# Patient Record
Sex: Female | Born: 1935 | Race: Black or African American | Hispanic: No | Smoking: Never smoker
Health system: Southern US, Community
[De-identification: ages and names within clinical notes are randomized; demographics above are authoritative.]

## PROBLEM LIST (undated history)

## (undated) DIAGNOSIS — D693 Immune thrombocytopenic purpura: Secondary | ICD-10-CM

## (undated) DIAGNOSIS — Z87440 Personal history of urinary (tract) infections: Secondary | ICD-10-CM

## (undated) DIAGNOSIS — I472 Ventricular tachycardia, unspecified: Secondary | ICD-10-CM

## (undated) DIAGNOSIS — I4892 Unspecified atrial flutter: Secondary | ICD-10-CM

## (undated) DIAGNOSIS — D696 Thrombocytopenia, unspecified: Secondary | ICD-10-CM

## (undated) DIAGNOSIS — N189 Chronic kidney disease, unspecified: Secondary | ICD-10-CM

## (undated) DIAGNOSIS — F0390 Unspecified dementia without behavioral disturbance: Secondary | ICD-10-CM

## (undated) DIAGNOSIS — R1310 Dysphagia, unspecified: Secondary | ICD-10-CM

## (undated) DIAGNOSIS — J069 Acute upper respiratory infection, unspecified: Secondary | ICD-10-CM

## (undated) DIAGNOSIS — K219 Gastro-esophageal reflux disease without esophagitis: Secondary | ICD-10-CM

## (undated) DIAGNOSIS — I2510 Atherosclerotic heart disease of native coronary artery without angina pectoris: Secondary | ICD-10-CM

## (undated) DIAGNOSIS — I079 Rheumatic tricuspid valve disease, unspecified: Secondary | ICD-10-CM

## (undated) DIAGNOSIS — I4510 Unspecified right bundle-branch block: Secondary | ICD-10-CM

## (undated) DIAGNOSIS — G459 Transient cerebral ischemic attack, unspecified: Secondary | ICD-10-CM

## (undated) DIAGNOSIS — I219 Acute myocardial infarction, unspecified: Secondary | ICD-10-CM

## (undated) DIAGNOSIS — M1990 Unspecified osteoarthritis, unspecified site: Secondary | ICD-10-CM

## (undated) DIAGNOSIS — J189 Pneumonia, unspecified organism: Secondary | ICD-10-CM

## (undated) DIAGNOSIS — E785 Hyperlipidemia, unspecified: Secondary | ICD-10-CM

## (undated) DIAGNOSIS — I5032 Chronic diastolic (congestive) heart failure: Secondary | ICD-10-CM

## (undated) DIAGNOSIS — R0902 Hypoxemia: Secondary | ICD-10-CM

## (undated) DIAGNOSIS — I495 Sick sinus syndrome: Secondary | ICD-10-CM

## (undated) DIAGNOSIS — I509 Heart failure, unspecified: Secondary | ICD-10-CM

## (undated) HISTORY — PX: BIOPSY THYROID: PRO38

## (undated) HISTORY — DX: Acute upper respiratory infection, unspecified: J06.9

## (undated) HISTORY — DX: Unspecified osteoarthritis, unspecified site: M19.90

## (undated) HISTORY — PX: CORONARY ANGIOPLASTY WITH STENT PLACEMENT: SHX49

## (undated) HISTORY — DX: Immune thrombocytopenic purpura: D69.3

## (undated) HISTORY — DX: Personal history of urinary (tract) infections: Z87.440

---

## 2001-06-13 ENCOUNTER — Emergency Department (HOSPITAL_COMMUNITY): Admission: EM | Admit: 2001-06-13 | Discharge: 2001-06-14 | Payer: Self-pay | Admitting: Emergency Medicine

## 2001-06-14 ENCOUNTER — Encounter: Payer: Self-pay | Admitting: Emergency Medicine

## 2002-04-21 ENCOUNTER — Encounter: Payer: Self-pay | Admitting: Family Medicine

## 2002-04-21 ENCOUNTER — Ambulatory Visit (HOSPITAL_COMMUNITY): Admission: RE | Admit: 2002-04-21 | Discharge: 2002-04-21 | Payer: Self-pay | Admitting: Family Medicine

## 2002-09-16 ENCOUNTER — Emergency Department (HOSPITAL_COMMUNITY): Admission: EM | Admit: 2002-09-16 | Discharge: 2002-09-16 | Payer: Self-pay | Admitting: Emergency Medicine

## 2002-09-16 ENCOUNTER — Encounter: Payer: Self-pay | Admitting: Emergency Medicine

## 2002-12-19 ENCOUNTER — Encounter: Payer: Self-pay | Admitting: Family Medicine

## 2002-12-19 ENCOUNTER — Ambulatory Visit (HOSPITAL_COMMUNITY): Admission: RE | Admit: 2002-12-19 | Discharge: 2002-12-19 | Payer: Self-pay | Admitting: Family Medicine

## 2003-09-24 ENCOUNTER — Ambulatory Visit (HOSPITAL_COMMUNITY): Admission: RE | Admit: 2003-09-24 | Discharge: 2003-09-24 | Payer: Self-pay | Admitting: Cardiology

## 2003-10-25 ENCOUNTER — Ambulatory Visit (HOSPITAL_COMMUNITY): Admission: RE | Admit: 2003-10-25 | Discharge: 2003-10-25 | Payer: Self-pay | Admitting: Cardiology

## 2003-11-12 ENCOUNTER — Inpatient Hospital Stay (HOSPITAL_COMMUNITY): Admission: EM | Admit: 2003-11-12 | Discharge: 2003-11-22 | Payer: Self-pay | Admitting: Emergency Medicine

## 2003-11-13 ENCOUNTER — Encounter: Payer: Self-pay | Admitting: Cardiology

## 2004-03-24 ENCOUNTER — Ambulatory Visit: Payer: Self-pay | Admitting: Cardiology

## 2004-10-28 ENCOUNTER — Ambulatory Visit: Payer: Self-pay | Admitting: Cardiology

## 2004-10-31 ENCOUNTER — Ambulatory Visit (HOSPITAL_COMMUNITY): Admission: RE | Admit: 2004-10-31 | Discharge: 2004-10-31 | Payer: Self-pay | Admitting: Cardiology

## 2004-10-31 ENCOUNTER — Ambulatory Visit: Payer: Self-pay | Admitting: Cardiology

## 2004-11-18 ENCOUNTER — Ambulatory Visit (HOSPITAL_COMMUNITY): Admission: RE | Admit: 2004-11-18 | Discharge: 2004-11-18 | Payer: Self-pay | Admitting: Cardiology

## 2004-11-18 ENCOUNTER — Ambulatory Visit: Payer: Self-pay | Admitting: *Deleted

## 2004-11-20 ENCOUNTER — Ambulatory Visit: Payer: Self-pay | Admitting: Cardiology

## 2004-11-28 ENCOUNTER — Ambulatory Visit (HOSPITAL_COMMUNITY): Admission: RE | Admit: 2004-11-28 | Discharge: 2004-11-28 | Payer: Self-pay | Admitting: Cardiology

## 2004-12-09 ENCOUNTER — Ambulatory Visit: Payer: Self-pay | Admitting: Cardiology

## 2005-01-08 ENCOUNTER — Emergency Department (HOSPITAL_COMMUNITY): Admission: EM | Admit: 2005-01-08 | Discharge: 2005-01-08 | Payer: Self-pay | Admitting: Emergency Medicine

## 2005-01-19 ENCOUNTER — Ambulatory Visit: Payer: Self-pay | Admitting: Cardiology

## 2005-01-27 ENCOUNTER — Encounter (HOSPITAL_COMMUNITY): Admission: RE | Admit: 2005-01-27 | Discharge: 2005-02-26 | Payer: Self-pay | Admitting: Oncology

## 2005-01-27 ENCOUNTER — Ambulatory Visit (HOSPITAL_COMMUNITY): Payer: Self-pay | Admitting: Oncology

## 2005-01-27 ENCOUNTER — Encounter: Admission: RE | Admit: 2005-01-27 | Discharge: 2005-01-27 | Payer: Self-pay | Admitting: Oncology

## 2005-02-12 ENCOUNTER — Ambulatory Visit: Payer: Self-pay | Admitting: Cardiology

## 2005-02-17 ENCOUNTER — Encounter (HOSPITAL_COMMUNITY): Admission: RE | Admit: 2005-02-17 | Discharge: 2005-03-19 | Payer: Self-pay

## 2005-02-17 ENCOUNTER — Ambulatory Visit: Payer: Self-pay | Admitting: Cardiology

## 2005-02-23 ENCOUNTER — Ambulatory Visit: Payer: Self-pay | Admitting: Cardiology

## 2005-02-27 ENCOUNTER — Encounter: Admission: RE | Admit: 2005-02-27 | Discharge: 2005-02-27 | Payer: Self-pay | Admitting: Oncology

## 2005-02-27 ENCOUNTER — Encounter (HOSPITAL_COMMUNITY): Admission: RE | Admit: 2005-02-27 | Discharge: 2005-03-29 | Payer: Self-pay | Admitting: Oncology

## 2005-03-18 ENCOUNTER — Ambulatory Visit (HOSPITAL_COMMUNITY): Payer: Self-pay | Admitting: Oncology

## 2005-04-29 ENCOUNTER — Encounter (HOSPITAL_COMMUNITY): Admission: RE | Admit: 2005-04-29 | Discharge: 2005-05-29 | Payer: Self-pay | Admitting: Oncology

## 2005-04-29 ENCOUNTER — Encounter: Admission: RE | Admit: 2005-04-29 | Discharge: 2005-04-29 | Payer: Self-pay | Admitting: Oncology

## 2005-05-08 ENCOUNTER — Ambulatory Visit (HOSPITAL_COMMUNITY): Payer: Self-pay | Admitting: Oncology

## 2005-06-02 ENCOUNTER — Ambulatory Visit: Payer: Self-pay | Admitting: Cardiology

## 2005-06-02 ENCOUNTER — Inpatient Hospital Stay (HOSPITAL_COMMUNITY): Admission: EM | Admit: 2005-06-02 | Discharge: 2005-06-04 | Payer: Self-pay | Admitting: Emergency Medicine

## 2005-06-02 ENCOUNTER — Encounter: Payer: Self-pay | Admitting: Emergency Medicine

## 2005-06-18 ENCOUNTER — Ambulatory Visit: Payer: Self-pay | Admitting: Cardiology

## 2005-07-08 ENCOUNTER — Encounter (HOSPITAL_COMMUNITY): Admission: RE | Admit: 2005-07-08 | Discharge: 2005-08-07 | Payer: Self-pay | Admitting: Oncology

## 2005-07-08 ENCOUNTER — Encounter: Admission: RE | Admit: 2005-07-08 | Discharge: 2005-07-08 | Payer: Self-pay | Admitting: Oncology

## 2005-10-06 ENCOUNTER — Ambulatory Visit (HOSPITAL_COMMUNITY): Payer: Self-pay | Admitting: Oncology

## 2005-10-06 ENCOUNTER — Encounter (HOSPITAL_COMMUNITY): Admission: RE | Admit: 2005-10-06 | Discharge: 2005-11-05 | Payer: Self-pay | Admitting: Oncology

## 2005-10-06 ENCOUNTER — Encounter: Admission: RE | Admit: 2005-10-06 | Discharge: 2005-10-06 | Payer: Self-pay | Admitting: Oncology

## 2005-12-03 ENCOUNTER — Inpatient Hospital Stay (HOSPITAL_COMMUNITY): Admission: EM | Admit: 2005-12-03 | Discharge: 2005-12-11 | Payer: Self-pay | Admitting: Emergency Medicine

## 2005-12-04 ENCOUNTER — Ambulatory Visit: Payer: Self-pay | Admitting: *Deleted

## 2005-12-05 ENCOUNTER — Ambulatory Visit: Payer: Self-pay | Admitting: *Deleted

## 2005-12-05 ENCOUNTER — Encounter: Payer: Self-pay | Admitting: Cardiology

## 2005-12-06 ENCOUNTER — Encounter: Payer: Self-pay | Admitting: Cardiology

## 2005-12-16 ENCOUNTER — Ambulatory Visit: Payer: Self-pay | Admitting: Cardiology

## 2006-01-06 ENCOUNTER — Ambulatory Visit: Payer: Self-pay | Admitting: Cardiology

## 2006-02-02 ENCOUNTER — Encounter (HOSPITAL_COMMUNITY): Admission: RE | Admit: 2006-02-02 | Discharge: 2006-03-04 | Payer: Self-pay | Admitting: Oncology

## 2006-02-02 ENCOUNTER — Ambulatory Visit (HOSPITAL_COMMUNITY): Payer: Self-pay | Admitting: Oncology

## 2006-02-02 ENCOUNTER — Encounter: Admission: RE | Admit: 2006-02-02 | Discharge: 2006-02-02 | Payer: Self-pay | Admitting: Oncology

## 2006-03-05 ENCOUNTER — Ambulatory Visit: Payer: Self-pay | Admitting: Cardiology

## 2006-03-15 ENCOUNTER — Emergency Department (HOSPITAL_COMMUNITY): Admission: EM | Admit: 2006-03-15 | Discharge: 2006-03-15 | Payer: Self-pay | Admitting: Emergency Medicine

## 2006-03-24 ENCOUNTER — Ambulatory Visit (HOSPITAL_COMMUNITY): Admission: RE | Admit: 2006-03-24 | Discharge: 2006-03-24 | Payer: Self-pay | Admitting: Family Medicine

## 2006-05-14 ENCOUNTER — Ambulatory Visit: Payer: Self-pay | Admitting: Cardiology

## 2006-05-17 ENCOUNTER — Ambulatory Visit: Payer: Self-pay | Admitting: Orthopedic Surgery

## 2006-08-11 ENCOUNTER — Ambulatory Visit (HOSPITAL_COMMUNITY): Payer: Self-pay | Admitting: Oncology

## 2006-08-11 ENCOUNTER — Encounter (HOSPITAL_COMMUNITY): Admission: RE | Admit: 2006-08-11 | Discharge: 2006-09-10 | Payer: Self-pay | Admitting: Oncology

## 2006-12-31 ENCOUNTER — Encounter: Payer: Self-pay | Admitting: Emergency Medicine

## 2007-01-01 ENCOUNTER — Inpatient Hospital Stay (HOSPITAL_COMMUNITY): Admission: EM | Admit: 2007-01-01 | Discharge: 2007-01-08 | Payer: Self-pay | Admitting: Internal Medicine

## 2007-01-01 ENCOUNTER — Ambulatory Visit: Payer: Self-pay | Admitting: Internal Medicine

## 2007-01-03 ENCOUNTER — Encounter: Payer: Self-pay | Admitting: Internal Medicine

## 2007-01-12 ENCOUNTER — Ambulatory Visit: Payer: Self-pay | Admitting: Cardiology

## 2007-02-11 ENCOUNTER — Encounter (HOSPITAL_COMMUNITY): Admission: RE | Admit: 2007-02-11 | Discharge: 2007-03-13 | Payer: Self-pay | Admitting: Oncology

## 2007-02-11 ENCOUNTER — Ambulatory Visit (HOSPITAL_COMMUNITY): Payer: Self-pay | Admitting: Oncology

## 2007-02-12 IMAGING — CR DG CHEST 2V
2 series · 2 of 2 positions shown · non-contrast
Comparison: 12/03/05.
 CHEST - 2 VIEW:

CLINICAL DATA: Cough and congestion.  Hypertension.

[view not recorded (1 of 2)]
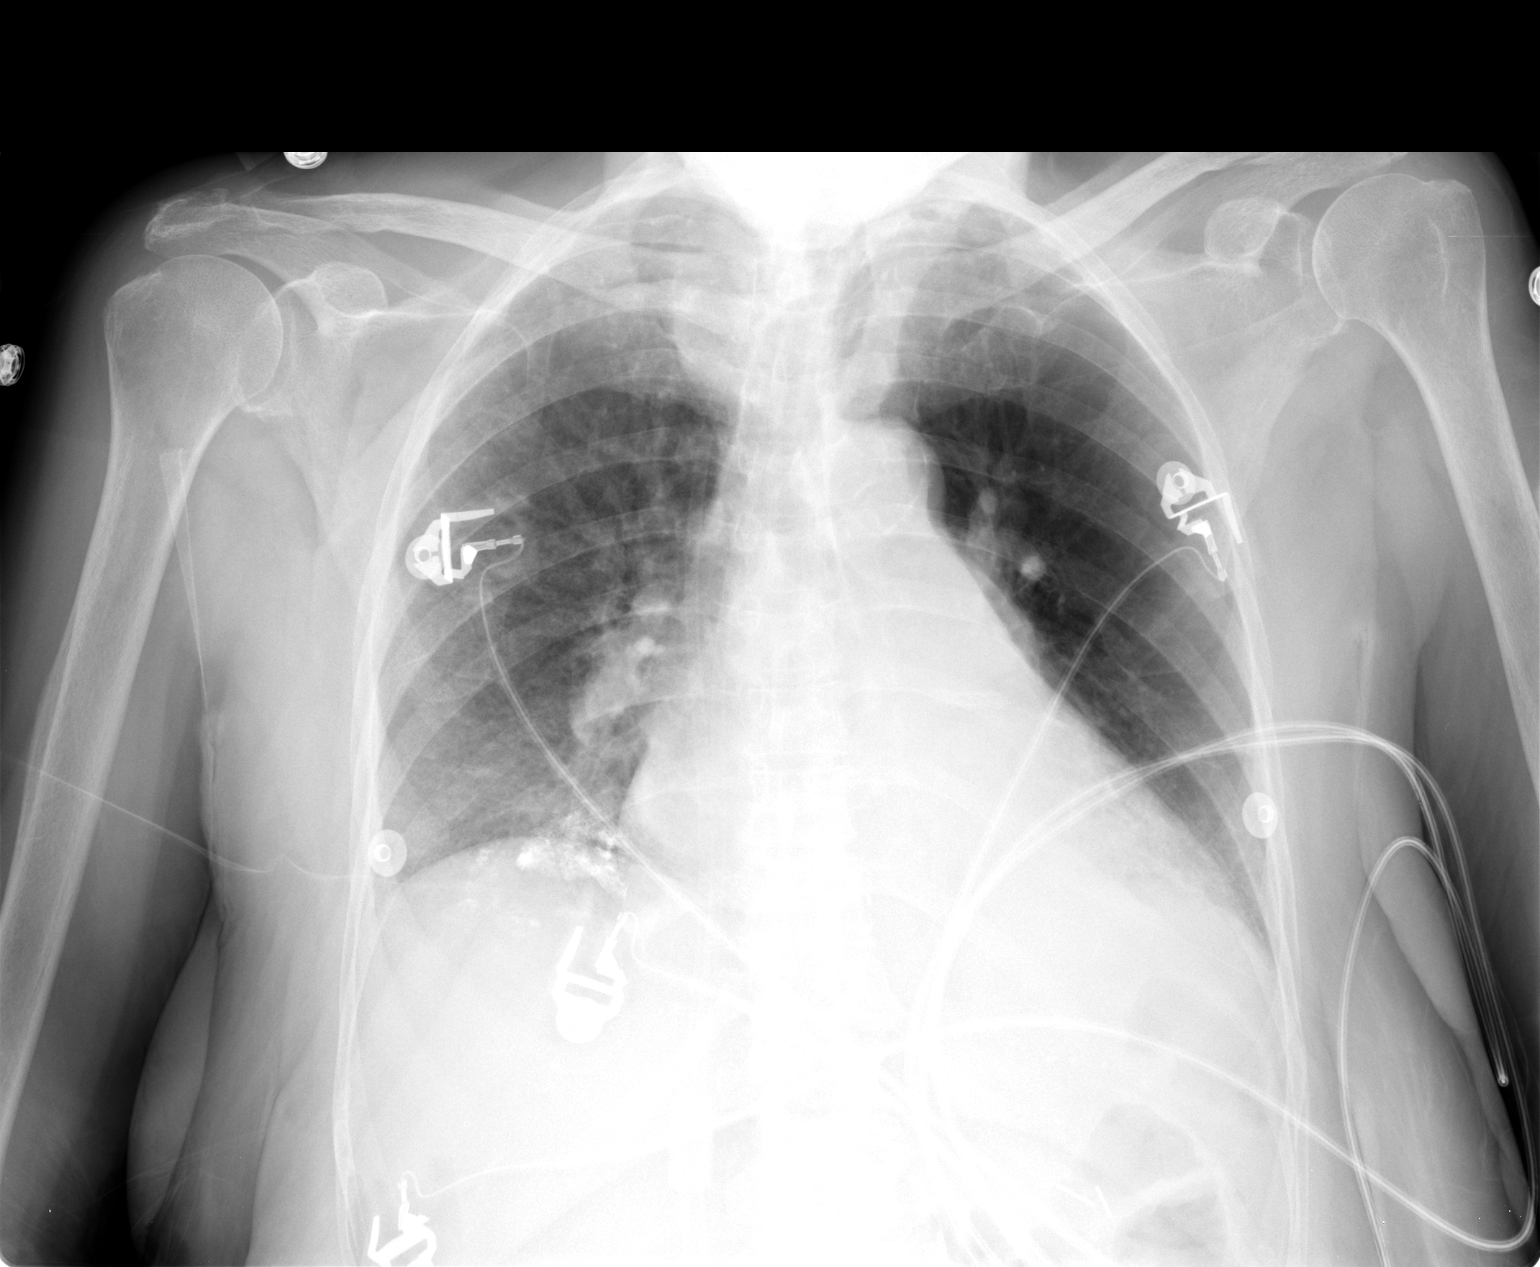

[view not recorded (2 of 2)]
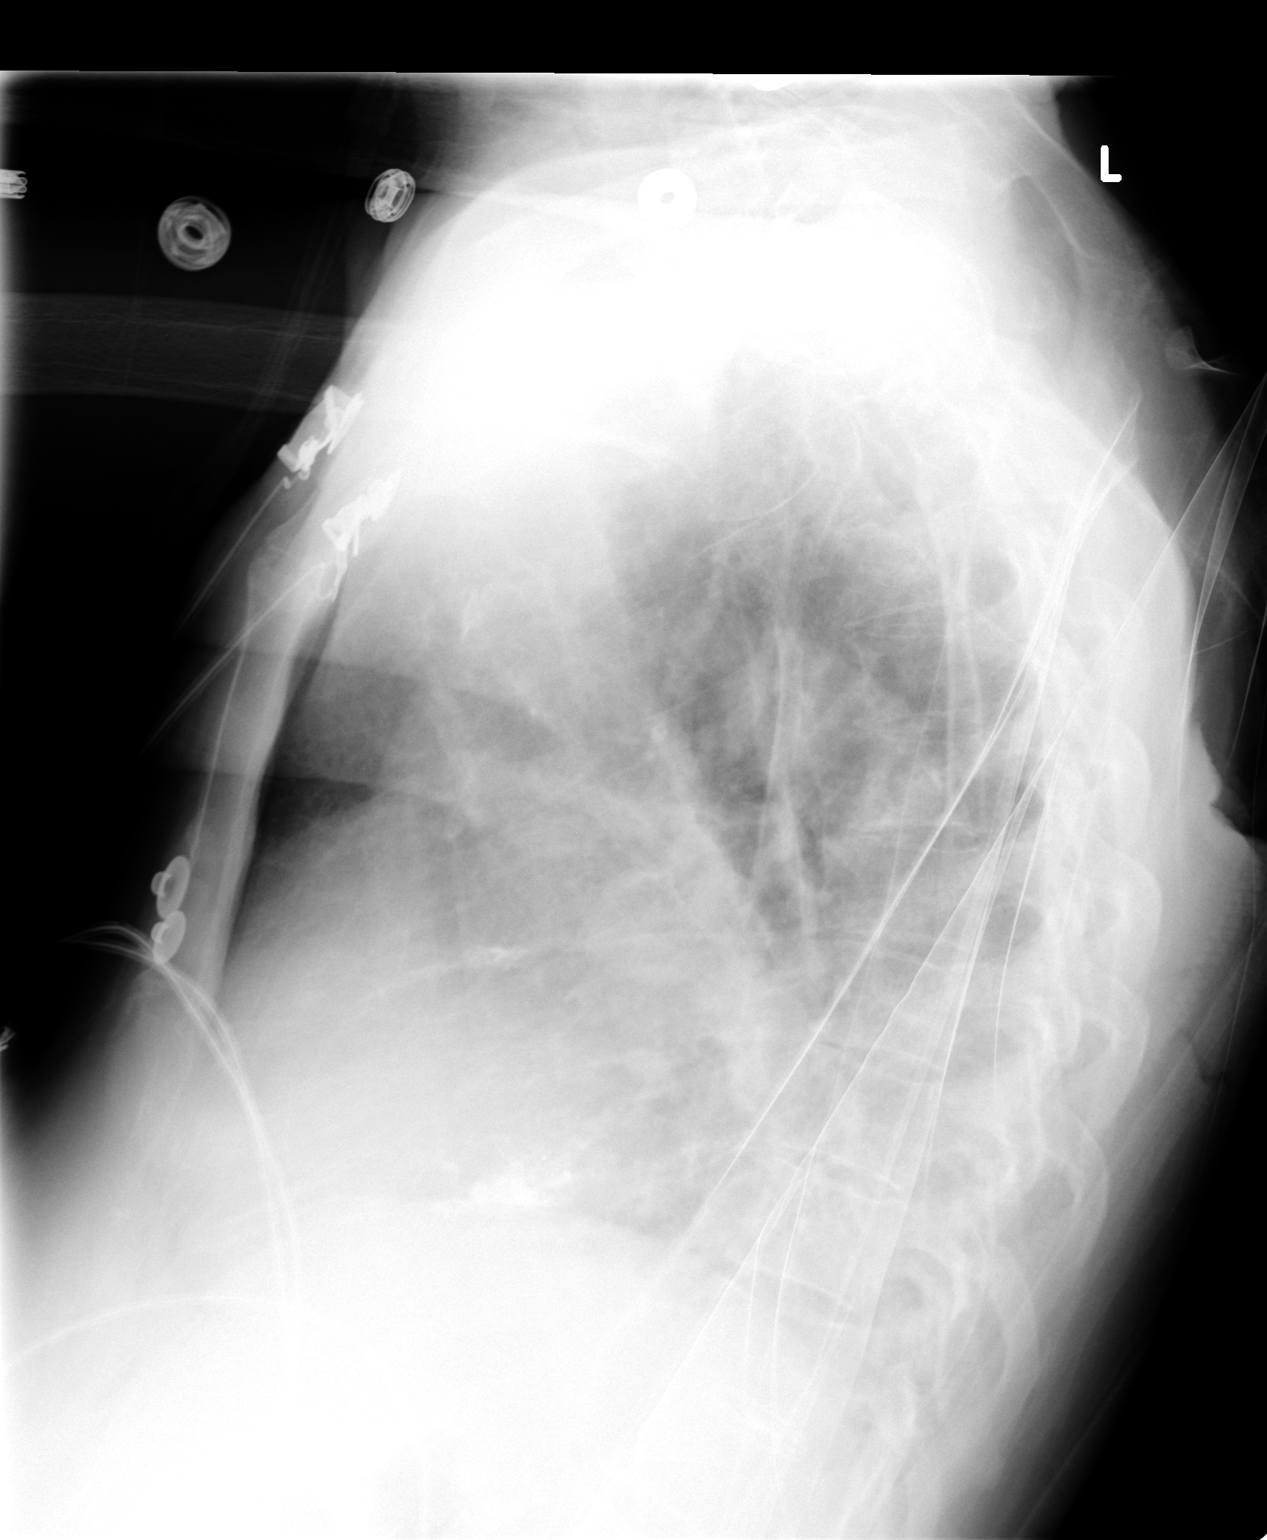

[2 of 2 positions shown; findings below may reference images not displayed]

FINDINGS: Somewhat limited views due to patient?s clinical status, especially on the lateral view.  Patient rotated leftward.  Moderate cardiomegaly.  Mild pulmonary venous congestion.  No overt congestive failure.  There is developing opacity at the left lung base, obscuring a portion of the left heart border.   There is also increased density at the right lung base superimposed upon the aspirated barium described previously.
IMPRESSION: Decreased aeration.  Development of mild pulmonary venous congestion and bibasilar air space disease.  Latter findings could relate to atelectasis or infection.

## 2007-02-15 IMAGING — CR DG CHEST 1V PORT
1 series · 1 of 1 positions shown · non-contrast
Comparison: 12/06/05.

CLINICAL DATA: Chest pain.  
 PORTABLE CHEST ? 1 VIEW ? 12/09/05 ? 8505 HOURS:

[view not recorded]
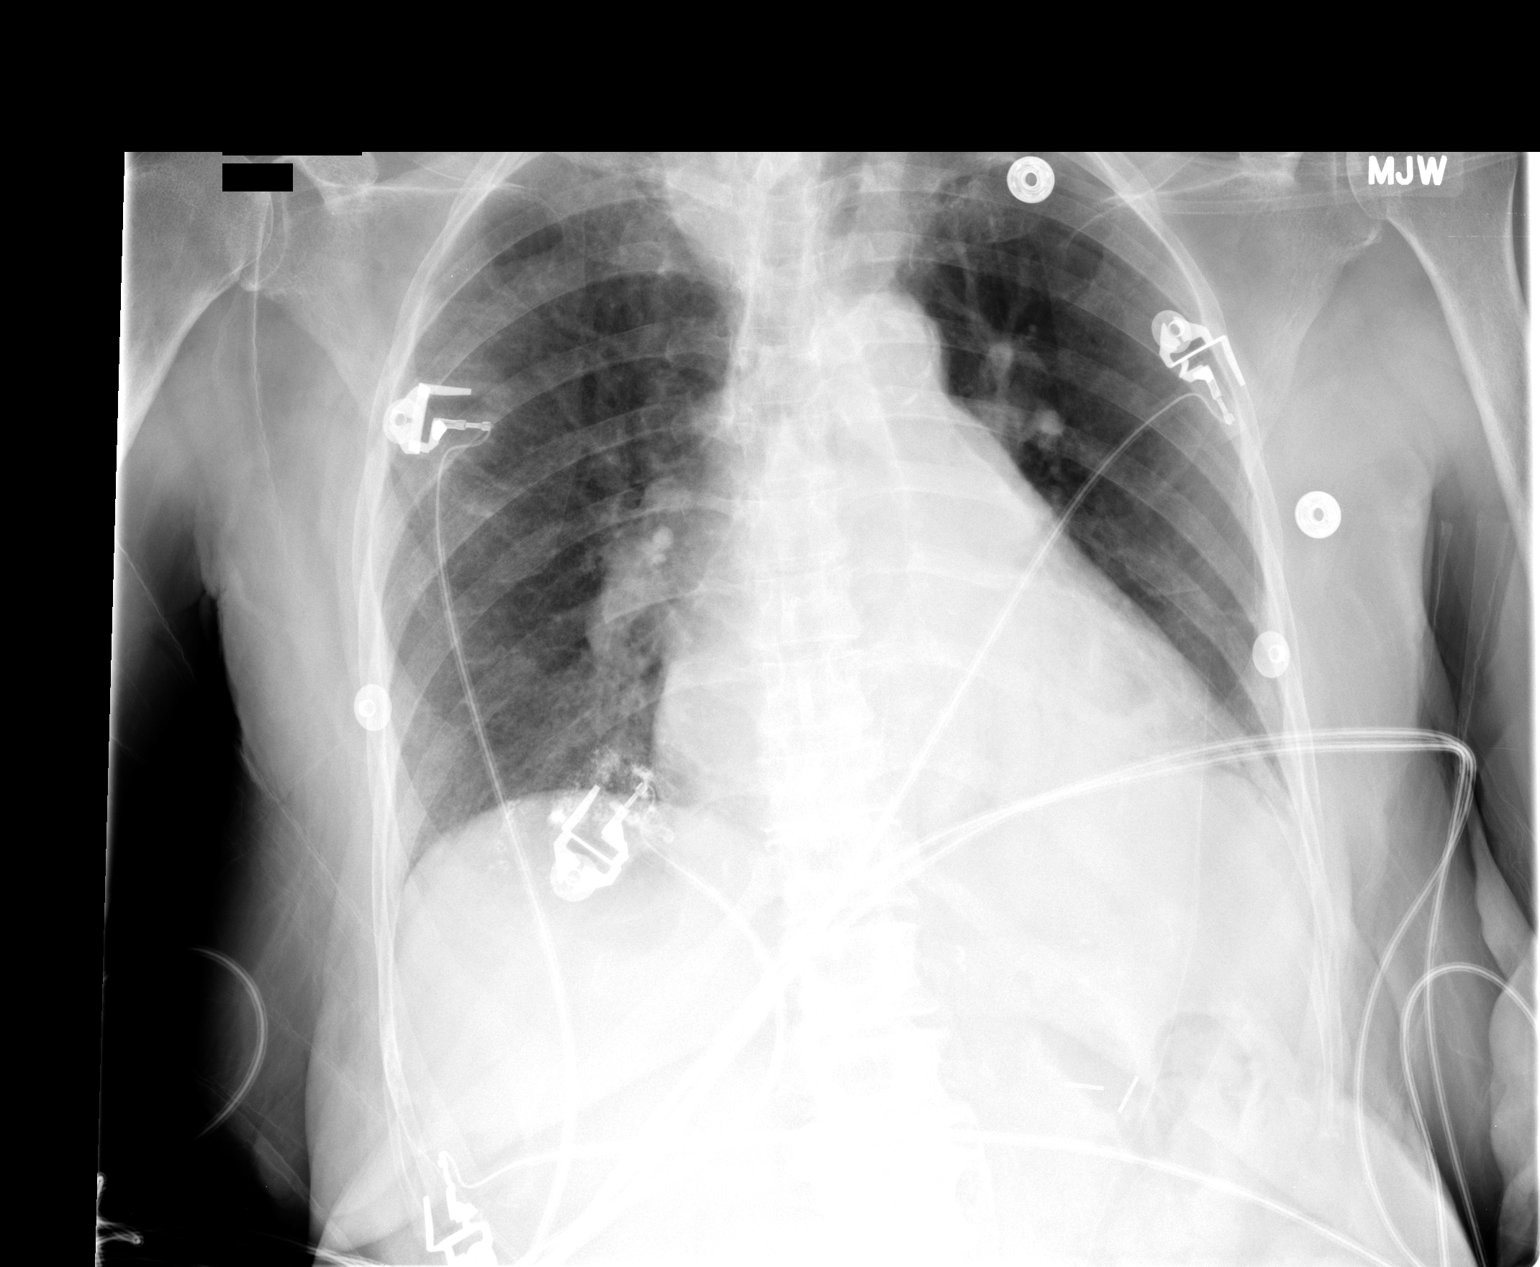

[1 of 1 positions shown; findings below may reference images not displayed]

FINDINGS: Aeration has improved.  Patchy opacity remains at the left base, which may represent atelectasis or pneumonia.  Cardiomegaly is stable.  Prominence of the superior mediastinal soft tissues again is noted most consistent with substernal thyroid.
IMPRESSION: 1.  Improved aeration.  
 2.  Opacity remains at the left base.  Consider atelectasis or pneumonia.  
 3.  Suspect thyroid goiter.

## 2007-02-23 ENCOUNTER — Ambulatory Visit: Payer: Self-pay | Admitting: Cardiology

## 2007-07-15 ENCOUNTER — Ambulatory Visit: Payer: Self-pay | Admitting: Cardiology

## 2007-07-15 ENCOUNTER — Ambulatory Visit (HOSPITAL_COMMUNITY): Admission: RE | Admit: 2007-07-15 | Discharge: 2007-07-15 | Payer: Self-pay | Admitting: Cardiology

## 2007-08-29 ENCOUNTER — Ambulatory Visit (HOSPITAL_COMMUNITY): Payer: Self-pay | Admitting: Oncology

## 2007-08-29 ENCOUNTER — Encounter (HOSPITAL_COMMUNITY): Admission: RE | Admit: 2007-08-29 | Discharge: 2007-09-28 | Payer: Self-pay | Admitting: Oncology

## 2007-09-15 ENCOUNTER — Ambulatory Visit (HOSPITAL_COMMUNITY): Admission: RE | Admit: 2007-09-15 | Discharge: 2007-09-15 | Payer: Self-pay | Admitting: Family Medicine

## 2008-02-15 ENCOUNTER — Ambulatory Visit (HOSPITAL_COMMUNITY): Admission: RE | Admit: 2008-02-15 | Discharge: 2008-02-15 | Payer: Self-pay | Admitting: Internal Medicine

## 2008-02-22 ENCOUNTER — Ambulatory Visit (HOSPITAL_COMMUNITY): Admission: RE | Admit: 2008-02-22 | Discharge: 2008-02-22 | Payer: Self-pay | Admitting: Internal Medicine

## 2008-07-16 ENCOUNTER — Encounter (HOSPITAL_COMMUNITY): Admission: RE | Admit: 2008-07-16 | Discharge: 2008-08-16 | Payer: Self-pay | Admitting: Oncology

## 2008-07-16 ENCOUNTER — Ambulatory Visit (HOSPITAL_COMMUNITY): Payer: Self-pay | Admitting: Oncology

## 2008-09-20 IMAGING — US US EXTREM LOW VENOUS*R*
1 series · 14 of 24 positions shown · non-contrast
Comparison: None

CLINICAL DATA: Right lower extremity pain and edema



[Series 1: rle venous · portal-venous · 14 of 25 slices shown]
[im 1/25]
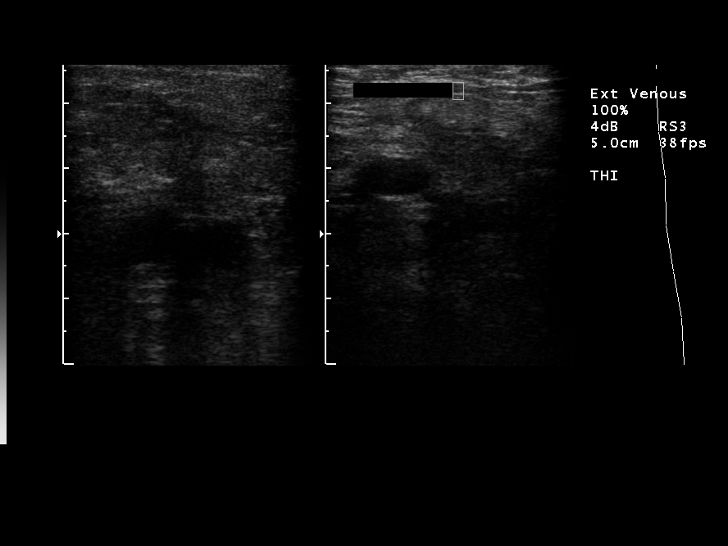
[im 3/25]
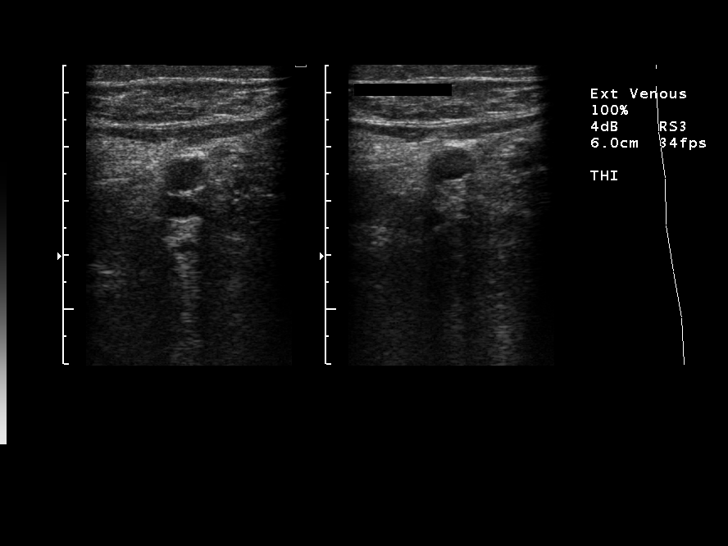
[im 5/25]
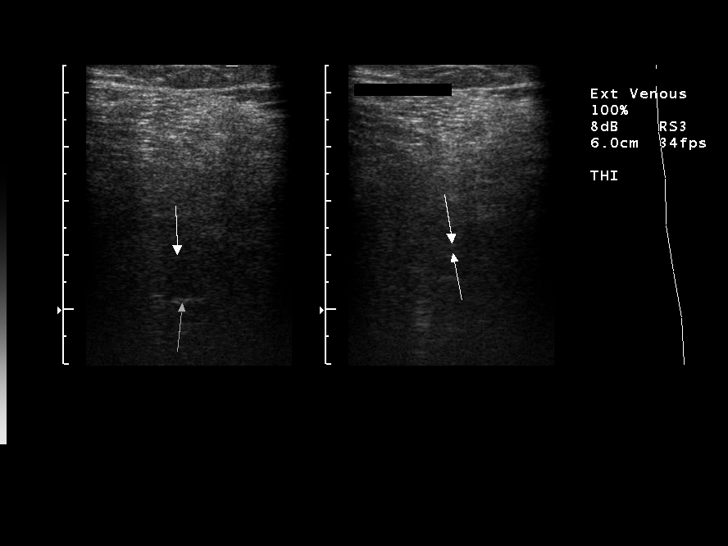
[im 7/25]
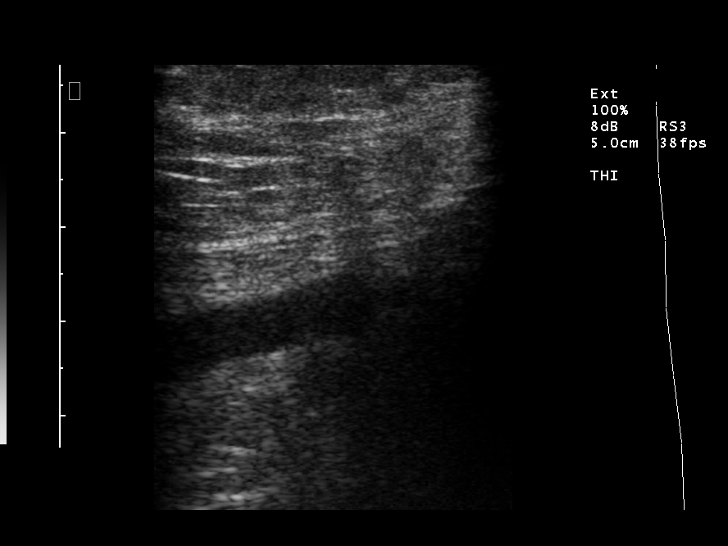
[im 8/25]
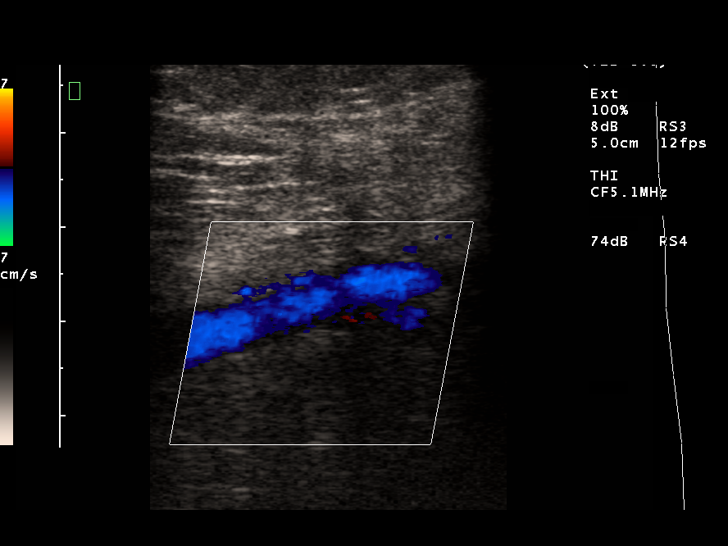
[im 10/25]
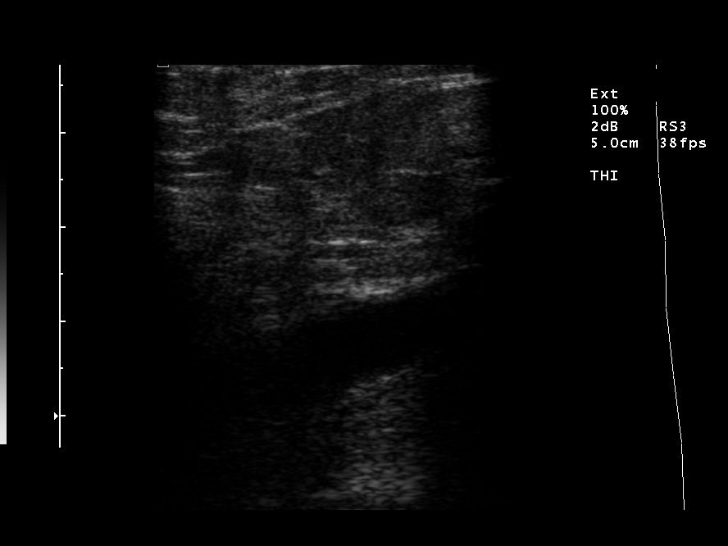
[im 12/25]
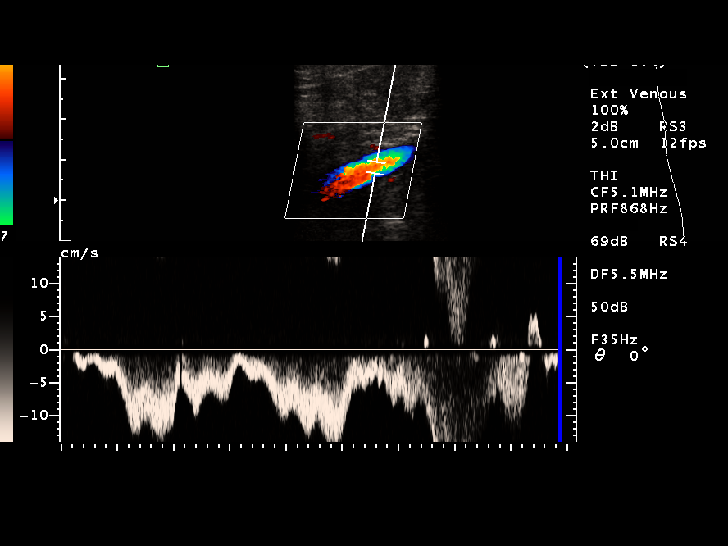
[im 13/25]
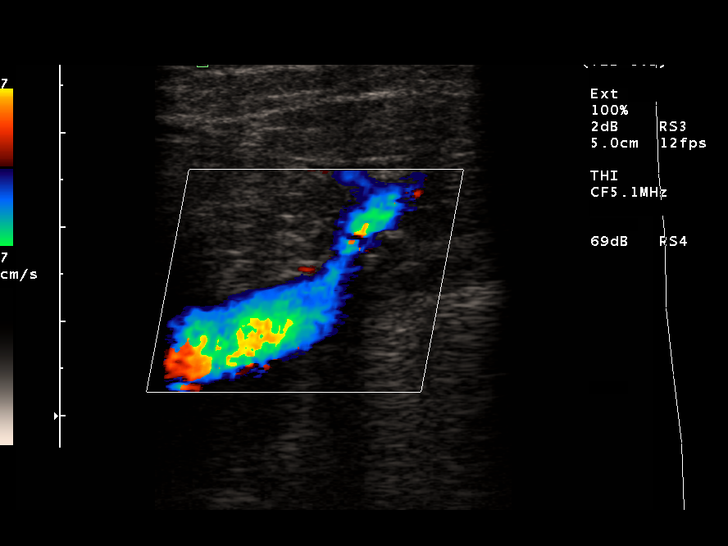
[im 15/25]
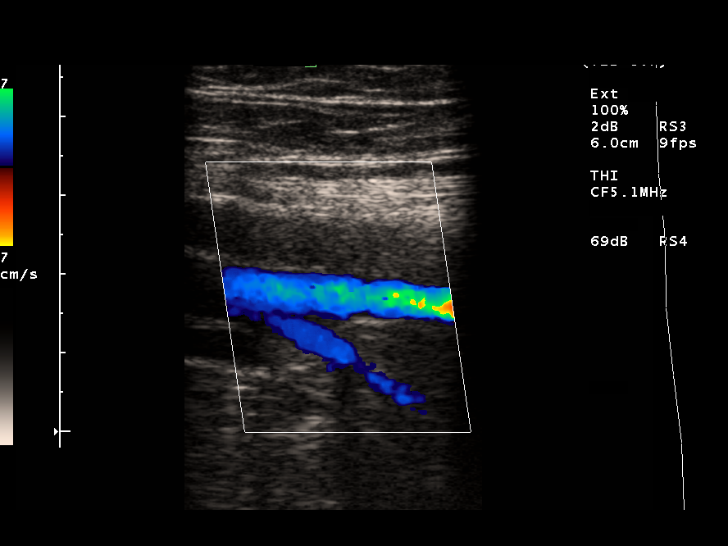
[im 17/25]
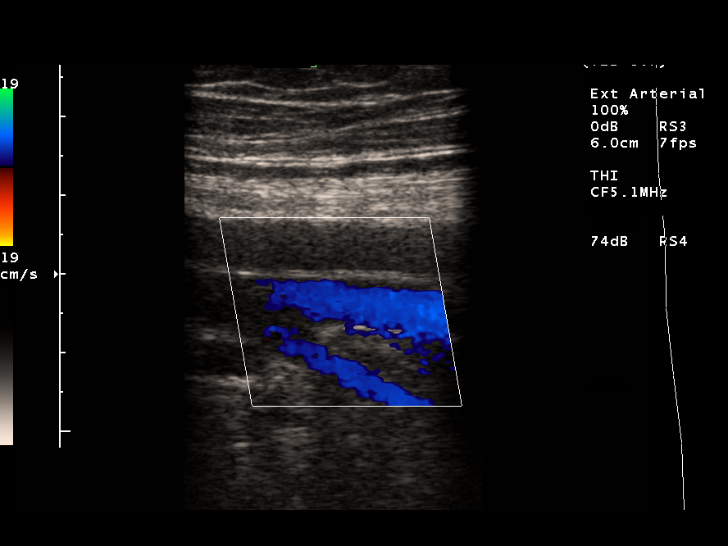
[im 19/25]
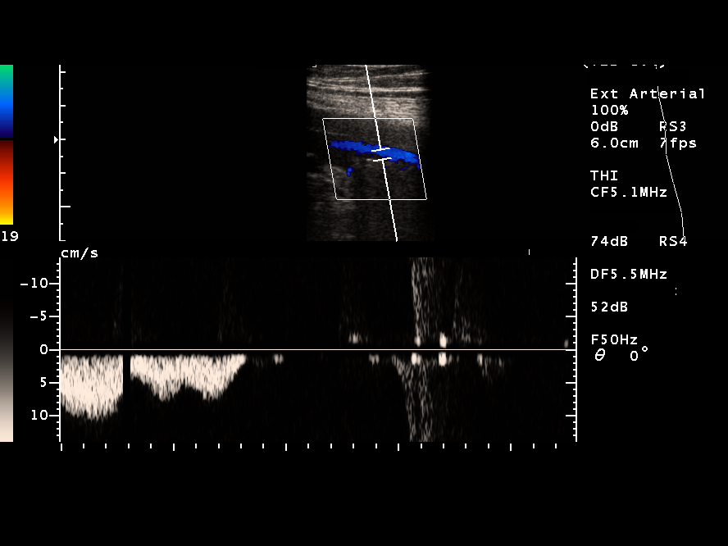
[im 20/25]
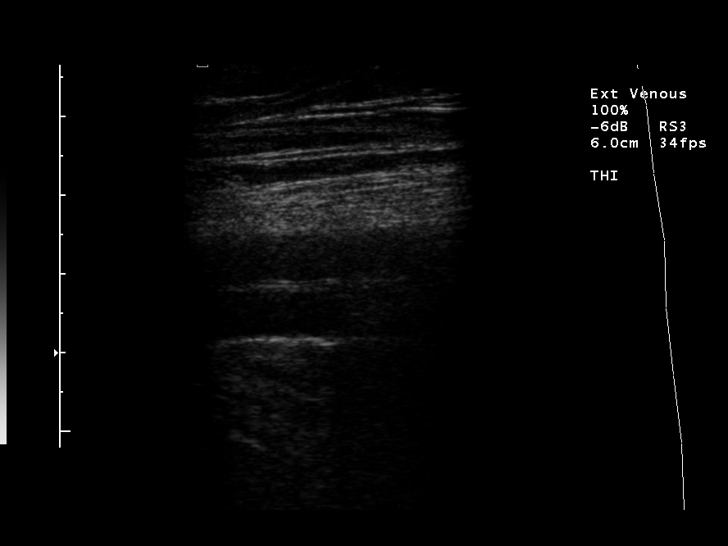
[im 22/25]
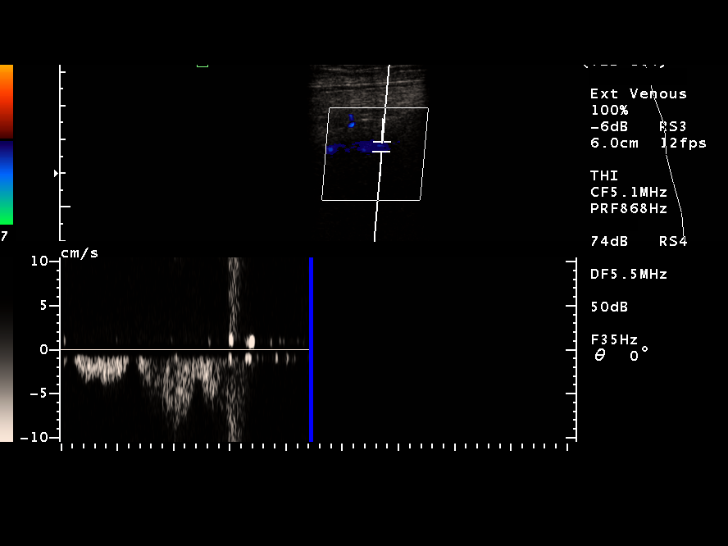
[im 25/25]
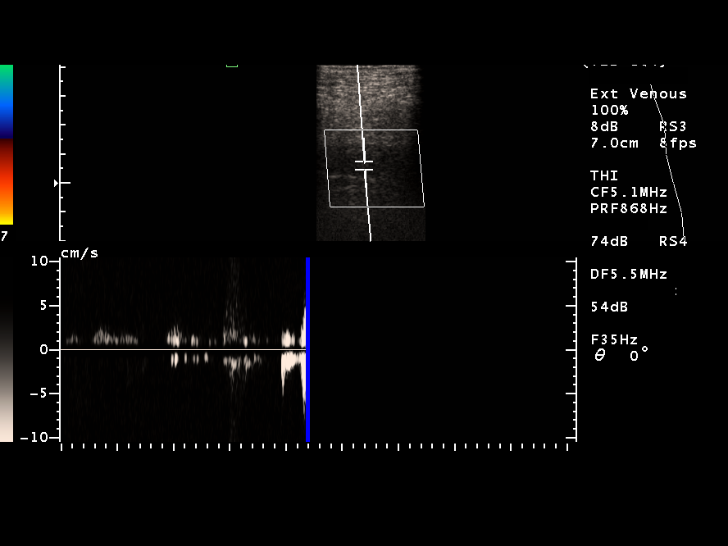

[14 of 24 positions shown; findings below may reference images not displayed]

FINDINGS: Deep veins of the right lower extremity demonstrate no
evidence of thrombus or abnormal flow.  Compressed segments show
normal compressibility.  Normal phasicity and augmentation present.
IMPRESSION: No evidence of right lower extremity deep venous thrombosis.

## 2008-11-01 ENCOUNTER — Encounter (INDEPENDENT_AMBULATORY_CARE_PROVIDER_SITE_OTHER): Payer: Self-pay | Admitting: *Deleted

## 2009-07-15 ENCOUNTER — Ambulatory Visit (HOSPITAL_COMMUNITY): Payer: Self-pay | Admitting: Oncology

## 2009-07-15 ENCOUNTER — Encounter (HOSPITAL_COMMUNITY): Admission: RE | Admit: 2009-07-15 | Discharge: 2009-08-14 | Payer: Self-pay | Admitting: Oncology

## 2009-07-26 ENCOUNTER — Encounter: Payer: Self-pay | Admitting: Cardiology

## 2009-09-29 ENCOUNTER — Inpatient Hospital Stay (HOSPITAL_COMMUNITY): Admission: EM | Admit: 2009-09-29 | Discharge: 2009-10-04 | Payer: Self-pay | Admitting: Emergency Medicine

## 2009-11-01 ENCOUNTER — Ambulatory Visit (HOSPITAL_COMMUNITY): Admission: RE | Admit: 2009-11-01 | Discharge: 2009-11-01 | Payer: Self-pay | Admitting: Internal Medicine

## 2009-11-01 ENCOUNTER — Other Ambulatory Visit: Admission: RE | Admit: 2009-11-01 | Discharge: 2009-11-01 | Payer: Self-pay | Admitting: Diagnostic Radiology

## 2009-12-25 ENCOUNTER — Ambulatory Visit: Payer: Self-pay | Admitting: Oncology

## 2009-12-25 ENCOUNTER — Encounter: Payer: Self-pay | Admitting: Cardiology

## 2009-12-26 ENCOUNTER — Inpatient Hospital Stay (HOSPITAL_COMMUNITY): Admission: EM | Admit: 2009-12-26 | Discharge: 2009-12-26 | Payer: Self-pay | Admitting: Emergency Medicine

## 2010-03-05 ENCOUNTER — Telehealth (INDEPENDENT_AMBULATORY_CARE_PROVIDER_SITE_OTHER): Payer: Self-pay

## 2010-03-27 ENCOUNTER — Ambulatory Visit: Payer: Self-pay | Admitting: Otolaryngology

## 2010-04-01 ENCOUNTER — Inpatient Hospital Stay (HOSPITAL_COMMUNITY): Admission: EM | Admit: 2010-04-01 | Discharge: 2010-04-04 | Payer: Self-pay | Source: Home / Self Care

## 2010-04-24 ENCOUNTER — Ambulatory Visit
Admission: RE | Admit: 2010-04-24 | Discharge: 2010-04-24 | Payer: Self-pay | Source: Home / Self Care | Attending: Otolaryngology | Admitting: Otolaryngology

## 2010-05-05 ENCOUNTER — Ambulatory Visit (HOSPITAL_COMMUNITY)
Admission: RE | Admit: 2010-05-05 | Discharge: 2010-05-05 | Payer: Self-pay | Source: Home / Self Care | Attending: Family Medicine | Admitting: Family Medicine

## 2010-05-06 ENCOUNTER — Emergency Department (HOSPITAL_COMMUNITY)
Admission: EM | Admit: 2010-05-06 | Discharge: 2010-05-07 | Disposition: A | Discharge status: Other Acute Inpatient Hospital | Payer: Self-pay | Source: Home / Self Care | Admitting: Emergency Medicine

## 2010-05-07 ENCOUNTER — Inpatient Hospital Stay (HOSPITAL_COMMUNITY)
Admission: EM | Admit: 2010-05-07 | Discharge: 2010-05-16 | Payer: Self-pay | Attending: Cardiovascular Disease | Admitting: Cardiovascular Disease

## 2010-05-07 LAB — CBC
HCT: 33.9 % — ABNORMAL LOW (ref 36.0–46.0)
Hemoglobin: 11.2 g/dL — ABNORMAL LOW (ref 12.0–15.0)
MCH: 27.7 pg (ref 26.0–34.0)
MCHC: 33 g/dL (ref 30.0–36.0)
MCV: 83.9 fL (ref 78.0–100.0)
Platelets: 90 10*3/uL — ABNORMAL LOW (ref 150–400)
RBC: 4.04 MIL/uL (ref 3.87–5.11)
RDW: 15.5 % (ref 11.5–15.5)
WBC: 6.6 10*3/uL (ref 4.0–10.5)

## 2010-05-10 ENCOUNTER — Encounter: Payer: Self-pay | Admitting: Cardiology

## 2010-05-11 ENCOUNTER — Encounter: Payer: Self-pay | Admitting: Internal Medicine

## 2010-05-11 ENCOUNTER — Encounter: Payer: Self-pay | Admitting: Orthopaedic Surgery

## 2010-05-12 LAB — BASIC METABOLIC PANEL
CO2: 20 mEq/L (ref 19–32)
CO2: 20 mEq/L (ref 19–32)
Calcium: 9.4 mg/dL (ref 8.4–10.5)
Chloride: 114 mEq/L — ABNORMAL HIGH (ref 96–112)
Chloride: 116 mEq/L — ABNORMAL HIGH (ref 96–112)
Creatinine, Ser: 2.07 mg/dL — ABNORMAL HIGH (ref 0.4–1.2)
Creatinine, Ser: 1.96 mg/dL — ABNORMAL HIGH (ref 0.4–1.2)
GFR calc Af Amer: 29 mL/min — ABNORMAL LOW (ref >60)
Sodium: 144 mEq/L (ref 135–145)

## 2010-05-12 LAB — CARDIAC PANEL(CRET KIN+CKTOT+MB+TROPI)
CK, MB: 6.7 ng/mL (ref 0.3–4.0)
CK, MB: 5.9 ng/mL — ABNORMAL HIGH (ref 0.3–4.0)
Troponin I: 0.6 ng/mL (ref 0.00–0.06)
Troponin I: 0.42 ng/mL — ABNORMAL HIGH (ref 0.00–0.06)

## 2010-05-12 LAB — HEMOGLOBIN A1C: Mean Plasma Glucose: 143 mg/dL — ABNORMAL HIGH (ref <117)

## 2010-05-12 LAB — POCT CARDIAC MARKERS
CKMB, poc: 2 ng/mL (ref 1.0–8.0)
CKMB, poc: 2.7 ng/mL (ref 1.0–8.0)
Myoglobin, poc: 106 ng/mL (ref 12–200)
Myoglobin, poc: 129 ng/mL (ref 12–200)
Troponin i, poc: <0.05 ng/mL (ref 0.00–0.09)
Troponin i, poc: 0.17 ng/mL — ABNORMAL HIGH (ref 0.00–0.09)

## 2010-05-12 LAB — LIPID PANEL
HDL: 40 mg/dL (ref >39)
Total CHOL/HDL Ratio: 2.3 RATIO
Triglycerides: 41 mg/dL (ref <150)
VLDL: 8 mg/dL (ref 0–40)

## 2010-05-12 LAB — DIFFERENTIAL
Basophils Absolute: 0 10*3/uL (ref 0.0–0.1)
Basophils Relative: 1 % (ref 0–1)
Eosinophils Absolute: 0.4 10*3/uL (ref 0.0–0.7)
Eosinophils Relative: 5 % (ref 0–5)
Lymphocytes Relative: 11 % — ABNORMAL LOW (ref 12–46)
Lymphs Abs: 0.7 10*3/uL (ref 0.7–4.0)
Monocytes Absolute: 0.7 10*3/uL (ref 0.1–1.0)
Monocytes Relative: 11 % (ref 3–12)
Neutro Abs: 4.8 10*3/uL (ref 1.7–7.7)
Neutrophils Relative %: 73 % (ref 43–77)

## 2010-05-12 LAB — PROTIME-INR: INR: 1.18 (ref 0.00–1.49)

## 2010-05-12 LAB — CBC
HCT: 34.2 % — ABNORMAL LOW (ref 36.0–46.0)
HCT: 33.7 % — ABNORMAL LOW (ref 36.0–46.0)
Hemoglobin: 11 g/dL — ABNORMAL LOW (ref 12.0–15.0)
Hemoglobin: 10.8 g/dL — ABNORMAL LOW (ref 12.0–15.0)
MCHC: 32.5 g/dL (ref 30.0–36.0)
MCV: 83.6 fL (ref 78.0–100.0)
MCV: 83.6 fL (ref 78.0–100.0)
Platelets: 79 10*3/uL — ABNORMAL LOW (ref 150–400)
RBC: 4.03 MIL/uL (ref 3.87–5.11)
RBC: 3.96 MIL/uL (ref 3.87–5.11)
RDW: 15.6 % — ABNORMAL HIGH (ref 11.5–15.5)
RDW: 15.5 % (ref 11.5–15.5)
WBC: 6.5 10*3/uL (ref 4.0–10.5)
WBC: 5.4 10*3/uL (ref 4.0–10.5)
WBC: 4.6 10*3/uL (ref 4.0–10.5)

## 2010-05-12 LAB — MRSA PCR SCREENING: MRSA by PCR: NEGATIVE

## 2010-05-12 LAB — TROPONIN I: Troponin I: 0.16 ng/mL — ABNORMAL HIGH (ref 0.00–0.06)

## 2010-05-12 LAB — MAGNESIUM: Magnesium: 2.1 mg/dL (ref 1.5–2.5)

## 2010-05-12 LAB — CK TOTAL AND CKMB (NOT AT ARMC): Total CK: 33 U/L (ref 7–177)

## 2010-05-12 LAB — HEPARIN LEVEL (UNFRACTIONATED)
Heparin Unfractionated: 0.32 IU/mL (ref 0.30–0.70)
Heparin Unfractionated: 0.27 IU/mL — ABNORMAL LOW (ref 0.30–0.70)
Heparin Unfractionated: 0.38 IU/mL (ref 0.30–0.70)

## 2010-05-13 LAB — COMPREHENSIVE METABOLIC PANEL
ALT: 17 U/L (ref 0–35)
AST: 22 U/L (ref 0–37)
Albumin: 3.1 g/dL — ABNORMAL LOW (ref 3.5–5.2)
Albumin: 2.9 g/dL — ABNORMAL LOW (ref 3.5–5.2)
Alkaline Phosphatase: 57 U/L (ref 39–117)
BUN: 17 mg/dL (ref 6–23)
Chloride: 114 mEq/L — ABNORMAL HIGH (ref 96–112)
Chloride: 111 mEq/L (ref 96–112)
Creatinine, Ser: 1.64 mg/dL — ABNORMAL HIGH (ref 0.4–1.2)
Creatinine, Ser: 1.66 mg/dL — ABNORMAL HIGH (ref 0.4–1.2)
GFR calc Af Amer: 36 mL/min — ABNORMAL LOW (ref >60)
GFR calc non Af Amer: 29 mL/min — ABNORMAL LOW (ref >60)
Potassium: 4.6 mEq/L (ref 3.5–5.1)
Sodium: 142 mEq/L (ref 135–145)
Total Bilirubin: 0.5 mg/dL (ref 0.3–1.2)
Total Bilirubin: 0.4 mg/dL (ref 0.3–1.2)

## 2010-05-13 LAB — CBC
MCH: 27.6 pg (ref 26.0–34.0)
MCHC: 32.9 g/dL (ref 30.0–36.0)
MCV: 84.7 fL (ref 78.0–100.0)
Platelets: 90 10*3/uL — ABNORMAL LOW (ref 150–400)
Platelets: 83 10*3/uL — ABNORMAL LOW (ref 150–400)
RBC: 4.84 MIL/uL (ref 3.87–5.11)
RBC: 4.38 MIL/uL (ref 3.87–5.11)
RDW: 15.7 % — ABNORMAL HIGH (ref 11.5–15.5)
WBC: 4.4 10*3/uL (ref 4.0–10.5)
WBC: 6.9 10*3/uL (ref 4.0–10.5)

## 2010-05-13 LAB — BASIC METABOLIC PANEL
BUN: 18 mg/dL (ref 6–23)
Creatinine, Ser: 1.67 mg/dL — ABNORMAL HIGH (ref 0.4–1.2)
GFR calc Af Amer: 35 mL/min — ABNORMAL LOW (ref >60)
GFR calc non Af Amer: 29 mL/min — ABNORMAL LOW (ref >60)
Potassium: 4.3 mEq/L (ref 3.5–5.1)

## 2010-05-13 LAB — BRAIN NATRIURETIC PEPTIDE: Pro B Natriuretic peptide (BNP): 2358 pg/mL — ABNORMAL HIGH (ref 0.0–100.0)

## 2010-05-14 LAB — BASIC METABOLIC PANEL
CO2: 23 mEq/L (ref 19–32)
Calcium: 9.1 mg/dL (ref 8.4–10.5)
GFR calc Af Amer: 40 mL/min — ABNORMAL LOW (ref >60)
GFR calc non Af Amer: 33 mL/min — ABNORMAL LOW (ref >60)
Sodium: 143 mEq/L (ref 135–145)

## 2010-05-14 LAB — EXPECTORATED SPUTUM ASSESSMENT W GRAM STAIN, RFLX TO RESP C

## 2010-05-15 LAB — URINALYSIS, ROUTINE W REFLEX MICROSCOPIC
Bilirubin Urine: NEGATIVE
Nitrite: POSITIVE — AB
Specific Gravity, Urine: 1.012 (ref 1.005–1.030)
Urobilinogen, UA: 0.2 mg/dL (ref 0.0–1.0)
pH: 5.5 (ref 5.0–8.0)

## 2010-05-15 LAB — BASIC METABOLIC PANEL
BUN: 18 mg/dL (ref 6–23)
CO2: 22 mEq/L (ref 19–32)
Chloride: 107 mEq/L (ref 96–112)
Creatinine, Ser: 1.31 mg/dL — ABNORMAL HIGH (ref 0.4–1.2)

## 2010-05-15 LAB — URINE MICROSCOPIC-ADD ON

## 2010-05-16 LAB — BASIC METABOLIC PANEL
BUN: 22 mg/dL (ref 6–23)
CO2: 25 mEq/L (ref 19–32)
Calcium: 9.3 mg/dL (ref 8.4–10.5)
Glucose, Bld: 115 mg/dL — ABNORMAL HIGH (ref 70–99)
Sodium: 142 mEq/L (ref 135–145)

## 2010-05-17 LAB — URINE CULTURE
Colony Count: >=100000
Culture  Setup Time: 201201270109

## 2010-05-21 NOTE — Discharge Summary (Signed)
[name], [name]               ACCOUNT NO.:  [id number]  MEDICAL RECORD NO.:  [id number]          PATIENT TYPE:  INP  LOCATION:  2501                         FACILITY:  [facility]  PHYSICIAN:  [name], M.D.     DATE OF BIRTH:  01/09/1922  DATE OF ADMISSION:  05/07/2010 DATE OF DISCHARGE:  05/15/2010                        DISCHARGE SUMMARY - REFERRING   DISCHARGE DIAGNOSES: 1. Non-ST elevation myocardial infarction and ongoing unstable angina,     acute coronary syndrome with mild elevation in troponin to 0.60. 2. Severe coronary artery disease with Taxus stent to LAD in 2005 and     known 95% stenosis to the RCA, at that time turned down for bypass     grafting to CCS secondary to age and frail status.  She underwent     last nuclear stress test in May 2011 with mild-to-moderate     ischemia, treated medically. 3. History of decreased EF with most recent echo on Sep 03, 2009; EF     greater than 55%. 4. Mild-to-moderate pulmonary hypertension and moderate tricuspid     regurgitation. 5. Atrial flutter, resolved with rapid ventricular response. 6. Sick sinus syndrome. 7. Bradycardia as well as junctional rhythm. 8. Nonsustained ventricular tachycardia. 9. Chronic kidney disease stage III. 10.Mild congestive heart failure, resolved. 11.Severe gastroesophageal reflux disease. 12.History of esophageal stricture versus web back in 2009 and in     2011, tiny Zenker diverticulum in the esophagus. 13.Chronic thrombocytopenia. 14.Borderline diabetic. 15.Mild dementia.  DISCHARGE CONDITION:  Improved.  PROCEDURES:  None.  DISCHARGE INSTRUCTIONS: 1. Increase activity slowly.  She uses wheelchair at home. 2. Low sodium, heart healthy, watch sweets, borderline diabetic. 3. Follow up with Dr. Lynnea Ferrier on May 29, 2010 at 11:15 a.m. 4. Please sit straight up in bed or chair after meals to prevent     reflux for at least one hour and please keep head of bed elevated. 5. Give  large pills in puree, swallow with liquids, also swallow x2,     which patient has been doing independently.  Also, swallow all     solids with liquids. 6. The patient will be discharged to skilled nursing facility.  DISCHARGE MEDICATIONS:  See medication reconciliation sheet.  During hospitalization, the patient's code status was limited.  No CPR. No intubation.  No defibrillation or cardioversion other than any planned cardioversions for AFib with flutter.  The patient's angina is upper back pain.  HOSPITAL COURSE:  The patient is an 77 year old, patient of Dr. Lynnea Ferrier went to [facility] for chest pain on May 06, 2010 by EMS. She was having shoulder pain bilaterally, which is her angina.  She was given 2 sublingual nitro without relief.  She was then brought to the emergency room and they called [facility] per [facility], and we had her transported to [facility].  She had associated symptoms of shortness of breath and nausea.  She also was somewhat lightheaded.  She has known coronary artery disease and actually had been turned down for bypass grafting in 2005, so a stent was placed to her LAD, but her 95% RCA stenosis could never had  intervention.  Previously, she has had EF as low as 40, but her most recent 2-D echo in May 2011 was 55%.  When she arrived to [facility], she was pain-free.  Her peak troponin went up to 0.60. Her cardiac CK-MB was negative.  She has been living with her family at home.  She was admitted.  Her dementia increased here in the hospital secondary to being away from home.  Dr. Gery Pray talked to the daughter and to the healthcare power of attorney, and they discussed palliative care. Palliative Care talked to the family.  Rest of the family seemed quite upset and felt their mother was doing quite well.  We explained the severity of the coronary disease.  The patient continued with arrhythmias.  She had some sustained V-tach, and then on the evening  of the May 08, 2010, she had what appeared to be 2:1 flutter versus SVT.  She was given IV Lopressor and she did have junctional rhythm after that.  She was placed on limited code status.  She also has failure to thrive.  She has continued with some arrhythmias.  Dr. Lynnea Ferrier, her primary cardiologist, saw the patient and they discussed issues and the family does not wish her to have a pacemaker.  We also had Speech Pathology see the patient.  She did not have aspiration, but she is continuing to regurgitate liquid.  We sent it for sputum culture.  It was not the sputum, we felt this is GI.  Her chest x-ray did not show any pneumonia. She has an old finding of possible barium and this has been present for some time.  She has not had fever.  She had elevated white count.  Please note the patient's diet should be regular and to continue with reflux precautions.  Prior to admission chest x-ray; enlargement of cardiac silhouette, old aspirated barium in right lung base.  No acute abnormalities.  Follow up on May 07, 2010; cardiomegaly, calcified, slightly tortuous aorta, remote aspiration changes in the right lung base with no changes. Follow up on May 11, 2010; cardiomegaly with vascular congestion, worsening basilar atelectasis, prior right lower lobe barium aspiration. Swallowing study on May 12, 2010 is as stated.  Hemoglobin at discharge 12.1, hematocrit 37.1, WBC 6.3, and platelets 83,000 and they have been as low as 79,000.  Chemistry, basic metabolic pending, but previously sodium 143, potassium 4.3, chloride 113, CO2 of 23, glucose 94, BUN 17, creatinine 1.48, calcium 9.1.  BNP was elevated at 2358 and prior to discharge was 629.  Sputum assessment was sent, but it was not sputum.  PHYSICAL EXAMINATION:  VITAL SIGNS:  On discharge, blood pressure was 108/40, pulse 58, temp was 98, respiratory rate is 20, oxygen saturation on room air was 99%.  She was seen by  Dr. Royann Shivers. LUNGS:  Clear. NECK:  No JVD. HEART:  Regular rate and rhythm without S2 and presternal impulse.  No murmur. ABDOMEN:  Soft.  Positive bowel sounds. EXTREMITIES:  She has had no edema.  She was stable.  Over the last 24 hours, she has had only one episode of angina requiring sublingual nitro.  We have therefore increased her Imdur to 90 mg and she is to wear a nitro patch.  She will have a few hours for the Imdur worn out before she gets the patch at night.  We will put her on Protonix to help with her reflux and I have restarted her antibiotic that she takes to prevent urinary tract  infection.  She has maintained on sinus rhythm to slight sinus brady with low-dose beta-blocker, but has not had any more tachyarrhythmias on the beta- blocker.     Darcella Gasman. Annie Paras, N.P.   ______________________________ [name], M.D.    LRI/MEDQ  D:  05/15/2010  T:  05/15/2010  Job:  [id number]  cc:   Ritta Slot, MD Madelin Rear. Sherwood Gambler, MD  Electronically Signed by Nada Boozer N.P. on 05/16/2010 07:48:30 PM Electronically Signed by [name] M.D. on 05/21/2010 02:39:48 PM

## 2010-05-22 NOTE — Letter (Signed)
Summary: [facility]  [facility]   Imported By: Faythe Ghee 07/26/2009 09:49:09  _____________________________________________________________________  External Attachment:    Type:   Image     Comment:   External Document

## 2010-05-22 NOTE — Consult Note (Signed)
Summary: H[facility]ology for [facility]   Imported By: Roderic Ovens 02/12/2010 15:58:51  _____________________________________________________________________  External Attachment:    Type:   Image     Comment:   External Document

## 2010-05-22 NOTE — Progress Notes (Signed)
Summary: PCP to handle refills   Phone Note Outgoing Call   Call placed by: Larita Fife Via LPN,  March 05, 2010 3:41 PM Action Taken: Phone Call Completed Details for Reason: NTG Summary of Call: Pt. states she will have her PCP refill her medications from this time forward as she does not plan to be seen in this clinicany longer.  Initial call taken by: Larita Fife Via LPN,  March 05, 2010 3:43 PM

## 2010-06-02 NOTE — Consult Note (Signed)
[name], [name]               ACCOUNT NO.:  [id number]  MEDICAL RECORD NO.:  [id number]          PATIENT TYPE:  INP  LOCATION:  2501                         FACILITY:  [facility]  PHYSICIAN:  Mauro Kaufmann, MD         DATE OF BIRTH:  11-08-1921  DATE OF CONSULTATION:  05/09/2010 DATE OF DISCHARGE:                                CONSULTATION   Consult is for review of medical treatment options and clarification of goals of care and end-of-life wishes.  Consult is requested by Nanetta Batty, MD  Collaborating physician to appear on consult is Mauro Kaufmann, MD  This nurse practitioner, Lorinda Creed reviewed medical records, received report from team, assessed the patient, and then met at the patient's bedside with her large family to include her daughters; Gae Bon, Haydee Salter and Onalee Hua and Mertha Baars,  all her children.  Nancy Nordmann is the verbally designated Management consultant and spokesperson for the family.  We met today to discuss diagnosis, prognosis, goals of care, end-of-life wishes, disposition, and options.  PROBLEM LIST: 1. Ischemic cardiomyopathy. 2. Coronary artery disease. 3. Sick sinus syndrome. 4. Non-ST-elevation myocardial infarction. 5. Hypertension. 6. Idiopathic thrombocytopenia. 7. Failure to thrive.     a.     Weight loss with poor p.o. intake, increased weakness and      fatigue, increased infections with increased rehospitalization. 8. Palliative performance scale 30% at best.  FAMILY/PATIENT GOALS/RECOMMENDATIONS: 1. Limited code. 2. Continue with available medical interventions to treat underlying     illness.  Family is hopeful for improvement.  A detailed discussion was had today regarding advance directives, concepts specific to code status, artificial feeding, and hydration, continued use of IV antibiotics, and rehospitalization was had.  The difference between aggressive medical intervention path and a  palliative comfort care path for this patient at this time in this situation was detailed.  The natural trajectory and expectations at end-of-life were discussed.  Questions and concerns were addressed.  A hard choices booklet was left for review and the family is encouraged to call with questions or concerns.  At the family's request, cardiologist was called and involved in the last segment of this goals of care meeting.  Dr. Herbie Baltimore sat down with the family and further discussed the patient's present medical situation and available options as they relate to goals of care and end-of-life wishes.  Today's discussion was emotionally charged.  The family on multiple occasions alluded to the fact that the hospital and physicians are "giving up" because of her age.  The conversation was had in multiple formats in an attempt to help the patient's family understand the patient's overall poor prognosis as it relates to her heart disease and an overall failure to thrive situation. The patient herself was unable to participate in the conversation in that she is lethargic and slept through most of the conversation.  Total time spent on the unit was 120 minutes.  Time in 10:30, time out 12:30.  Greater than 50% of the time was spent in counseling and coordination of care.  Diagnosis and prognosis was attempted to be  clarified.  The concept of hospice and palliative care was reviewed. The team approach to our service was offered.  Values and goals of care important to the patient and family were attempted to be elicited. Palliative care will continue to support holistically, and the family is encouraged to call with questions or concerns.  Any questions or concerns regarding this consult, please call Herbert Pun, NP at [phone].     Herbert Pun, NP   ______________________________ Mauro Kaufmann, MD    MCL/MEDQ  D:  05/09/2010  T:  05/10/2010  Job:  [id number]  cc:   Nanetta Batty,  M.D.  Electronically Signed by Lorinda Creed NP on 05/26/2010 12:44:18 PM Electronically Signed by Mauro Kaufmann  on 06/02/2010 11:04:40 AM

## 2010-06-30 LAB — COMPREHENSIVE METABOLIC PANEL
ALT: 13 U/L (ref 0–35)
AST: 21 U/L (ref 0–37)
Calcium: 9.6 mg/dL (ref 8.4–10.5)
GFR calc Af Amer: >60 mL/min (ref >60)
Sodium: 138 mEq/L (ref 135–145)
Total Protein: 6.5 g/dL (ref 6.0–8.3)

## 2010-06-30 LAB — URINALYSIS, ROUTINE W REFLEX MICROSCOPIC
Bilirubin Urine: NEGATIVE
Glucose, UA: NEGATIVE mg/dL
Nitrite: NEGATIVE
Specific Gravity, Urine: 1.01 (ref 1.005–1.030)
pH: 5.5 (ref 5.0–8.0)

## 2010-06-30 LAB — CBC
HCT: 33.3 % — ABNORMAL LOW (ref 36.0–46.0)
Hemoglobin: 10.7 g/dL — ABNORMAL LOW (ref 12.0–15.0)
Hemoglobin: 11.5 g/dL — ABNORMAL LOW (ref 12.0–15.0)
MCHC: 33.5 g/dL (ref 30.0–36.0)
Platelets: 47 10*3/uL — ABNORMAL LOW (ref 150–400)
RBC: 3.93 MIL/uL (ref 3.87–5.11)
RBC: 3.99 MIL/uL (ref 3.87–5.11)
RDW: 14.4 % (ref 11.5–15.5)
RDW: 14.5 % (ref 11.5–15.5)
RDW: 14.3 % (ref 11.5–15.5)
WBC: 2.9 10*3/uL — ABNORMAL LOW (ref 4.0–10.5)
WBC: 3.5 10*3/uL — ABNORMAL LOW (ref 4.0–10.5)

## 2010-06-30 LAB — DIFFERENTIAL
Basophils Absolute: 0 10*3/uL (ref 0.0–0.1)
Basophils Absolute: 0 10*3/uL (ref 0.0–0.1)
Eosinophils Absolute: 0 10*3/uL (ref 0.0–0.7)
Eosinophils Relative: 0 % (ref 0–5)
Lymphocytes Relative: 30 % (ref 12–46)
Lymphocytes Relative: 43 % (ref 12–46)
Lymphs Abs: 0.9 10*3/uL (ref 0.7–4.0)
Lymphs Abs: 1.5 10*3/uL (ref 0.7–4.0)
Lymphs Abs: 0.9 10*3/uL (ref 0.7–4.0)
Monocytes Absolute: 0.4 10*3/uL (ref 0.1–1.0)
Monocytes Relative: 10 % (ref 3–12)
Monocytes Relative: 6 % (ref 3–12)
Neutro Abs: 1.5 10*3/uL — ABNORMAL LOW (ref 1.7–7.7)
Neutro Abs: 1.6 10*3/uL — ABNORMAL LOW (ref 1.7–7.7)
Neutrophils Relative %: 55 % (ref 43–77)

## 2010-06-30 LAB — URINE CULTURE
Colony Count: NO GROWTH
Culture: NO GROWTH

## 2010-06-30 LAB — URINE MICROSCOPIC-ADD ON

## 2010-06-30 LAB — BASIC METABOLIC PANEL
BUN: 15 mg/dL (ref 6–23)
Creatinine, Ser: 0.96 mg/dL (ref 0.4–1.2)
GFR calc Af Amer: >60 mL/min (ref >60)
GFR calc non Af Amer: 55 mL/min — ABNORMAL LOW (ref >60)

## 2010-07-03 LAB — BRAIN NATRIURETIC PEPTIDE: Pro B Natriuretic peptide (BNP): 1090 pg/mL — ABNORMAL HIGH (ref 0.0–100.0)

## 2010-07-03 LAB — BASIC METABOLIC PANEL
BUN: 30 mg/dL — ABNORMAL HIGH (ref 6–23)
BUN: 9 mg/dL (ref 6–23)
CO2: 23 mEq/L (ref 19–32)
CO2: 24 mEq/L (ref 19–32)
Calcium: 8.9 mg/dL (ref 8.4–10.5)
Calcium: 9.4 mg/dL (ref 8.4–10.5)
Creatinine, Ser: 1.02 mg/dL (ref 0.4–1.2)
GFR calc Af Amer: >60 mL/min (ref >60)
GFR calc non Af Amer: 45 mL/min — ABNORMAL LOW (ref >60)
GFR calc non Af Amer: >60 mL/min (ref >60)
Glucose, Bld: 91 mg/dL (ref 70–99)
Glucose, Bld: 95 mg/dL (ref 70–99)
Potassium: 3.5 mEq/L (ref 3.5–5.1)
Potassium: 3.9 mEq/L (ref 3.5–5.1)
Potassium: 4.3 mEq/L (ref 3.5–5.1)
Sodium: 144 mEq/L (ref 135–145)

## 2010-07-03 LAB — CBC
HCT: 29.5 % — ABNORMAL LOW (ref 36.0–46.0)
HCT: 29.5 % — ABNORMAL LOW (ref 36.0–46.0)
HCT: 30.5 % — ABNORMAL LOW (ref 36.0–46.0)
Hemoglobin: 10 g/dL — ABNORMAL LOW (ref 12.0–15.0)
Hemoglobin: 10.2 g/dL — ABNORMAL LOW (ref 12.0–15.0)
Hemoglobin: 10.3 g/dL — ABNORMAL LOW (ref 12.0–15.0)
Hemoglobin: 10.6 g/dL — ABNORMAL LOW (ref 12.0–15.0)
Hemoglobin: 11.8 g/dL — ABNORMAL LOW (ref 12.0–15.0)
MCH: 29 pg (ref 26.0–34.0)
MCH: 29 pg (ref 26.0–34.0)
MCH: 29.3 pg (ref 26.0–34.0)
MCH: 29.6 pg (ref 26.0–34.0)
MCHC: 33.5 g/dL (ref 30.0–36.0)
MCHC: 33.8 g/dL (ref 30.0–36.0)
MCHC: 33.8 g/dL (ref 30.0–36.0)
MCHC: 34.5 g/dL (ref 30.0–36.0)
MCV: 86.3 fL (ref 78.0–100.0)
Platelets: 55 10*3/uL — ABNORMAL LOW (ref 150–400)
RBC: 3.56 MIL/uL — ABNORMAL LOW (ref 3.87–5.11)
RBC: 4.05 MIL/uL (ref 3.87–5.11)
RDW: 14.8 % (ref 11.5–15.5)
RDW: 14.9 % (ref 11.5–15.5)

## 2010-07-03 LAB — PROTIME-INR: INR: 1.01 (ref 0.00–1.49)

## 2010-07-03 LAB — DIFFERENTIAL
Basophils Absolute: 0 10*3/uL (ref 0.0–0.1)
Basophils Absolute: 0 10*3/uL (ref 0.0–0.1)
Basophils Relative: 0 % (ref 0–1)
Basophils Relative: 0 % (ref 0–1)
Basophils Relative: 0 % (ref 0–1)
Basophils Relative: 0 % (ref 0–1)
Eosinophils Absolute: 0 10*3/uL (ref 0.0–0.7)
Eosinophils Absolute: 0.1 10*3/uL (ref 0.0–0.7)
Eosinophils Absolute: 0.1 10*3/uL (ref 0.0–0.7)
Eosinophils Relative: 0 % (ref 0–5)
Eosinophils Relative: 2 % (ref 0–5)
Lymphs Abs: 0.3 10*3/uL — ABNORMAL LOW (ref 0.7–4.0)
Lymphs Abs: 0.7 10*3/uL (ref 0.7–4.0)
Monocytes Absolute: 0.6 10*3/uL (ref 0.1–1.0)
Monocytes Absolute: 0.7 10*3/uL (ref 0.1–1.0)
Monocytes Absolute: 0.7 10*3/uL (ref 0.1–1.0)
Monocytes Relative: 10 % (ref 3–12)
Monocytes Relative: 12 % (ref 3–12)
Monocytes Relative: 13 % — ABNORMAL HIGH (ref 3–12)
Monocytes Relative: 6 % (ref 3–12)
Neutro Abs: 3.9 10*3/uL (ref 1.7–7.7)
Neutro Abs: 3.9 10*3/uL (ref 1.7–7.7)
Neutro Abs: 3.9 10*3/uL (ref 1.7–7.7)
Neutro Abs: 4.3 10*3/uL (ref 1.7–7.7)
Neutrophils Relative %: 68 % (ref 43–77)

## 2010-07-03 LAB — CARDIAC PANEL(CRET KIN+CKTOT+MB+TROPI)
CK, MB: 3.5 ng/mL (ref 0.3–4.0)
CK, MB: 5.1 ng/mL — ABNORMAL HIGH (ref 0.3–4.0)
Relative Index: 3.2 — ABNORMAL HIGH (ref 0.0–2.5)
Total CK: 104 U/L (ref 7–177)
Total CK: 110 U/L (ref 7–177)
Troponin I: 0.2 ng/mL — ABNORMAL HIGH (ref 0.00–0.06)
Troponin I: 0.32 ng/mL — ABNORMAL HIGH (ref 0.00–0.06)

## 2010-07-03 LAB — URINALYSIS, ROUTINE W REFLEX MICROSCOPIC
Bilirubin Urine: NEGATIVE
Hgb urine dipstick: NEGATIVE
Protein, ur: NEGATIVE mg/dL
Specific Gravity, Urine: 1.01 (ref 1.005–1.030)
Urobilinogen, UA: 0.2 mg/dL (ref 0.0–1.0)

## 2010-07-03 LAB — URINE CULTURE: Colony Count: 40000

## 2010-07-03 LAB — HEMOCCULT GUIAC POC 1CARD (OFFICE): Fecal Occult Bld: NEGATIVE

## 2010-07-03 LAB — STOOL CULTURE

## 2010-07-06 LAB — BASIC METABOLIC PANEL
BUN: 6 mg/dL (ref 6–23)
CO2: 29 mEq/L (ref 19–32)
CO2: 30 mEq/L (ref 19–32)
Calcium: 9.3 mg/dL (ref 8.4–10.5)
Creatinine, Ser: 0.78 mg/dL (ref 0.4–1.2)
GFR calc non Af Amer: >60 mL/min (ref >60)
GFR calc non Af Amer: >60 mL/min (ref >60)
Glucose, Bld: 103 mg/dL — ABNORMAL HIGH (ref 70–99)
Glucose, Bld: 124 mg/dL — ABNORMAL HIGH (ref 70–99)
Potassium: 5 mEq/L (ref 3.5–5.1)
Sodium: 139 mEq/L (ref 135–145)
Sodium: 141 mEq/L (ref 135–145)

## 2010-07-06 LAB — CBC
HCT: 32.1 % — ABNORMAL LOW (ref 36.0–46.0)
HCT: 34.4 % — ABNORMAL LOW (ref 36.0–46.0)
Hemoglobin: 11.5 g/dL — ABNORMAL LOW (ref 12.0–15.0)
MCHC: 33.4 g/dL (ref 30.0–36.0)
MCHC: 33.7 g/dL (ref 30.0–36.0)
Platelets: 49 10*3/uL — ABNORMAL LOW (ref 150–400)
RBC: 3.6 MIL/uL — ABNORMAL LOW (ref 3.87–5.11)
RDW: 14.5 % (ref 11.5–15.5)
RDW: 14.6 % (ref 11.5–15.5)
RDW: 14.7 % (ref 11.5–15.5)

## 2010-07-06 LAB — COMPREHENSIVE METABOLIC PANEL
ALT: 25 U/L (ref 0–35)
AST: 33 U/L (ref 0–37)
CO2: 31 mEq/L (ref 19–32)
Calcium: 9.2 mg/dL (ref 8.4–10.5)
GFR calc Af Amer: >60 mL/min (ref >60)
Sodium: 140 mEq/L (ref 135–145)
Total Protein: 5.3 g/dL — ABNORMAL LOW (ref 6.0–8.3)

## 2010-07-06 LAB — DIFFERENTIAL
Basophils Absolute: 0 10*3/uL (ref 0.0–0.1)
Basophils Absolute: 0 10*3/uL (ref 0.0–0.1)
Eosinophils Absolute: 0.1 10*3/uL (ref 0.0–0.7)
Eosinophils Relative: 1 % (ref 0–5)
Eosinophils Relative: 2 % (ref 0–5)
Eosinophils Relative: 3 % (ref 0–5)
Lymphocytes Relative: 17 % (ref 12–46)
Lymphocytes Relative: 21 % (ref 12–46)
Lymphs Abs: 0.8 10*3/uL (ref 0.7–4.0)
Monocytes Absolute: 0.5 10*3/uL (ref 0.1–1.0)
Monocytes Relative: 11 % (ref 3–12)
Neutrophils Relative %: 66 % (ref 43–77)

## 2010-07-07 LAB — URINALYSIS, ROUTINE W REFLEX MICROSCOPIC
Glucose, UA: NEGATIVE mg/dL
Protein, ur: NEGATIVE mg/dL
pH: 5.5 (ref 5.0–8.0)

## 2010-07-07 LAB — DIFFERENTIAL
Basophils Absolute: 0 10*3/uL (ref 0.0–0.1)
Basophils Relative: 0 % (ref 0–1)
Eosinophils Absolute: 0 10*3/uL (ref 0.0–0.7)
Eosinophils Relative: 0 % (ref 0–5)
Eosinophils Relative: 0 % (ref 0–5)
Eosinophils Relative: 1 % (ref 0–5)
Lymphocytes Relative: 21 % (ref 12–46)
Lymphs Abs: 1 10*3/uL (ref 0.7–4.0)
Monocytes Absolute: 0.5 10*3/uL (ref 0.1–1.0)
Monocytes Absolute: 0.7 10*3/uL (ref 0.1–1.0)
Monocytes Relative: 12 % (ref 3–12)
Monocytes Relative: 12 % (ref 3–12)
Monocytes Relative: 18 % — ABNORMAL HIGH (ref 3–12)
Neutrophils Relative %: 79 % — ABNORMAL HIGH (ref 43–77)

## 2010-07-07 LAB — CULTURE, BLOOD (ROUTINE X 2): Report Status: 6182011

## 2010-07-07 LAB — CARDIAC PANEL(CRET KIN+CKTOT+MB+TROPI)
CK, MB: 1.7 ng/mL (ref 0.3–4.0)
Relative Index: 0.6 (ref 0.0–2.5)
Relative Index: 0.7 (ref 0.0–2.5)
Total CK: 415 U/L — ABNORMAL HIGH (ref 7–177)
Troponin I: 0.08 ng/mL — ABNORMAL HIGH (ref 0.00–0.06)
Troponin I: 0.09 ng/mL — ABNORMAL HIGH (ref 0.00–0.06)

## 2010-07-07 LAB — COMPREHENSIVE METABOLIC PANEL
CO2: 28 mEq/L (ref 19–32)
Calcium: 9.6 mg/dL (ref 8.4–10.5)
Chloride: 104 mEq/L (ref 96–112)
Creatinine, Ser: 1.11 mg/dL (ref 0.4–1.2)
GFR calc non Af Amer: 47 mL/min — ABNORMAL LOW (ref >60)
Glucose, Bld: 111 mg/dL — ABNORMAL HIGH (ref 70–99)
Total Bilirubin: 0.3 mg/dL (ref 0.3–1.2)

## 2010-07-07 LAB — BASIC METABOLIC PANEL
CO2: 29 mEq/L (ref 19–32)
Calcium: 9 mg/dL (ref 8.4–10.5)
Chloride: 105 mEq/L (ref 96–112)
GFR calc Af Amer: >60 mL/min (ref >60)
GFR calc Af Amer: >60 mL/min (ref >60)
GFR calc non Af Amer: 53 mL/min — ABNORMAL LOW (ref >60)
Glucose, Bld: 144 mg/dL — ABNORMAL HIGH (ref 70–99)
Potassium: 3.1 mEq/L — ABNORMAL LOW (ref 3.5–5.1)
Sodium: 139 mEq/L (ref 135–145)
Sodium: 140 mEq/L (ref 135–145)

## 2010-07-07 LAB — WOUND CULTURE: Culture: NO GROWTH

## 2010-07-07 LAB — CBC
HCT: 34.1 % — ABNORMAL LOW (ref 36.0–46.0)
HCT: 35.6 % — ABNORMAL LOW (ref 36.0–46.0)
Hemoglobin: 11.9 g/dL — ABNORMAL LOW (ref 12.0–15.0)
Hemoglobin: 12.1 g/dL (ref 12.0–15.0)
MCHC: 33.8 g/dL (ref 30.0–36.0)
MCHC: 33.8 g/dL (ref 30.0–36.0)
MCV: 87 fL (ref 78.0–100.0)
MCV: 87 fL (ref 78.0–100.0)
Platelets: 44 10*3/uL — ABNORMAL LOW (ref 150–400)
RBC: 3.93 MIL/uL (ref 3.87–5.11)
RBC: 4.05 MIL/uL (ref 3.87–5.11)
RBC: 4.1 MIL/uL (ref 3.87–5.11)
WBC: 4.7 10*3/uL (ref 4.0–10.5)

## 2010-07-07 LAB — URINE MICROSCOPIC-ADD ON

## 2010-07-07 LAB — POCT CARDIAC MARKERS: Troponin i, poc: <0.05 ng/mL (ref 0.00–0.09)

## 2010-07-07 LAB — URINE CULTURE: Culture: NO GROWTH

## 2010-07-07 LAB — PROTIME-INR: Prothrombin Time: 14.4 seconds (ref 11.6–15.2)

## 2010-07-07 LAB — APTT: aPTT: 36 seconds (ref 24–37)

## 2010-07-07 LAB — TSH: TSH: 0.883 u[IU]/mL (ref 0.350–4.500)

## 2010-07-13 LAB — CBC
HCT: 35.7 % — ABNORMAL LOW (ref 36.0–46.0)
MCV: 85.6 fL (ref 78.0–100.0)
Platelets: 50 10*3/uL — ABNORMAL LOW (ref 150–400)
RDW: 14.8 % (ref 11.5–15.5)
WBC: 3.9 10*3/uL — ABNORMAL LOW (ref 4.0–10.5)

## 2010-07-14 ENCOUNTER — Ambulatory Visit (HOSPITAL_COMMUNITY): Payer: Medicare Other | Admitting: Oncology

## 2010-07-14 DIAGNOSIS — D693 Immune thrombocytopenic purpura: Secondary | ICD-10-CM

## 2010-07-31 LAB — DIFFERENTIAL
Basophils Relative: 0 % (ref 0–1)
Eosinophils Absolute: 0 10*3/uL (ref 0.0–0.7)
Monocytes Absolute: 0.3 10*3/uL (ref 0.1–1.0)
Neutrophils Relative %: 71 % (ref 43–77)

## 2010-07-31 LAB — CBC
HCT: 36.7 % (ref 36.0–46.0)
Platelets: 44 10*3/uL — CL (ref 150–400)
RDW: 13.7 % (ref 11.5–15.5)

## 2010-09-02 NOTE — Letter (Signed)
January 12, 2007    Patrica Duel, M.D.  [address], [address]  [place], [place] [address]   RE:  PAIZLEY, RAMELLA  MRN:  [id number]  /  DOB:  1921-07-05   Dear Loraine Leriche:   Ms. Sunderlin returns to the office following a recent admission to [facility] with a small non-Q myocardial infarction. Echocardiography  was unchanged revealing an ejection fraction of approximately 40% with  an apical wall motion abnormality. She did fine in hospital. She had  some exacerbation of ITP with platelets in the 50000 range. There was no  heart failure. She is chronically debilitated and was referred for  outpatient physical therapy.   CURRENT MEDICATIONS:  1. Aspirin 81 mg daily.  2. Furosemide 20 mg daily.  3. Simvastatin 40 mg daily.  4. Lisinopril 10 mg daily.  5. KCL 20 mEq daily.  6. Calcium and vitamin D.  7. Zinc.  8. Coenzyme Q.   Her very low dose of Metoprolol was stopped due to bradycardia.   PHYSICAL EXAMINATION:  GENERAL:  Pleasant elderly woman in no acute  distress.  VITAL SIGNS:  Weight 110, 1 pound less than in January. Blood pressure  100/50, heart rate 60 and regular, respirations 16.  NECK:  No jugular venous distension.  LUNGS:  Clear.  CARDIAC:  Normal first and second heart sounds; fourth heart sound a  modest systolic ejection murmur.  ABDOMEN:  Soft and nontender; no organomegaly.  EXTREMITIES:  One half+ ankle and pre-tibial edema.   IMPRESSION:  Ms. Liddicoat is doing well at the present time. She is  cautioned to call if she has any recurrent back or chest discomfort,  dyspnea, or loss of consciousness. She is scheduled to see Dr. Mariel Sleet  in one month. A chemistry profile and CBC will be obtained in three  weeks. I will reassess this nice woman in six weeks.    Sincerely,      Gerrit Friends. Dietrich Pates, MD, [facility]  Electronically Signed    RMR/MedQ  DD: 01/12/2007  DT: 01/13/2007  Job #: [id number]

## 2010-09-02 NOTE — Letter (Signed)
July 15, 2007    Patrica Duel, M.D.  [address]  [place], [place] [address]   RE:  ZOELLE, MARKUS  MRN:  [id number]  /  DOB:  Dec 06, 1921   Dear Loraine Leriche:   Ms. Moffat returns to the office for continued assessment and  treatment of ischemic cardiomyopathy and multiple other health problems.  Despite her age and chronic medical conditions, she has done fairly  well.  She has a sedentary lifestyle, without much in the way of recent  symptoms.  She does report right leg and knee pain for the past 3 weeks.  She received a course of antibiotics for a suspected skin-related  infection.  She continues to have some discomfort.  She had one episode  of shoulder and back discomfort, which has been her anginal equivalent.  This was promptly relieved with nitroglycerin.  She has had stable ITP  with no bleeding episodes.  She has sick sinus syndrome, but has not had  recognized atrial fibrillation nor symptomatic bradycardia, of late.   CURRENT MEDICATIONS:  Include:  1. Aspirin 81 mg daily.  2. Furosemide 20 mg daily.  3. Simvastatin 40 mg daily.  4. Lisinopril 10 mg daily.  5. KCl 20 mEq daily.  6. Calcium and vitamin D.  7. Zinc.  8. Multivitamin.  9. Coenzyme Q.   EXAMINATION:  GENERAL:  On exam, elderly woman seated in a wheelchair,  in no acute distress.  VITAL SIGNS:  The weight is 118, 6 pounds more than 4 months ago.  Blood  pressure 100/50, heart rate 55 and somewhat irregular, respirations 16.  NECK:  No jugular venous distention; no carotid bruits.  LUNGS:  Few bibasilar rales.  CARDIAC:  Normal first and second heart sounds; modest basilar systolic  ejection murmur.  ABDOMEN:  Soft and nontender; no masses; no organomegaly.  EXTREMITIES:  1/2+ pitting pretibial edema bilaterally; tense a right  calf with moderate tenderness, including in the popliteal fossa.  Knee  mobility is okay.  There is no joint effusion.   A stat venous Doppler of the right leg was  obtained.  This study was  negative.   EKG shows sinus bradycardia, left atrial abnormality, right bundle  branch block, left axis deviation and ST-T-wave abnormalities,  consistent with lateral ischemia or LVH.   Most recent chemistry profile and CBC was obtained in January.  The CBC  was normal, except for platelet count of 73,000.  Her metabolic profile  showed a potassium of 6.0 with a slightly elevated BUN of 25, but a  normal creatinine of 0.9.   IMPRESSION:  Ms. Hornbaker is doing well overall.  Her leg edema may be  due to having her feet dependent most of the day, without much muscle  contraction.  Leg elevation was recommended.  She is hyperkalemic.  Potassium will be discontinued and a metabolic profile repeated in 2  weeks.  I will plan to see this nice woman again, in 6 months.  She is  cautioned to call the office or come to the emergency department, if she  develops persistent, frequent or more severe back and shoulder pain.    Sincerely,      Gerrit Friends. Dietrich Pates, MD, [facility]  Electronically Signed    RMR/MedQ  DD: 07/15/2007  DT: 07/16/2007  Job #: [id number]

## 2010-09-02 NOTE — H&P (Signed)
[name], [name]               ACCOUNT NO.:  [id number]   MEDICAL RECORD NO.:  [id number]          PATIENT TYPE:  INP   LOCATION:  2906                         FACILITY:  [facility]   PHYSICIAN:  Adela Ports, MD   DATE OF BIRTH:  Nov 23, 1921   DATE OF ADMISSION:  01/01/2007  DATE OF DISCHARGE:                              HISTORY & PHYSICAL   CHIEF COMPLAINT:  Non-ST elevation MI.   HISTORY OF PRESENT ILLNESS:  [name] is an 77 year old woman with  a history of coronary artery disease with a number of prior non-ST  elevation MIs and a history of PCI with drug-eluting stent to the LAD a  number of years ago.  Most recently she has had non-ST elevation MIs  that have been medically managed after discussion with her cardiologist,  her family and herself.  She also has a history of frequent right back  and shoulder pain.  On the afternoon prior to admission, she had a more  significant episode of right shoulder and upper back pain that then  spread to substernal chest and right elbow pain.  Given her discomfort,  she presented to [facility] for further evaluation.  At [facility] she was found to have positive cardiac enzymes and was transferred  to [facility] for further management of a non-ST elevation MI.   Her history is somewhat difficult to obtain, but she reports that she  has been feeling generally well, although she says that her appetite and  p.o. intake have not been very good over the last period of time.  She  also reports decreased urine output and intermittent cough that is  nonproductive.  She denies fevers, orthopnea, PND, syncope, palpitations  or other symptoms.  She does have minimal edema from time to time in her  ankles and gets soreness in her ankles.   PAST MEDICAL HISTORY:  1. Coronary artery disease with past non-ST elevation MIs and PCIs.      Most recently her non-ST elevation MI has been medically managed.  2. Reported history of  congestive heart failure in the past with      ischemic cardiomyopathy with an ejection fraction of 45% in the      past, although on most recent echocardiogram her EF was read as      normal.  3. History of sick sinus syndrome with bradycardia.  4. Osteoarthritis.  5. Hypertension.  6. Dyslipidemia.  7. Shingles.  8. UTI.  9. Left ptosis.  10.History of pneumonia.  11.History of thrombocytopenia attributed to clumping.  12.On home O2 for questionable indications.   SOCIAL HISTORY:  The patient is widowed and lives with her daughter.  She denies tobacco or alcohol use.   FAMILY HISTORY:  Noncontributory.   REVIEW OF SYMPTOMS:  As above in the HPI.  Otherwise 12-system review of  systems is negative in detail.   ALLERGIES:  NO KNOWN DRUG ALLERGIES.   MEDICATIONS:  Aspirin 81 mg p.o. daily, metoprolol 12.5 mg p.o. b.i.d.,  furosemide 10 mg p.o. daily, simvastatin 40 mg p.o. daily, lisinopril 10  mg p.o. daily, potassium 10 mEq p.o. daily, Mucinex 900 mg extended  release p.o. b.i.d., lidocaine patch as needed for shingles pain,  Darvocet q.8 h. p.r.n. pain, Colace 100 mg p.o. q.p.m., multivitamin 1  tablet p.o. daily, zinc 50 mg p.o. daily, Os-Cal plus D 2 tablets daily.   PHYSICAL EXAMINATION:  VITAL SIGNS:  Pulse 57, respiratory rate 16,  blood pressure 129/53, temperature afebrile, weight 110 pounds, oxygen  saturation 100% on 2 L.  GENERAL:  She is an elderly, pleasant woman who is oriented to specific  questions, but somewhat poor historian.  She is thin and appears  chronically ill.  HEENT:  Extraocular movements are intact.  Mucous membranes are dry.  She has some hirsutism.  NECK:  Is supple without thyromegaly.  JVD is 6 cm.  CARDIOVASCULAR:  Regular rate and rhythm.  Normal S1 and S2.  There is a  2/6 systolic murmur.  Distal pulses are 2+ and equal bilaterally.  Blood  pressure is equal in both arms.  LUNGS:  Few scattered crackles at the bases that clear with  coughing.  ABDOMEN:  Soft, nontender and nondistended without rebound or guarding.  EXTREMITIES:  There is trace edema bilaterally, some chronic skin  changes around the ankles.  MUSCULOSKELETAL:  Patient reports right shoulder pain and low back pain,  but there is no specific tenderness to palpation along the spine,  shoulders, scapula or hips bilaterally.  NEUROLOGIC:  Movements are somewhat slow and there is some decrease in  coordination.  Her attention is somewhat limited, but when she does  attend, she is able to answer specific questions.  She moves all  extremities and sensation is intact throughout.   LABORATORY STUDIES:  Chest x-ray showed cardiomegaly with evidence of  prior barium aspiration, but no acute process.  EKG showed rate of 63,  sinus rhythm with an old right bundle branch block.  There are previous  T-wave inversions in lead I and aVL and the septal leads, but there are  new ischemic looking biphasic T-waves in V4 through V6.   LABORATORY DATA:  White count 4.9, hematocrit 33, platelets 50, sodium  140, potassium 4.2, chloride 108, bicarbonate 26, BUN 18, creatinine  0.97, glucose 150.  CK-MB has gone from 6.9 to 24.  Troponin has gone  from 0.32 to 1.96.  PT is 26, INR is 0.6.  HDL 43, LDL 41, BNP is 879  (prior levels over the past year to year and a half have been 614 and  504).  Urinalysis showed a pH of 5.5, a specific gravity greater than  1.030, positive ketones, no leukocyte esterase and no nitrites.   ASSESSMENT AND PLAN:  [name] is an 77 year old woman with a  history of coronary artery disease and non-ST elevation myocardial  infarctions that have been medically managed in the past who returns now  again with atypical pain syndrome and positive enzymes for recurrent non-  ST elevation myocardial infarction.  1. Non-ST elevation myocardial infarction:  We will continue medical      management with Lovenox that was started at the other hospital  and      we will load Plavix with the plan to continue Plavix for 1 year      from this myocardial infarction.  We will titrate beta blocker and      ACE inhibitor.  We will continue current statin dose given her very      good lipid level seen, and we  will continue her on indefinite      aspirin.  2. History of congestive heart failure:  At this time despite an      elevated BNP, she appears euvolemic to dry to me.  She has positive      ketones in her urine and a concentrated urine, though her      creatinine is stable.  We will give her a small amount of gentle      fluid at this time in the setting of her poor p.o. intake.  We will      check an echocardiogram.  3. Blood pressure is equal in both arms and distal pulses are strong.      Despite her atypical pain presentation, I think the likelihood of      dissection is fairly low at this point, and given the patient's      reluctance for invasive interventions, I think we will not do      specific imaging at this time to rule out dissection, but we will      follow conservatively.  4. Continue other medications for patient's other issues.  5. She has a history of bradycardia, and we will monitor her while      treating with beta blockers.  At this point she is slightly      bradycardic in the 50s, but is tolerating it well symptomatically      and by blood pressure.  6. The patient is full code and this has been verified with her and      her daughter.      Adela Ports, MD  Electronically Signed     DWM/MEDQ  D:  01/01/2007  T:  01/01/2007  Job:  [id number]

## 2010-09-02 NOTE — Discharge Summary (Signed)
Dawn, [name]               ACCOUNT NO.:  [id number]   MEDICAL RECORD NO.:  [id number]          PATIENT TYPE:  INP   LOCATION:  3739                         FACILITY:  [facility]   PHYSICIAN:  Gerrit Friends. Dawn Pates, MD, FACCDATE OF BIRTH:  January 02, 1922   DATE OF ADMISSION:  01/01/2007  DATE OF DISCHARGE:  01/08/2007                               DISCHARGE SUMMARY   CARDIOLOGIST:  Dr. Mancos Bing.   HEMATOLOGIST:  Dr. Mariel [name].Marland Kitchen   PRIMARY CARE PHYSICIAN:  Dr. Patrica [name]   REASON FOR ADMISSION:  Non-ST-elevation myocardial infarction.   DISCHARGE DIAGNOSES:  1. Coronary artery disease.      a.     Status post non-ST-elevation myocardial infarction - medical       therapy      b.     Status post multiple myocardial infarctions and percutaneous       interventions in the past - most recent with stenting of the LAD       in 2005 with a Taxus stent      c.     Medical therapy only  2. Ischemic cardiomyopathy with an EF of 40-45%      a.     Echocardiogram this admission revealing distal third of the       LV to be akinetic with significant aneurysmal dilation of the apex  3. Chronic systolic congestive heart failure.      a.     Acute on chronic episode this admission.  4. Sick sinus syndrome with bradycardia      a.     Beta-blocker held.  5. Thrombocytopenia.      a.     Felt to be secondary to idiopathic thrombocytopenic purpura       in the past      b.     HIP panel negative this admission  6. Deconditioning.      a.     Plans for home health nursing and physical therapy.  7. Hypertension.  8. Hyperlipidemia.  9. Osteoarthritis  10.Right bundle branch block   PROCEDURE:  None.   HISTORY:  [name] is an 77 year old female patient with a history  of coronary disease status post a number of prior non-ST-elevation  myocardial infarction and percutaneous interventions in the past.  Recent non-ST-elevation myocardial functions have been treated  medically.  The  patient apparently developed some right arm pain and  back pain.  She presented to [facility] and her cardiac enzymes  returned positive for non-ST-elevation myocardial infarction.  She was  transferred to [facility] for further evaluation and treatment.   HOSPITAL COURSE:  The patient was initially placed on Lovenox and  Plavix.  However, her platelet count was noted to be in the 50,000  range.  Upon review of her history it has been noted that she has had  thrombocytopenia in the past.  Dr. Dietrich [name], her primary cardiologist  did note that she was followed by Dr. Mariel [name].  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  Her aspirin was down titrated to 81 mg a day.  Her  platelet count was followed throughout admission and that remained  fairly stable.   Her cardiac enzymes peaked with a CK-MB of 18.7, troponin I of 0.98.  Her initial BNP was elevated at 879 but it was felt that she was  somewhat volume depleted and she was given gentle IV hydration.  She did  develop some difficulty with volume overload and she was diuresed with  IV Lasix.  Her Lasix was eventually changed to 20 mg daily as well as  potassium 20 mEq daily.  She had previously been on 10 mg of Lasix and  10 mEq of potassium at home prior to admission.   The patient was noted to have bradycardia.  Her rates went down into the  30s and 40s at times.  She was apparently on low-dose beta blocker at  home.  This was discontinued.   The patient is noted be deconditioned.  The case manager was able to  help with assisting with home health nursing, physical therapy,  occupational therapy and home health aide.  Her activity with increased.  She ambulated with cardiac rehab.   On the morning of January 08, 2007 she was found in stable and ready  for discharge to home.  She will need follow-up with Dr. Dietrich [name] as  well as Dr. Mariel [name].    LABORATORY DATA:  At discharge white count 4400, hemoglobin 11.5,  hematocrit 34.3, platelet count 50,000 (her lowest platelet count this  admission was 35,000).  INR 1.0, sodium 144, potassium 4.5, BUN 27,  creatinine 0.89, glucose 94. cardiac markers as noted above.  Total  cholesterol 94, triglycerides 52, HDL 43, LDL 41.  BNP on September 18  408 (highest  BNP this admission 1068 on September 16).  Chest x-ray  from September 12 revealed cardiomegaly, prior barium aspiration, no  definite acute process.  Echocardiogram September 15.  Please see the  dictated report for complete details.  Briefly the distal third of the  LV appears akinetic with significant aneurysmal dilation of the apex.  There was a mid ventricular gradient depressed of almost 4 meters per  second.  EF 40-45%, mild mitral regurgitation,  left atrium moderately  dilated, mild to moderate pulmonary hypertension, moderate TR.   DISCHARGE MEDICATIONS:  1. Aspirin 81 mg daily  2. Furosemide 20 mg daily - this is increased from 10 mg.  3. Zocor 40 mg q.h.s.  4. Lisinopril 10 mg daily.  5. Potassium chloride 20 mEq daily - this is increased from 10 mEq      daily.  6. Mucinex b.i.d.  7. Calcium with vitamin D.  8. Lidocaine patches p.r.n.  9. Colace daily p.r.n.  10.Nitroglycerine p.r.n. chest pain.   She has been advised to stop her metoprolol.   DIET:  Low-fat, low-sodium diet.   WOUND CARE:  Not applicable.   ACTIVITY:  She is increase activity slowly.  She may walk up steps and  shower and bathe.  No lifting or driving for 2 weeks.   FOLLOW UP:  1. The patient will see Dr. Dietrich [name] September 24 at 1:15 p.m..  2. She should follow up Dr. Mariel [name] at his next available      appointment to follow-up on her thrombocytopenia.  The patient and      her family have been asked to contact Dr. Mariel [name] for an      appointment.  3. She should  follow up Dr. Nobie [name] as directed.   Total physician and PA time  greater than 30 minutes on this discharge.      Tereso Newcomer, PA-C      Gerrit Friends. Dawn Pates, MD, [facility]  Electronically Signed    SW/MEDQ  D:  01/08/2007  T:  01/09/2007  Job:  [id number]   cc:   [name], M.D.  Ladona Horns. Dawn Sleet, MD

## 2010-09-02 NOTE — Letter (Signed)
February 23, 2007    [name], M.D.  [address]  [place], [place] [address]   RE:  [name], [name]  MRN:  [id number]  /  DOB:  10-08-1921   Dear [name]:   [name] returns to the office for continuous assessment and  treatment of coronary disease and ischemic cardiomyopathy.  Since her  last visit, she has done superbly.  She occassionally notes back pain,  which is her anginal equivalent.  This usually responds to sublingual  nitroglycerin.  She has had some pain in the right inguinal region, that  may be related to a previous episode of Zoster in this area.  She walks  with a walker and has home health as well as great care from her  daughter.   Medications are unchanged from her last visit, except for the addition  of HCTZ 12.5 mg daily and a multivitamin.  Her last lab tests were last  month, at which time her chemistry profile was normal and her CBC was  good, except for platelets of 53,000 related to ITP.   On examination, a pleasant and fairly jocular older woman in no acute  distress.  The weight is 112, 2 pounds more than in September 2008.  Blood pressure 100/50, heart rate 56 with occasional prematures.  Respirations 16.  NECK:  No jugular venous distention; no carotid bruits.  LUNGS:  Clear.  CARDIAC:  Normal first and second heart sounds; grade 2/6 systolic  ejection murmur at the cardiac base.  ABDOMEN:  Soft and nontender; no skin eruption in the right abdomen or  right inguinal area.  EXTREMITIES:  No edema.   EKG:  Sinus bradycardia; borderline left atrial abnormality; right  bundle branch block; possible prior septal MI; LVH; T-wave abnormality,  consistent with lateral ischemia.  Compared with a prior EKG of October 28, 2004, junctional rhythm no longer present; inferior T-wave inversion  improved; lateral T-wave inversion now noted.   IMPRESSION:  [name] is doing very well.  When we most recently  tried percutaneous intervention  for a critically stenosed RCA in August  2005, the patient had a prolonged hospitalization due to congestive  heart failure and acute renal insufficiency.  I believe that  intervention in this frail woman with thrombocytopenia should be  reserved for clearly life-threatening situations.  We will continue her  current medications, monitor CBC and electrolytes, and plan a return  visit in 4 months.  We will arrange for her home health nurse to  administer influenza vaccine.    Sincerely,      Gerrit Friends. Dietrich Pates, MD, [facility]  Electronically Signed    RMR/MedQ  DD: 02/23/2007  DT: 02/24/2007  Job #: [id number]

## 2010-09-05 NOTE — Discharge Summary (Signed)
[name], SCHIANO                         ACCOUNT NO.:  [id number]   MEDICAL RECORD NO.:  [id number]                   PATIENT TYPE:  INP   LOCATION:  2905                                 FACILITY:  [facility]   PHYSICIAN:  Lehi Bing, M.D.               DATE OF BIRTH:  April 05, 1922   DATE OF ADMISSION:  11/12/2003  DATE OF DISCHARGE:  11/22/2003                           DISCHARGE SUMMARY - REFERRING   DISCHARGE DIAGNOSES:  1.  Non-ST-segment elevation myocardial infarction.  2.  Two-vessel coronary artery disease, medical management.  3.  Atrial fibrillation (aspirin plus Plavix).  4.  Chronic left ventricular thrombus.  5.  Ejection fraction 40 to 45%.  6.  Hypertension, treated.   HOSPITAL COURSE:  Ms. Concepcion is an 77 year old female who was admitted to  [facility] on November 12, 2003, with atrial fibrillation with rapid  ventricular response.  She has been treated with rate control on calcium  channel blockers and beta blockers.  There were no plans for anticoagulation  other than Plavix and aspirin secondary to high risk for difficulty with  Coumadin secondary to age, debilitation, and poor gait.   She did rule in for myocardial infarction with a maximum CK of 264, MB  fraction 55.1, troponin 7.62.  TSH was 0.601.  Hemoglobin A1C 8.2.   Cardiac catheterization revealed severe two-vessel coronary artery disease  involving the RCA and LAD.  The patient was seen in consultation by Dr. Donata Clay who felt the patient was cardiac bypass candidate.  He reviewed CT  scan and noted there was no evidence of cancer but pulmonary scars and  goiter.   We did medical management in this patient.  She did eventually undergo a  stent to her mid LAD as well as angioplasty to her distal LAD under the care  of Dr. Shawnie Pons.  Otherwise, she will be treated medically.   By November 22, 2003, she was felt to be discharge candidate.   She was discharged to home on these  medications.   DISCHARGE MEDICATIONS:  1.  Lasix 40 mg a day.  2.  Lisinopril 10 mg a day.  3.  Lipitor 20 mg a day.  4.  Plavix 75 mg a day.  5.  Cardizem CD 120 mg a day.  6.  Toprol XL 25 mg a day.  7.  Baby aspirin daily.  8.  Sublingual nitroglycerin p.r.n. chest pain.   DISCHARGE INSTRUCTIONS:  1.  No strenuous activity.  2.  No driving.  3.  Remain on low-fat diet.  4.  Clean catheterization site with soap and water, no scrubbing.  5.  Call for questions or concerns.  6.  She is to follow up with Dr. Dietrich Pates on November 27, 2003, at 1:30 p.m. in      the [place] office.      Guy Franco, P.A. [facility]  [place] Bing, M.D.    LB/MEDQ  D:  12/19/2003  T:  12/19/2003  Job:  [id number]

## 2010-09-05 NOTE — Letter (Signed)
January 06, 2006     Patrica Duel, M.D.  [address], [address]  [place], [place] [address]   RE:  [name], [name]  MRN:  [id number]  /  DOB:  05-Nov-1921   Dear Loraine Leriche:   [name] returns to the office for continued assessment and treatment of  ischemic cardiomyopathy plus multiple additional medical problems.  She was  recently admitted to [facility] with a non-Q myocardial infarction,  pneumonia and herpes zoster.  She was ultimately discharged to the [facility] and is now confined to a wheelchair, experiencing significant  swallowing problems and with malnutrition and generally doing poorly.  She  reports no dyspnea nor chest discomfort.   CURRENT MEDICATIONS:  1. Lisinopril 10 mg daily.  2. Aspirin 81 mg daily.  3. Metoprolol 12.5 mg b.i.d.  4. Simvastatin 40 mg daily.  5. Zinc 1 daily.  6. Furosemide 20 mg b.i.d.  7. Os-Cal with Vitamin D.   EXAM:  An extremely thin older woman who remains in touch mentally, in no  acute distress.  Heart rate 50 and regular, blood pressure 105/60,  respirations 16 and unlabored, O2 saturation 86% on room air.  NECK:  No jugular venous distention.  LUNGS:  Rales at the left base.  CARDIAC:  Normal first and second heart sounds; systolic murmur at the left  sternal border.  ABDOMEN:  Soft and nontender.  EXTREMITIES:  No edema.   Rhythm strip:  Sinus bradycardia; normal PR interval; borderline IVCD.   IMPRESSION:  [name] is stable from a cardiac standpoint.  Nutrition  appears to be a major issue at present.  She is on a restricted diet both in  terms of composition and consistency.  The latter is obviously important.  I  would not worry much about fat, sugar or even salt if modifying that would  result in increased caloric intake.   Her ischemic heart disease is not symptomatic at the present time.  CHF  appears to be compensated.  Despite conduction system disease, she is  tolerating very low dose  beta-blocker.  Medical regimen appears adequate at  the present time.  We will recheck a chemistry profile in 1 month and plan a  return office visit in 2 months.  She is reminded to receive influenza  vaccine when available.  Pneumococcal vaccine was administered in 2005.    Sincerely,      Gerrit Friends. Dietrich Pates, MD, [facility]   RMR/MedQ  DD:  01/06/2006  DT:  01/07/2006  Job #:  [id number]   CC:    [facility] (Dr. [place] Desanctis)

## 2010-09-05 NOTE — Procedures (Signed)
[name], [name]                         ACCOUNT NO.:  [id number]   MEDICAL RECORD NO.:  [id number]                   PATIENT TYPE:  OUT   LOCATION:  RAD                                  FACILITY:  [facility]   PHYSICIAN:  Rhome Bing, M.D.               DATE OF BIRTH:  [birth year]   DATE OF PROCEDURE:  DATE OF DISCHARGE:                                  ECHOCARDIOGRAM   REFERRING PHYSICIAN:  1. Patrica Duel, M.D.  2. [place] Bing, M.D.   CLINICAL DATA:  An 77 year old woman with cardiomegaly and hypertension.   M-MODE:  1. Aorta 2.8.  2. Left atrium 4.0.  3. Septum 1.4.  4. Posterior wall 1.2.  5. LV diastole 3.8.  6. LV systole 2.2.   FINDINGS:  1. Technically-adequate echocardiographic study.  2. Normal right atrial size.  3. Left atrial size at the upper limit of normal.  4. Normal right ventricular size; mild right ventricular hypertrophy.  5. Normal RV systolic function.  6. Normal mitral valve; minimal annular calcification; very mild     regurgitation.  7. Minimal aortic valvular sclerosis; mild annular calcification.  8. Normal tricuspid valve; very mild regurgitation; mild elevation in     estimated RV systolic pressure.  9. Normal internal dimension of the left ventricle; mild concentric left     ventricular hypertrophy.  Apex not well imaged -- may be hypokinetic or     even akinetic.  Overall, LV systolic function is normal to hyperdynamic.  10.      Normal inferior vena cava.      ___________________________________________                                            [place] Bing, M.D.   RR/MEDQ  D:  09/24/2003  T:  09/24/2003  Job:  [id number]

## 2010-09-05 NOTE — Cardiovascular Report (Signed)
[name], [name]                         ACCOUNT NO.:  [id number]   MEDICAL RECORD NO.:  [id number]                   PATIENT TYPE:  INP   LOCATION:  2002                                 FACILITY:  [facility]   PHYSICIAN:  Carole Binning, M.D. [facility]         DATE OF BIRTH:  1921/10/02   DATE OF PROCEDURE:  11/15/2003  DATE OF DISCHARGE:                              CARDIAC CATHETERIZATION   PROCEDURE PERFORMED:  1. Selective coronary angiography.  2. Abdominal aortography.   INDICATIONS:  [name] is an 77 year old woman with a history of  cardiomyopathy which has improved significantly.  She presented to the  hospital with supraventricular tachycardia as well as episodes of non-  sustained ventricular tachycardia.  She did rule in for a non-Q-wave  myocardial infarction.  An echocardiogram showed moderately decreased left  ventricular systolic function with evidence of an apical aneurysm and  laminated left ventricular thrombus.  She was referred for cardiac  catheterization to assess her coronary anatomy.  Now, abdominal aortogram is  also requested.   PROCEDURE:  A 6 French sheath was placed in the right femoral artery.  Coronary angiography was performed using standard Judkins 6 French  catheters.  We did not cross the aortic valve into the left ventricle  because of the presence of left ventricular thrombus by echocardiography.  Abdominal aortography was performed with an angled pigtail catheter.  Contrast was Omnipaque.  There were no complications.   RESULTS:  1. Hemodynamics:  Central aortic pressure is 162/80.  2. Left ventriculography was not performed for reasons described above.  3. Abdominal aortogram reveals patent renal arteries.  There is mild diffuse     atherosclerotic disease of the abdominal aorta with minimal disease of     the iliac arteries and tortuosity of the left iliac artery.  4. Coronary angiography (_________).  Left main has an ostial 30%  stenosis.     Posterior descending artery has calcification of the proximal mid vessel     with a complex 95% stenosis in the mid left anterior descending just     prior to a secondary diagonal branch.  Just beyond this 95% stenosis     there is a short aneurysmal segment of left anterior descending.  Further     down in the mid left anterior descending there is a tubular 30% stenosis.     The left anterior descending gives rise to a normal sized first diagonal     branch which has an 80% stenosis proximally.  There is also a normal     sized second diagonal branch arising from the diseased segment of the     main left anterior descending which has a 95% stenosis at its ostium.     The left anterior descending does ___________the apex and supplies the     apical wall.  The left circumflex has a tubular 20% stenosis in the     proximal  main vessel.  Circumflex gives rise to a small first obtuse     marginal, large second obtuse marginal, and small third obtuse marginal     branch.  There is a tubular 30 to 40% stenosis in the second obtuse     marginal branch.  Right coronary artery is a codominant vessel.  It has     diffuse disease with a 95% stenosis in the proximal vessel.  Throughout     the length of the mid vessel, there is a diffuse 80 to 90% stenosis.  In     the distal vessel, there is a 70% stenosis.  The distal right coronary     artery ends as a normal sized posterior descending artery.   IMPRESSION:  1. Severe and complex two vessel coronary artery disease as described.  2. 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   PLAN:  Cardiovascular surgery will be consulted to determine whether the  patient would be a candidate for coronary artery bypass surgery and possible  aneurysmectomy.                                                Carole Binning, M.D. [facility]    MWP/MEDQ  D:  11/15/2003  T:  11/15/2003  Job:  [id number]   cc:   Patrica Duel, M.D.  [address]  [place]  [place] [address]  Fax: [phone]   Perry Bing, M.D.

## 2010-09-05 NOTE — Discharge Summary (Signed)
[name], [name]               ACCOUNT NO.:  [id number]   MEDICAL RECORD NO.:  [id number]          PATIENT TYPE:  INP   LOCATION:  A212                          FACILITY:  [facility]   PHYSICIAN:  Kirk Ruths, M.D.DATE OF BIRTH:  [birth year]   DATE OF ADMISSION:  12/03/2005  DATE OF DISCHARGE:  08/24/2007LH                                 DISCHARGE SUMMARY   DATE OF TRANSFER:  December 05, 2005.   FINAL DIAGNOSIS:  1. Acute non-ST segment myocardial infarction.  2. Sick sinus syndrome.  3. History of hypertension.  4. Shingles.  5. Urinary tract infection.  6. Known ischemic myopathy.  7. History of thrombocytopenia.   HOSPITAL COURSE:  This is an elderly female who tripped and fell on the day  of admission without significant injury.  The patient was evaluated in the  emergency room where basically her examination was benign but she was  admitted due to the patient's family was unable to care for her and wanted  possible placement.  By the time I saw the patient in the office that  afternoon she did complain of some nonspecific chest pain at which time  cardiac enzymes and electrocardiogram were ordered.  Initial exam's were  negative, although the troponin was elevated and MB fraction became  significant and she did have electrocardiogram changes.  The patient was  seen in consult by Dr. Dionicio Stall from cardiology.  She appeared stable,  although she did have electrocardiogram changes from previous EKG.  The  patient's white count was 5800 on admission, hemoglobin 14.1, platelet's  were clumped.  Electrolytes were within the normal range.  Creatinine was  1.3.  The patient's chest x-ray was normal.  Urinalysis showed some  hemoglobin, otherwise insignificant findings.  The patient was transferred  to Dr. Marvel Plan care at [facility] for further treatment for myocardial  infarction.  She was treated with Lovenox, Aspirin and Lopressor.      Kirk Ruths, M.D.  Electronically Signed     WMM/MEDQ  D:  12/28/2005  T:  12/28/2005  Job:  [id number]

## 2010-09-05 NOTE — Procedures (Signed)
NAMETRITIA, ENDO NO.:  [id number]   MEDICAL RECORD NO.:  [id number]          PATIENT TYPE:  REC   LOCATION:                                FACILITY:  [facility]   PHYSICIAN:  Charlton Haws, M.D.     DATE OF BIRTH:  1921/07/25   DATE OF PROCEDURE:  DATE OF DISCHARGE:                                    STRESS TEST   HISTORY:  [name] is an 77 year old female with known coronary disease  status post stent to her  LAD in August 2005. She also had a PTCA to her mid  LAD, and at the same time, she had some residual stenoses - 90% RCA, 80%  first diagonal, 95% second diagonal, 40% second marginal, apical thrombus  and an EF of 45%. The patient also has a history of sick sinus syndrome and  junctional rhythm.   Upon presentation, [name] is noted to be in an intermittent junctional  rhythm at rest. She is asymptomatic. Considering this, I discussed further  testing with Dr. Eden Emms to felt adenosine should be cancelled. He felt that  dobutamine would likely trigger atrial fibrillation. His recommendation was  to cancel the stress test and to have the patient follow up with Dr.  Dietrich Pates for possible further evaluation with cardiac catheterization. [name] is unable to walk on a treadmill. I have arranged follow-up for [name] with Dr. Tenny Craw part.      Amy Mercy Riding, P.A. [facility]    ______________________________  Charlton Haws, M.D.    AB/MEDQ  D:  01/30/2005  T:  01/30/2005  Job:  [id number]

## 2010-09-05 NOTE — Procedures (Signed)
NAMELUTIE, PICKLER               ACCOUNT NO.:  [id number]   MEDICAL RECORD NO.:  [id number]          PATIENT TYPE:  EMS   LOCATION:  ED                            FACILITY:  [facility]   PHYSICIAN:  Edward L. Juanetta Gosling, M.D.DATE OF BIRTH:  1921-09-04   DATE OF PROCEDURE:  DATE OF DISCHARGE:                                EKG INTERPRETATION   TIME:  1806 hours on June 02, 2005.   RESULTS:  The rhythm is sinus rhythm.  The axis is leftward, but does not  meet criteria for left axis deviation.  There is an interventricular  conduction delay.  There are ST and T wave abnormalities seen anteriorly and  laterally which could indicate ischemia.  Clinical correlation is suggested.  There is a suggestion of some ST elevation in at least limb lead III as  well.   IMPRESSION:  Abnormal electrocardiogram.      Edward L. Juanetta Gosling, M.D.  Electronically Signed     ELH/MEDQ  D:  06/03/2005  T:  06/04/2005  Job:  [id number]

## 2010-09-05 NOTE — Procedures (Signed)
[name], [name]               ACCOUNT NO.:  [id number]   MEDICAL RECORD NO.:  [id number]          PATIENT TYPE:  INP   LOCATION:  A212                          FACILITY:  [facility]   PHYSICIAN:  Edward L. Juanetta Gosling, M.D.DATE OF BIRTH:  [birth year]   DATE OF PROCEDURE:  12/03/2005  DATE OF DISCHARGE:                                EKG INTERPRETATION   DATE:  December 03, 2005;  17:13.   INTERPRETATION:  The rhythm is sinus rhythm and there is sinus arrhythmia.  There is a right bundle branch block.  There is a left anterior hemiblock.  There is left ventricular hypertrophy with strain.   IMPRESSION:  Abnormal electrocardiogram.      Edward L. Juanetta Gosling, M.D.  Electronically Signed     ELH/MEDQ  D:  12/04/2005  T:  12/04/2005  Job:  [id number]

## 2010-09-05 NOTE — Discharge Summary (Signed)
[name], [name]               ACCOUNT NO.:  [id number]   MEDICAL RECORD NO.:  [id number]          PATIENT TYPE:  INP   LOCATION:  2003                         FACILITY:  [facility]   PHYSICIAN:  Arturo Morton. Riley Kill, MD, FACCDATE OF BIRTH:  Mar 22, 1922   DATE OF ADMISSION:  12/05/2005  DATE OF DISCHARGE:  12/11/2005                           DISCHARGE SUMMARY - REFERRING   PROCEDURES:  1. A 2-D echocardiogram.  2. Sputum culture.  3. Blood culture.   TIME AT DISCHARGE:  46 minutes.   PRIMARY DIAGNOSIS:  Non-ST segment elevation myocardial infarction.   SECONDARY DIAGNOSES:  1. Pneumonia.  2. Shingles.  3. History of coronary artery disease with most recent intervention being      a Taxus stent to the left anterior descending in 2005.  4. Hypertension.  5. Hyperlipidemia.  6. Recent urinary tract infection.  7. History of sick sinus syndrome with junctional bradycardia.  8. Weakness and deconditioning.  9. History of thrombocytopenia.  10.History of a fall with weakness.   HOSPITAL COURSE:  [name] is an 77 year old female with known coronary  artery disease.  She went to [facility] on December 03, 2005, after a  fall.  She had weakness.  She was seen by Dr. Dorethea Clan on December 04, 2005, for  elevated cardiac enzymes.  It was felt that she needed further management  and she was transferred to [facility]. [facility].  She had been  started on antibiotics for urinary tract infection but there was concern  because of a possible pneumonia and this was changed to Avelox.  She is to  continue the Avelox p.o. for seven more days.   She had no further episodes of chest pain and Dr. Riley Kill discussed the case  extensively with the patient and her family. It was felt that medical  therapy was the best option. She remained pain-free and had no further  complaints of chest pain.   [name] had been diagnosed with shingles on December 01, 2005, and she  was on Famvir  and Darvocet for this and these were continued.  Her shingles  were improving and the Famvir is to continue for seven more days with  Darvocet to be continued on a p.r.n. basis.   Because of her fall and her weakness, a physical therapy consult was called.  She was requiring total assist on some things and moderate assist on some  other parts of her ADLs.  It was felt that a bedside commode was the best  option for her.  The situation was discussed with the family and it was felt  that nursing home placement was her best option.   By December 11, 2005, [name] had several bed offers.  Her family had not  selected one, but this is pending.  A sputum culture had been done which  showed normal flora.  Chest x-ray done December 09, 2005, showed patchy  opacity at the left base but improved aeration.  She has a history of  thrombocytopenia but her last platelet count was 164,000.  Pending  evaluation by Dr.  Riley Kill, she is tentatively considered stable for  discharge to a skilled nursing facility on December 11, 2005, with outpatient  follow-up arranged.   DISCHARGE INSTRUCTIONS:  1. Her activity level is to include physical therapy three times a week      and occupational therapy twice a week.  2. She is to be on a carbohydrate moderate heart-healthy diet.  3. She is to follow up with Dr. Dietrich Pates on December 23, 2005, at 2:45      p.m.  4. She is to have a bedside commode.  5. She is to have a rolling walker and wheelchair.   DISCHARGE MEDICATIONS:  1. Avelox 400 mg daily for seven more days.  2. Aspirin 81 mg daily.  3. Famvir 500 mg t.i.d. for seven more days.  4. Lisinopril 10 mg a day.  5. Metoprolol 12.5 mg b.i.d.  6. Zocor 40 mg nightly.  7. Guaifenesin 600 mg b.i.d.  8. Multivitamin daily.  9. Colace 100 mg daily.  10.Sublingual nitroglycerin p.r.n.  11.Desyrel 25 mg nightly p.r.n.  12.Lidocaine patch, on 12 hours and off 12 hours.  13.Darvocet N 100 one or two tablets  q.6h. p.r.n.     ______________________________  Theodore Demark, PA-C      Arturo Morton. Riley Kill, MD, [facility]  Electronically Signed    RB/MEDQ  D:  12/11/2005  T:  12/11/2005  Job:  [id number]   cc:   Patrica Duel, M.D.

## 2010-09-05 NOTE — H&P (Signed)
[name], [name]                         ACCOUNT NO.:  [id number]   MEDICAL RECORD NO.:  [id number]                   PATIENT TYPE:  INP   LOCATION:  1824                                 FACILITY:  [facility]   PHYSICIAN:  Watchung Bing, M.D.               DATE OF BIRTH:  Aug 31, 1921   DATE OF ADMISSION:  11/12/2003  DATE OF DISCHARGE:                                HISTORY & PHYSICAL   PRIMARY CARE PHYSICIAN:  Patrica Duel, M.D.   PRIMARY CARDIOLOGIST:  [place] Bing, M.D.   HISTORY OF PRESENT ILLNESS:  An 77 year old woman with resolved  cardiomyopathy, referred to the emergency department from her primary care  physician's office where she presented with back and shoulder pain and SVT.   [name] was previously cared for by Dr. Daleen Squibb who first saw her when she  had a significant cardiomyopathy with an ejection fraction of approximately  0.25.  She did extremely well clinically, and recently came to see me in the  office where a repeat echocardiogram demonstrated near normal left  ventricular systolic function.  She did well until this afternoon when she  noted back pain radiating to both shoulders and malaise.  She had no  lightheadedness nor syncope.  There was no dyspnea nor diaphoresis.  She was  seen by the [facility] and found to have a supraventricular tachycardia  prompting transport to the emergency department at [facility] by  [facility].  In the emergency department, she received Diltiazem and converted  to sinus rhythm.  The exact temporal course is not clear.  With resolution  of her tachycardia, her back and shoulder discomfort also appears to have  resolved.  It seems that she is back to baseline at the present time.   PAST MEDICAL HISTORY:  Notable for hypertension which has been well  controlled by medical therapy.  She has never undergone surgery.  She was  recently found to be anemic and to have elevated blood glucose.  These  problems were  referred to her primary care doctor - the treatment and  evaluation offered is not available at the present time.   ALLERGIES:  The patient reports no allergies.   CURRENT MEDICATIONS:  1. Paxil 10 mg daily.  2. Furosemide 40 mg daily for pedal edema.  3. Lisinopril 10 mg daily for hypertension.  4. Toprol 25 mg daily was recently discontinued as was aspirin.   SOCIAL HISTORY:  Lives in [place] and receives assistance from her daughter.  Her husband is in a nursing home.  No history of tobacco or alcohol use.   FAMILY HISTORY:  Vague;  father suffered a fatal CVA.  Siblings have no  vascular disease.   REVIEW OF SYSTEMS:  Notable for generalized weakness, arthralgias,  intermittent nausea, and chronic mild dyspnea on exertion.  All other  systems reviewed and are negative.   PHYSICAL EXAMINATION:  GENERAL:  Elderly-appearing,  thin woman in no acute  distress.  VITAL SIGNS:  Temperature 98.6, heart rate initially 144;  subsequently 90  and somewhat irregular, respirations 20, blood pressure 115/70, O2  saturation 96% on room air.  HEENT:  Anicteric sclerae;  normal lids and conjunctivae.  SKIN:  No significant lesions.  NECK:  No jugular venous distention;  no carotid bruits.  ENDOCRINE:  No thyromegaly.  HEMATOPOIETIC:  No cervical, axillary, nor inguinal adenopathy.  LUNG:  Moderate bibasilar rales.  CARDIAC:  Normal first and second heart sounds;  fourth heart sound present;  grade 2/6 systolic ejection murmur.  ABDOMEN: Soft and nontender;  no masses;  no organomegaly;  normal bowel  sounds;  no bruits.  EXTREMITIES:  Trace edema on the left;  distal pulses intact.  NEUROMUSCULAR:  Symmetric strength and tone.  MUSCULOSKELETAL:  No joint deformities.   Chest x-ray:  Cardiomegaly;  borderline mediastinal widening;  no acute  pulmonary findings.   EKG:  Initial supraventricular tachycardia at a rate of 148;  associated ST-  T wave abnormalities;  voltage criteria for left  ventricular hypertrophy;  possible prior septal myocardial infarction;  probably not atrial flutter  with 2:1 A-V block.   Other laboratories notable for normal renal function, glucose of 167,  potassium of 3, and negative point of care markers.   IMPRESSION:  Elderly woman with a history of idiopathic cardiomyopathy that  resolved spontaneously, now presenting with atrial arrhythmias and back  discomfort could be an anginal equivalent or could simply be related to her  supraventricular tachycardia.  She certainly merits admission for serial  cardiac markers and electrocardiograms.  Diltiazem will be continued, since  this appears to have converter her arrhythmia to sinus rhythm.  If this, in  fact, occurred in close temporal relation, then she probably has A-V nodal  re-entry.  An electrocardiogram will be obtained in sinus rhythm to look for  accessory pathway or other abnormalities that help to explain her  arrhythmia. She recently had an echocardiogram that demonstrated mild  concentric left ventricular hypertrophy with normal left ventricular  systolic function and no significant valvular abnormalities.  A thyroid  stimulating hormone level will be obtained.  If markers remain negative and  her arrhythmia and symptoms do not recur, an outpatient Cardiolite study  will be requested.  Due to the possibility of coronary disease and possible  mild benefit in atrial arrhythmias, aspirin therapy will be resumed.                                                [name], M.D.    RR/MEDQ  D:  11/12/2003  T:  11/12/2003  Job:  [id number]

## 2010-09-05 NOTE — Cardiovascular Report (Signed)
[name], [name]                         ACCOUNT NO.:  [id number]   MEDICAL RECORD NO.:  [id number]                   [name] TYPE:  INP   LOCATION:  2920                                 FACILITY:  [facility]   PHYSICIAN:  Arturo Morton. Riley Kill, M.D. [facility]         DATE OF BIRTH:  1921/09/14   DATE OF PROCEDURE:  11/19/2003  DATE OF DISCHARGE:                              CARDIAC CATHETERIZATION   INDICATIONS:  [name] underwent cardiac catheterization which  demonstrated severe disease of Dawn right coronary and LAD.  She was seen by  Dr. Gerri Spore.  He had her seen by Dr. Donata Clay who felt that surgery was  not indicated because of [name]'s frail functional status.  Dawn  situation was reviewed and it was felt that Dawn best option would be for  consideration of stenting of Dawn LAD.  Dawn spoke then subsequently with Dr.  Dietrich Pates who felt that we should probably leave Dawn right coronary alone.  [name] is at high risk.  She also has evidence of laminated embolus in  Dawn left ventricle, but is not felt to be a Coumadin candidate.   PROCEDURES:  1. Percutaneous stenting of left anterior descending artery, mid.  2. Percutaneous angioplasty of Dawn distal left anterior descending artery.   DESCRIPTION OF PROCEDURE:  [name] was brought to Dawn catheterization  lab and prepped and draped in Dawn usual fashion.  Through an anterior  puncture, Dawn  left femoral artery was easily entered.  A 7 French sheath  was placed.  There was some iliac stenosis, but we were able to navigate  this area with a J wire.  Angiomax was given according to protocol and ACTs  checked.  Subsequently, Dawn JL-3 guiding catheter was utilized to intubate  Dawn left main.  Dawn lesion was then crossed with a Hi-Torque Floppy wire.  We placed initially a 2.5 mm balloon and there was predilatation done.  We  then placed a 20 x 2.5 mm Taxus drug-eluting stent across Dawn lesion.  This  was taken up to 14  atmospheres then subsequently post dilated with 3 mm  balloon.  There was an excellent angiographic response.  We then tried to  place a stent to a distal lesion through Dawn previously placed stent, but  could never get around Dawn aneurysmal area.  This area was then dilated with  a 2.5 mm balloon with an excellent angiographic appearance.  We then tried  again to pass Dawn stent down with a buddy wire system and we were unable to  pass with Dawn buddy wire.  Subsequently, [name] was fairly  uncooperative in Dawn lab and Dawn reviewed again with Dr. Dietrich Pates and we  elected not to do Dawn right coronary. Given her overall basic status, he  felt that she should be treated medically.   ANGIOGRAPHIC DATA:  1. Dawn left main coronary artery was free of significant disease.  2. Dawn LAD demonstrates severely diseased lesion leading into an area of     aneurysmal dilatation after Dawn take off of Dawn diagonal and septal     perforator.  There is at least 95% stenosis.  Following stenting, Dawn     vessel opened up and was without critical narrowing.  We ended Dawn stent     just inside Dawn aneurysmal area to avoid covering Dawn area.  There was a     lesion distally which was about 70-75%.  Following balloon dilatation,     this was about 30% and we attempted to place a stent across Dawn     aneurysmal area of dilatation even with Dawn buddy wire system and could     not get a drug-eluting stent down to this area.  This area was reduced     from 70% down to about 30% with a nice angiographic appearance.  Dawn     remainder of Dawn LAD was without critical narrowing except for some     irregularities distally.   CONCLUSION:  1. Successful percutaneous stenting of Dawn mid left anterior descending     artery.  2. Successful percutaneous angioplasty of Dawn left anterior descending     artery beyond Dawn aneurysmal area of dilatation.   DISPOSITION:  This is a difficult situation.  [name] is 38 with  poor  baseline status.  [name] to Dr. Dietrich Pates.  Appearance  was excellent with reduction in Dawn stenosis from 99 to 0%.                                               Arturo Morton. Riley Kill, M.D. [facility]    TDS/MEDQ  D:  11/19/2003  T:  11/20/2003  Job:  [id number]   cc:   Patrica Duel, M.D.  [address], [address]  [place]  [place] [address]  Fax: [phone]

## 2010-09-05 NOTE — Consult Note (Signed)
NAMEKERSTEN, SALMONS               ACCOUNT NO.:  [id number]   MEDICAL RECORD NO.:  [id number]          PATIENT TYPE:  INP   LOCATION:  A212                          FACILITY:  [facility]   PHYSICIAN:  Farris Has. Dorethea Clan, MD  DATE OF BIRTH:  May 23, 1921   DATE OF CONSULTATION:  DATE OF DISCHARGE:                                   CONSULTATION   PRIMARY CARE PHYSICIAN:  Patrica Duel, M.D.   HISTORY OF PRESENT ILLNESS:  [name] is an 77 year old woman with  severe coronary disease.  She is followed by my colleague Dr. Donnamarie Rossetti  in our clinic.  She presented to the emergency department at [facility] yesterday after a fall at home.  She complained of chest  discomfort.  This is not well documented in the physical exam; however, she  appears to have had abnormal cardiac enzymes in the pattern consistent with  an acute myocardial infarction.  She denies any chest discomfort now, no  shortness of breath.  She was resting comfortably when I saw her initially.  She appears to have been admitted because of weakness.  There is some  documentation of chest discomfort on the review of systems.   PAST MEDICAL HISTORY:  Significant for an ischemic cardiomyopathy with  ejection fraction of about 45% on a catheterization done in 2005.  Subsequent echocardiogram done in August 2006 shows ejection fraction of  greater than 75%.  She has coronary artery disease status post multiple  coronary interventions, the most recent of which was by Dr. Riley Kill in  August of 2005 where she had percutaneous revascularization of her left  anterior descending coronary artery with a 20 x 2.5 Taxus stent.  She also  had balloon angioplasty of the distal LAD.  She has had substantial work  done to all three of her coronary arteries over the course of the last  several years.  She has a history of sick sinus syndrome with junctional  bradycardia.  She has a history of hypertension.  She has recently been  treated for a urinary tract infection and also shingles.   PAST SURGICAL HISTORY:  Not documented and I do not know.  She is unable to  give a thorough history of that and review of systems is generally negative.   SOCIAL HISTORY:  She lives with her daughter in [place].  She does not  smoke, drink or use illicit drugs.   CURRENT MEDICATIONS:  Aspirin 81 mg once a day, ciprofloxacin 250 mg 3 times  a day, Famvir 500 mg 3 times a day, Imdur 120 mg once a day, lisinopril 10  mg once a day, Lopressor 12.5 mg twice a day, spironolactone 25 mg b.i.d.  and she is on [facility] p.r.n. orders.   PHYSICAL EXAM:  GENERAL:  She is an elderly black female in no apparent  distress who is mildly confused.  Difficult to get a clear history from her.  She is very sleepy and tired, but is able to deny chest pain and shortness  of breath.  VITAL SIGNS:  Heart rate 70, blood  pressure 135/64, respirations are 20.  She is afebrile.  HEENT:  Normocephalic, atraumatic.  NECK:  Without significant jugular venous distension.  She has no carotid  bruits or thyromegaly.  CHEST:  Decreased breath sounds at the bases.  No  rales are noted.  CARDIOVASCULAR:  Regular.  I do not hear a murmur.  First and second heart  sounds are normal.  ABDOMEN:  Soft, nontender.  LOWER EXTREMITIES:  Without clubbing, cyanosis or edema.  Pulses are  diminished somewhat in her feet.  No bruits are noted in her femoral  triangle bilaterally.  NEUROLOGIC:  Grossly nonfocal.  GU:  Breast and rectal exam are deferred.   Chest x-ray shows cardiomegaly without congestive heart failure.  Cardiac  enzymes initial set with CK of 300, MB of 5.0, troponin of 0.24, second set  with CK of 313, MB of 14.7, troponin of 5.34, third set with CK of 388, MB  of 8.3, troponin of 2.84.  White blood cell count 4.9, H&H of 15 and 43,  platelets are 76,000 and clumped, but appear to be normal in number.  Sodium  135, potassium 4.2, chloride 100,  bicarbonate 28, BUN 19, creatinine 1.1,  blood sugar 111.   IMPRESSION:  1. This is a lady with what appears to be an acute non-ST segment      elevation myocardial infarction.  The details are unclear as to the      onset of the chest discomfort.  She does have a peak troponin of about      5.5.  Looking at her EKG, she has markedly abnormal EKG with normal      sinus rhythm, right bundle branch block, left anterior fascicular block      with LVH.  Comparing this to previous EKGs, it looks like her axis has      shifted significantly.  Previous axis on an EKG in July 2006 shows QRS      axis of about 80 which is normal.  She currently has a QRS axis of -51.  2. Known coronary disease with ischemic myopathy.  Last LV systolic      function, however, was preserved.  3. Sick sinus syndrome.  4. Hypertension which is reasonably well-controlled.  5. Recent urinary tract infection.  6. Recent shingles.   PLAN:  To continue her aspirin, add Lovenox and nitroglycerin.  Transfer to  [facility] as per the desires of her primary.  It is not clear to me exactly  how this chest pain syndrome started, but it is apparent that she has  abnormal cardiac enzymes.  Further management will be by her primary  cardiologist who is the accepting physician at [facility].      Farris Has. Dorethea Clan, MD  Electronically Signed     JMH/MEDQ  D:  12/04/2005  T:  12/04/2005  Job:  [id number]

## 2010-09-05 NOTE — H&P (Signed)
NAMEDALAYSIA, HARMS               ACCOUNT NO.:  [id number]   MEDICAL RECORD NO.:  [id number]          PATIENT TYPE:  INP   LOCATION:  A328                          FACILITY:  [facility]   PHYSICIAN:  Kirk Ruths, M.D.DATE OF BIRTH:  [birth year]   DATE OF ADMISSION:  12/03/2005  DATE OF DISCHARGE:  LH                                HISTORY & PHYSICAL   CHIEF COMPLAINT:  Weakness after fall.   HISTORY OF PRESENT ILLNESS:  This is an elderly female who recently was seen  in the office and treated for urinary tract infection as well as shingles.  The patient according to family members got tripped up and fell today. The  patient was having difficulty ambulating with her walker. The patient was  evaluated in the emergency room and found to have no significant tenderness  or pain. She did have a total CPK of 57. Otherwise normal CBC and MET 7. The  patient's family feels they are unable to care for her now, so she will be  admitted for further evaluation and treatment and possible placement.   ALLERGIES:  She is allergic to no medications.   MEDICATIONS:  She currently takes:  1. Lipitor 20 daily.  2. Aspirin 81 mg daily.  3. Spironolactone 25 twice a day.  4. Cipro 500 twice a day.  5. Isosorbide 120 daily.  6. Lisinopril 10 mg daily.  7. Famvir for shingles.   PAST MEDICAL HISTORY:  1. The patient has a history of coronary artery disease followed by Dr.      Dietrich Pates.  2. Hypertension.  3. History of thrombocytopenia.   REVIEW OF SYSTEMS:  The patient states she is having some chest pain in the  left area of her anterior chest. Denies any shortness of breath, nausea,  vomiting, diarrhea or pain in any of her extremities.   SOCIAL HISTORY:  The patient lives with her daughter. Denies smoking or  alcohol use.   PHYSICAL EXAMINATION:  GENERAL:  An elderly female who appears weak, talks  with a very soft mumbly voice.  VITAL SIGNS:  She is afebrile. Pulse is 60 and regular.  Respirations are 20  and unlabored. Her blood pressure is 155/75.  HEENT:  TMs are normal. Pupils are equal, round, and reactive to light and  accommodation. Oropharynx benign.  NECK:  Supple without JVD, bruit, or thyromegaly.  LUNGS:  Clear in all areas.  HEART:  Regular sinus rhythm without murmur, gallop or rub. Mild left  anterior chest wall pain.  ABDOMEN:  Soft and nontender.  EXTREMITIES:  Without clubbing, cyanosis, or edema. There is a significant  shingles rash on right buttocks and right thigh.      Kirk Ruths, M.D.  Electronically Signed     WMM/MEDQ  D:  12/03/2005  T:  12/03/2005  Job:  [id number]

## 2010-09-05 NOTE — Discharge Summary (Signed)
[name], [name]               ACCOUNT NO.:  [id number]   MEDICAL RECORD NO.:  [id number]          PATIENT TYPE:  INP   LOCATION:  3705                         FACILITY:  [facility]   PHYSICIAN:  Vida Roller, M.D.   DATE OF BIRTH:  03/14/1922   DATE OF ADMISSION:  06/02/2005  DATE OF DISCHARGE:  06/04/2005                                 DISCHARGE SUMMARY   PRIMARY CARDIOLOGIST:  Dr. Play[place] Fortuna Bing   PRIMARY CARE PHYSICIAN:  Dr. Patrica Duel   PRINCIPAL DIAGNOSIS:  Left shoulder and back pain.   OTHER DIAGNOSES:  1.  Coronary artery disease status post PCI and stenting of the left      anterior descending in August of 2005.  2.  History of ischemic cardiomyopathy with most recent ejection fraction      being greater than 75% in August of 2006.  3.  Hypertension.  4.  Anemia.  5.  Hyperlipidemia.  6.  History of junctional rhythm precluding use of beta blockers.   ALLERGIES:  No known drug allergies.   PROCEDURES:  None.   HISTORY OF PRESENT ILLNESS:  77 year old African-American female with  history of CAD status post PCI of the LAD in August 2005 with residual RCA  disease and a non-ischemic functional study in the fall of 2006.  On  June 02, 2005 in the afternoon she developed pretty bad left shoulder  pain that moved to the left scapular area without associated symptoms and  was initially relieved by nitroglycerin, but recurred prompting her to  present to [facility].  On further questioning her family said that  she had been having similar pain over the past three weeks requiring  increased sublingual nitroglycerin usage.  On arrival at [facility] she was  noted to have an elevated CK-MB of 7.8 with a CK of 76 and a slightly  elevated troponin I of 0.5.  Decision was made to transfer to [facility] for  further evaluation.   HOSPITAL COURSE:  On arrival to [facility] she was pain-free on  nitroglycerin infusion at 30 mcg per minute as well as heparin  infusion.  Her CK remained negative while her MB went from 7.8 to 7 to 7.3 with  troponin from 0.05 to 0.09.  Her EKG did appear slightly worse than previous  with 0.5-1 mm inferior ST elevation with T-wave inversion V2-V3.  Given the  inconsistent trending of her cardiac markers with normal CK it was felt that  this was not an MI.  She underwent x-rays of her cervical and thoracic spine  which showed degenerative joint disease without any acute changes.  By  February 14 her pain had completely relieved and IV heparin and  nitroglycerin were discontinued.  It was not clear if her fairly non-  descript shoulder and back pain were an anginal equivalent and Norvasc was  added to her daily regimen.  To make room for this her HCTZ was  discontinued.  This morning she has no additional complaints and eager for  discharge.  She is being discharged home today in satisfactory condition.  DISCHARGE LABORATORIES:  Hemoglobin 12.2, hematocrit 34.8, WBC 4.8,  platelets 70, MCV 4.6.  Sodium 132, potassium 4.3, chloride 103, CO2 22, BUN  19, creatinine 1.1, glucose 145, INR 1.2.  Total bilirubin 0.5, alkaline  phosphatase 65, AST 28, ALT 29, albumin 3.5.  Total cholesterol 87,  triglycerides 36, HDL 40, LDL 40.  CK 116, MB 7.3, troponin I 0.09.  Calcium  8.7.  Urinalysis was negative.  TSH was normal at 1.434.   DISPOSITION:  Patient is being discharged home today in good condition.   FOLLOW-UP PLANS AND APPOINTMENTS:  She has a follow-up appointment with Dr.  Marvel Plan P.A. in [place] June 18, 2005 at 1:30 p.m.  She is also asked  to follow up with primary care physician, Dr. Patrica Duel in the next  three to four weeks.   DISCHARGE MEDICATIONS:  1.  Aspirin 81 mg daily.  2.  Lipitor 20 mg daily.  3.  Lisinopril 10 mg daily.  4.  Spironolactone 25 mg daily (this was previously b.i.d.).  5.  Nitroglycerin 0.4 mg sublingual p.r.n.  6.  Norvasc 2.5 mg daily.   OUTSTANDING LABORATORY  STUDIES:  None.   DURATION OF DISCHARGE ENCOUNTER:  40 minutes including physician time.      Dawn Anis, NP      Vida Roller, M.D.  Electronically Signed    CRB/MEDQ  D:  06/04/2005  T:  06/04/2005  Job:  [id number]   cc:   Patrica Duel, M.D.  Fax: [phone]

## 2010-09-05 NOTE — Procedures (Signed)
[name], [name]               ACCOUNT NO.:  [id number]   MEDICAL RECORD NO.:  [id number]          PATIENT TYPE:  OUT   LOCATION:  RAD                           FACILITY:  [facility]   PHYSICIAN:  Vida Roller, M.D.   DATE OF BIRTH:  June 08, 1921   DATE OF PROCEDURE:  11/18/2004  DATE OF DISCHARGE:                                  ECHOCARDIOGRAM   PRIMARY CARE PHYSICIAN:  Dr. Nobie Putnam.   TAPE NUMBER:  [id number].   TAPE COUNT:  [id number].   Previous echocardiogram from June 2005.  This is for the assessment for LV  systolic function in a woman with hypertension who is 77 years old.   TECHNICAL QUALITY OF THE STUDY:  Adequate.   M-MODE TRACINGS:  Aorta is 34 mm.   Left atrium is 45 mm.   Septum is 13 mm.   Posterior wall is 10 mm.   Left ventricular diastolic dimension is 35 mm.   Left ventricular systolic dimension is 19 mm.   2-D AND DOPPLER IMAGING:  The left ventricle is normal size with vigorous  left ventricular systolic function, estimated ejection fraction greater than  75%.  The area of the apex is difficult to image.  It appears to be  aneurysmal.  There is some question as to whether or not there is a  ventricular septal defect in this area.  There appears to be some evidence  on color-flow Doppler of both left-to-right and right-to-left flow, but the  quality of the image is such that it is very difficult to localize this  area.   The right ventricle is mildly dilated.  There is depressed RV systolic  function.  The septum appears to be flattened in a manner consistent with  increased pulmonary pressures.   Both atria are dilated.   The aortic valve is sclerotic with no evidence of stenosis or regurgitation.   The mitral valve has mild regurgitation.  No stenosis is seen.   The tricuspid valve has at least moderate regurgitation, which is directed  towards the atrial septum.  There does not appear to be any significant  reversal of flow in the hepatic vein,  although this was not well studied on  the subcostal views.   Pulmonic valve was not well seen.   There is no pericardial effusion.   The ascending aorta is not well seen.   The inferior vena cava was also not well seen.   ASSESSMENT:  In comparison to the previous study, the area of the apex was  not well seen on the previous study as well.  This is moderately concerning.  There appears to be some increase in the right ventricular systolic pressure  from previous study.  Clinical correlation is advised.  Consideration should  be made potentially for transesophageal echo if clinical concern exists for  intercardiac shunt.       JH/MEDQ  D:  11/18/2004  T:  11/19/2004  Job:  [id number]   cc:   Patrica Duel, M.D.  [address]  [place]  [place] [address]  Fax: [phone]

## 2010-09-05 NOTE — Letter (Signed)
March 05, 2006    Patrica Duel, M.D.  [address]  [place], [place] [address]   RE:  Dawn, [name]  MRN:  [id number]  /  DOB:  1921/12/13   Dear Loraine Leriche:   [name] returns to the office for continued assessment and treatment of  ischemic cardiomyopathy and sick sinus syndrome.  Since her last visit, she  was admitted to [facility] with pneumonia and subsequently readmitted  to the [facility].  She has had a number of falls there, but these have  stopped since it was arranged for her to be accompanied when walking.  It  sounds as if she was found to be aspirating and has had assistance from  speech therapy with eating and swallowing.  She is maintained on continuous  oxygen.  She is scheduled to be discharged to home soon.  She reports  increased congestion since her pneumonia, particularly at night.   CURRENT MEDICATIONS:  1. Lisinopril 10 mg daily.  2. Aspirin 81 mg daily.  3. Metoprolol 12.5 mg b.i.d.  4. Simvastatin 40 mg daily.  5. Furosemide 40 mg daily.  6. KCl 20 mEq daily.   PHYSICAL EXAMINATION:  GENERAL:  Thin, pleasant, alert woman in no acute  distress.  VITAL SIGNS: Heart rate 60 and regular, blood pressure 100/60, respirations  20.  Oxygen saturation on 2 liters of nasal oxygen: 97%.  Oxygen saturation  on room air: 93%.  HEENT:  Mild temporal wasting.  NECK: Mild external jugular distention.  LUNGS: Intermittent inspiratory and expiratory rhonchi; good air movement;  no rales.  CARDIAC: Normal first and second heart sounds; fourth heart sound present.  Modest systolic ejection murmur.  ABDOMEN:  Soft and nontender; no organomegaly.  EXTREMITIES: No edema.   IMPRESSION:  Records from [facility] and [facility] have been  requested.  It appears that [name] developed a pneumonia related to  aspiration and is now improving.  Heart failure appears compensated.  Sick  sinus syndrome is adequately controlled with her  current medications.  We  will collect information on recent x-rays and lab.  Additional testing, if  required, will be arranged.  I would like to see this nice woman again in 2  months.  Vaccinations are up to date.    Sincerely,      Gerrit Friends. Dietrich Pates, MD, [facility]  Electronically Signed    RMR/MedQ  DD: 03/05/2006  DT: 03/05/2006  Job #: [id number]

## 2010-09-05 NOTE — Procedures (Signed)
NAMECERISSA, [name]               ACCOUNT NO.:  [id number]   MEDICAL RECORD NO.:  [id number]          PATIENT TYPE:  INP   LOCATION:  A212                          FACILITY:  [facility]   PHYSICIAN:  Edward L. Juanetta Gosling, M.D.DATE OF BIRTH:  1921/10/07   DATE OF PROCEDURE:  DATE OF DISCHARGE:  12/05/2005                                EKG INTERPRETATION   The rhythm is sinus rhythm with a rate in the 70s.  There is right bundle  branch block.  There is left anterior hemiblock.  Left ventricular  hypertrophy is seen and this may be related more to the bundle branch block.   Abnormal electrocardiogram.      Oneal Deputy. Juanetta Gosling, M.D.  Electronically Signed     ELH/MEDQ  D:  12/07/2005  T:  12/07/2005  Job:  [id number]

## 2010-09-05 NOTE — Letter (Signed)
May 14, 2006    Patrica Duel, M.D.  [address], [address]  [place], [place] [address]   RE:  [name], [name]  MRN:  [id number]  /  DOB:  08-Apr-1922   Dear [name]:   [name] returns to the office for continuing assessment and  treatment of ischemic cardiomyopathy and hypertension.  Since her last  visit 3 months ago, she has improved substantially.  At that time, she  had just been discharged from a skilled nursing facility after having  experienced a serious pneumonia and aspiration.  Her daughter is doing  beautifully with her, assisting her to walk short distances with a  walker and maintaining the speech therapy techniques to prevent  aspiration.  [name] has a good appetite.  There has not been  notable problems to suggest recurrent aspiration.   CURRENT MEDICATIONS:  1. Lisinopril 10 mg daily.  2. Simvastatin 40 mg daily.  3. Furosemide 10 mg daily.  4. Metoprolol 12.5 mg daily.  5. Aspirin 81 mg daily.  6. Nocturnal oxygen.   EXAMINATION:  Pleasant soft-spoken elderly woman.  The weight is 111, 4  pounds less than in March of last year.  Blood pressure 90/60, heart  rate 60 and regular, respirations 16.  NECK:  No jugular venous distention; no carotid bruits.  CHEST:  Moderate kyphosis; clear lung fields with somewhat decreased  breath sounds at the right base.  CARDIAC:  Normal first and second heart sounds; grade 2/6 systolic  ejection murmur.  ABDOMEN:  Soft and nontender, no organomegaly.  EXTREMITIES:  No edema.   IMPRESSION:  [name] is doing beautifully with her current therapy.  A complete blood count and chemistry profile will be obtained to follow  her thrombocytopenia, mild anemia, and electrolytes in the setting of  diuretic therapy.  Vaccinations are up to date.  I will plan to see this  nice woman again in 6 months.    Sincerely,      Gerrit Friends. Dietrich Pates, MD, [facility]  Electronically Signed    RMR/MedQ  DD: 05/14/2006  DT:  05/14/2006  Job #: [id number]

## 2010-09-05 NOTE — H&P (Signed)
[name], [name]               ACCOUNT NO.:  [id number]   MEDICAL RECORD NO.:  [id number]          PATIENT TYPE:  INP   LOCATION:  1827                         FACILITY:  [facility]   PHYSICIAN:  Bunk Foss Bing, M.D. [facility] BIRTH:  18-Mar-1922   DATE OF ADMISSION:  06/02/2005  DATE OF DISCHARGE:                                HISTORY & PHYSICAL   PRIMARY CARE PHYSICIAN:  Patrica Duel, M.D.   PRIMARY CARDIOLOGIST:  [place] Bing, M.D.   PATIENT PROFILE:  An 77 year old African-American female with prior history  of CAD who presents with left shoulder and scapula discomfort.   PROBLEM LIST:  1.  CAD.      1.  November 19, 2003, PCI and stenting of the LAD with a 2.5 x 20 mm Taxus          drug-eluting stent proximally and PTCA of the distal LAD. She has a          residual 95% diffuse stenosis in the RCA.      2.  February 17, 2005, pharmacologic Myoview distal inferolateral and          apical infarcts. No evidence of ischemia.  2.  History of ischemic cardiomyopathy.      1.  Echocardiogram in August 2006 showed an EF greater than 75%.  3.  Hypertension.  4.  Hyperlipidemia.  5.  Anemia.  6.  History of junctional rhythm.  7.  Thrombocytopenia.   HISTORY OF PRESENT ILLNESS:  A 76 year old African-American female with  history of CAD, status post PCI of the LAD in 2005 with residual RCA disease  and negative functional study in the fall of 2006. Sometime this afternoon,  she developed pretty bad left shoulder pain moving into the left scapula  area without associated symptoms, relieved with sublingual nitroglycerin.  Per patient's family, she has been having similar pains occurring at rest  for approximately three weeks requiring increased nitroglycerin usage. The  patient presented to [facility] secondary to recurrence in shoulder  and scapula discomfort and was initiated on IV nitroglycerin which was  eventually titrated to 30 mcg per minute with complete  resolution of pain.  Of note, her EKG showed minimal inferior ST segment elevation with T-wave  inversions in V2 and V3. This was changed from previous. She also had  elevation of her CK-MB at 7.8 with a normal CK at 76 and a troponin I at  0.05. She was transferred to [facility] for further evaluation. She  is currently painfree.   ALLERGIES:  No known drug allergies.   MEDICATIONS:  1.  Aspirin 81 milligrams daily.  2.  Lipitor 20 milligrams daily.  3.  Spironolactone 25 milligrams b.i.d.  4.  Lisinopril 20 milligrams daily.  5.  HCTZ 25 milligrams daily.  6.  Nitroglycerin 0.4 milligrams sublingual p.r.n. chest pain.   FAMILY HISTORY:  She is fairly vague about this and does not really know to  much about it, although her father had a stroke.   SOCIAL HISTORY:  She lives in [place] with a daughter and she is  retired. She  has never smoked or used alcohol.   REVIEW OF SYSTEMS:  Positive for chest pain and constipation. Other systems  reviewed and negative.   PHYSICAL EXAMINATION:  VITAL SIGNS: She is afebrile. Heart rate 62,  respirations 20, blood pressure 106/62, pulse oximeter 100% on two liters.  GENERAL: A pleasant African-American female in no acute distress. Awake,  alert and oriented x3.  NECK: Normal carotid upstrokes. No bruits or JVD.  LUNGS: Sounds are symmetric and unlabored to auscultation.  CARDIOVASCULAR: Regular S1 and S2. No S3, S4, murmurs.  ABDOMEN: Round, soft, nontender and nondistended. Bowel sounds are present.  EXTREMITIES: Warm and dry without clubbing, cyanosis, or edema. Dorsalis  pedis and posterior tibial pulses 1+ and equal bilaterally.   Chest x-ray shows no acute disease. EKG shows sinus arrhythmia with normal  axis and 0.5 and 1 mm inferior ST segment elevation with T-wave inversion in  V2 through V3.   LAB WORK:  Hemoglobin 14.1, hematocrit 41.3, WBC 4.3, platelets 81,000.  Sodium 134, potassium 4.5, chloride 98, CO2 25, BUN 24,  creatinine 1.2,  glucose 120. Total bilirubin 0.4, alkaline phosphate 99, AST 37, ALT 39,  lipase 39, total protein 7.3, albumin 4.2. PT 13.2, INR 1.0, CK 76, MB 7.8,  troponin I 0.05. UA was negative.   ASSESSMENT/PLAN:  1.  Left shoulder/scapular pain/coronary artery disease: Question non-ST      elevation myocardial infarction. CK-MB is elevated with mild elevation      of troponin and normal CK. We will continue to cycle cardiac markers.      She is currently painfree on intravenous nitroglycerin and heparin. We      will continue add some Statin and ACE inhibitor. No beta blocker      secondary to history of junctional rhythm. Based on her clinical      scenario along with repeated cardiac markers, we will make a decision in      the a.m. regarding further evaluation, functional study versus      catheterization.  2.  Hypertension: Stable.  3.  Hyperlipidemia: Check lipids and continue Statin.  4.  Some cardiomyopathy: Last ejection fraction in August 2006 was greater      than 75%. She is euvolemic on exam. Continue ACE inhibitor and      spironolactone.      Ok Anis, NP      Mercer Bing, M.D. [facility]  Electronically Signed   CRB/MEDQ  D:  06/02/2005  T:  06/03/2005  Job:  [id number]

## 2010-09-05 NOTE — Consult Note (Signed)
[name], [name]                         ACCOUNT NO.:  [id number]   MEDICAL RECORD NO.:  [id number]                   PATIENT TYPE:  INP   LOCATION:  2002                                 FACILITY:  [facility]   PHYSICIAN:  Mikey Bussing, M.D.           DATE OF BIRTH:  [birth year]   DATE OF CONSULTATION:  11/15/2003  DATE OF DISCHARGE:                                   CONSULTATION   PRIMARY CARE PHYSICIAN:  Patrica [place]uel[place]. [place].   REASON FOR CONSULTATION:  Two vessel coronary artery disease and  subendocardial MI.   CHIEF COMPLAINT:  Shoulder pain and shortness of breath.   HISTORY OF PRESENT ILLNESS:  I was asked to evaluate this 77 year old black  female for potential surgical coronary revascularization for recently  diagnosed severe two vessel coronary artery disease.  She is admitted to the  hospital on November 12, 2003 with shoulder pain and shortness of breath.  She  was found to be in SVT and was converted to sinus rhythm with IV Cardizem.  Chest x-ray showed mild CHF with mediastinal fullness, possible right hilar  mass.  Cardiac enzymes were checked and were elevated with a CPK-MB of 33 ng  per mL.  She was admitted and placed on aspirin, heparin, and continued  therapy for her cardiac arrhythmia.   A 2-D echocardiogram was performed which showed ejection fraction of 45-50%  with apical anterior hypokinesia and dyskinesia of the apex.  There is a  question of an anterior hypokinesis and dyskinesia of the apex.  There is a  question of a laminated LV thrombus.  She had no significant aortic or  mitral valvular insufficiency.  She underwent coronary arteriography today  which demonstrated 95% stenosis of the LAD/diagonal, 95% stenosis of the  right coronary with no significant disease of the circumflex.  There was no  left ventriculogram performed due to her LV thrombus.  Because of her  coronary disease, a surgical evaluation was  requested.   The patient denies prior MI.  She apparently had a history of idiopathic  cardiomyopathy in the past but apparently was not catheterized.  Her  ejection fraction at one point was 2.5.  She apparently recovered from that  and has been doing reasonably well.  She lives by herself and has a husband in a nursing home.  She presented to  the emergency room with her chest pain.   PAST MEDICAL HISTORY:  1. Hypertension.  2. Elevated blood glucoses.  3. Anemia of unknown etiology.   ALLERGIES:  No known drug allergies.   MEDICATIONS:  1. Paxil 10 mg q.d.  2. Lasix 40 mg p.o. q.d.  3. Lisinopril 10 mg q.d.  4. Toprol XL 25 mg q.d.  5. Aspirin 1 p.o. q.d.   SOCIAL HISTORY:  She lives in [place], [place] and is by her daughter.  She does not use alcohol or  tobacco.   FAMILY HISTORY:  Positive for CVA, positive for diabetes.   REVIEW OF SYMPTOMS:  Positive for a history of cardiomyopathy, now unstable  angina with subendocardial MI.  She denies any previous surgery, previous  thoracic trauma, symptoms of peripheral vascular disease, DVT, or history of  stroke.   PHYSICAL EXAMINATION:  VITAL SIGNS:  Blood pressure is 140/80, heart rate is  70 and regular, sinus rhythm.  GENERAL:  Elderly, frail, thin black female in her hospital room following  cardiac catheterization in no distress. She answers questions fairly slowly  but does indicate understanding.  HEENT:  Full dental plates upper and lower.  EOMs full.  NECK:  Without JVD or mass.  There is no carotid bruits.  LYMPH NODES:  No palpable supraclavicular or axillary adenopathy.  CHEST:  Without deformity with clear breath sounds.  CARDIOVASCULAR:  Regular rhythm without S3, gallop, murmur, or rub.  ABDOMEN:  Soft and nontender without mass.  EXTREMITIES:  No clubbing, edema, or cyanosis.  RECTAL:  Deferred.  PULSES:  Peripheral pulses are 2+ in the upper extremities and femoral.  Nonpalpable pedal pulses.   NEUROLOGICAL:  No focal abnormality.   LABORATORY DATA:  Blood glucose is 167.  Her hemoglobin is 11 gm.  Her BUN  and creatinine is 16 and 1.2.   Her vascular labs today show no significant carotid occlusive disease.  ABI  of 1.0 in each lower extremity and brachial artery pressures equal  bilaterally.   IMPRESSION:  The patient has significant coronary disease with recent  myocardial infarction.  She does not appear to be a surgical candidate;  however, based on her comorbid disease, age and frail status, we will  proceed with getting a CT scan of the chest as recommended by the  radiologist to evaluate her mediastinum and check a hemoglobin A1C and  creatinine tomorrow.  I discussed the situation with her daughter and they  are very concern about this woman having surgery and ending up in a nursing  home the rest of her life which is a significant possibility.  I will follow  up with the patient again in 24 hours and review the situation but at this  point medical therapy appears to be the best option.                                               Mikey Bussing, M.D.    PV/MEDQ  D:  11/15/2003  T:  11/15/2003  Job:  [id number]

## 2010-11-24 ENCOUNTER — Emergency Department (HOSPITAL_COMMUNITY)
Admission: EM | Admit: 2010-11-24 | Discharge: 2010-11-24 | Disposition: A | Discharge status: Home / Self Care | Payer: Medicare Other | Attending: Emergency Medicine | Admitting: Emergency Medicine

## 2010-11-24 ENCOUNTER — Encounter: Payer: Self-pay | Admitting: *Deleted

## 2010-11-24 DIAGNOSIS — F0390 Unspecified dementia without behavioral disturbance: Secondary | ICD-10-CM | POA: Insufficient documentation

## 2010-11-24 DIAGNOSIS — Z8679 Personal history of other diseases of the circulatory system: Secondary | ICD-10-CM | POA: Insufficient documentation

## 2010-11-24 DIAGNOSIS — K219 Gastro-esophageal reflux disease without esophagitis: Secondary | ICD-10-CM | POA: Insufficient documentation

## 2010-11-24 DIAGNOSIS — I509 Heart failure, unspecified: Secondary | ICD-10-CM | POA: Insufficient documentation

## 2010-11-24 DIAGNOSIS — N189 Chronic kidney disease, unspecified: Secondary | ICD-10-CM | POA: Insufficient documentation

## 2010-11-24 DIAGNOSIS — E119 Type 2 diabetes mellitus without complications: Secondary | ICD-10-CM | POA: Insufficient documentation

## 2010-11-24 DIAGNOSIS — R040 Epistaxis: Secondary | ICD-10-CM | POA: Insufficient documentation

## 2010-11-24 DIAGNOSIS — E785 Hyperlipidemia, unspecified: Secondary | ICD-10-CM | POA: Insufficient documentation

## 2010-11-24 DIAGNOSIS — I2510 Atherosclerotic heart disease of native coronary artery without angina pectoris: Secondary | ICD-10-CM | POA: Insufficient documentation

## 2010-11-24 DIAGNOSIS — Z79899 Other long term (current) drug therapy: Secondary | ICD-10-CM | POA: Insufficient documentation

## 2010-11-24 HISTORY — DX: Atherosclerotic heart disease of native coronary artery without angina pectoris: I25.10

## 2010-11-24 HISTORY — DX: Gastro-esophageal reflux disease without esophagitis: K21.9

## 2010-11-24 HISTORY — DX: Ventricular tachycardia: I47.2

## 2010-11-24 HISTORY — DX: Unspecified atrial flutter: I48.92

## 2010-11-24 HISTORY — DX: Heart failure, unspecified: I50.9

## 2010-11-24 HISTORY — DX: Thrombocytopenia, unspecified: D69.6

## 2010-11-24 HISTORY — DX: Hyperlipidemia, unspecified: E78.5

## 2010-11-24 HISTORY — DX: Sick sinus syndrome: I49.5

## 2010-11-24 HISTORY — DX: Transient cerebral ischemic attack, unspecified: G45.9

## 2010-11-24 HISTORY — DX: Unspecified right bundle-branch block: I45.10

## 2010-11-24 HISTORY — DX: Unspecified dementia, unspecified severity, without behavioral disturbance, psychotic disturbance, mood disturbance, and anxiety: F03.90

## 2010-11-24 HISTORY — DX: Chronic kidney disease, unspecified: N18.9

## 2010-11-24 HISTORY — DX: Ventricular tachycardia, unspecified: I47.20

## 2010-11-24 MED ORDER — OXYMETAZOLINE HCL 0.05 % NA SOLN
NASAL | Status: AC
  Administered 2010-11-24: 1 via NASAL
  Filled 2010-11-24: qty 15

## 2010-11-24 MED ORDER — OXYMETAZOLINE HCL 0.05 % NA SOLN
1.0000 | Freq: Once | NASAL | Status: AC
  Administered 2010-11-24: 1 via NASAL

## 2010-11-24 NOTE — ED Provider Notes (Signed)
History   Chart scribed for Laray Anger, DO by [facility] M; the patient was seen in room APA18/APA18; this patient's care was started at 7:26 AM.    CSN: [id number] Arrival date & time: 11/24/2010  6:47 AM  Chief Complaint  Patient presents with  . Epistaxis   The history is provided by a relative and a caregiver. History Limited By: hx dementia.   [name] is a 77 y.o. female who presents to the Emergency Department complaining of nosebleed. Per family, pt woke up at 5am with left nosebleed. Pt BIB EMS, bleeding improved en route and has resolved currently. Pt takes allegra. No recent cough, congestion, sneezing, or runny nose. No injury to cause nosebleed. Pt has no other complaints and is otherwise in her usual state of health.    Past Medical History  Diagnosis Date  . Coronary artery disease   . Ventricular tachycardia   . CHF (congestive heart failure)   . GERD (gastroesophageal reflux disease)   . Chronic kidney disease   . Atrial flutter   . Sick sinus syndrome   . Dementia   . Diabetes mellitus   . Thrombocytopenia   . Right bundle branch block   . Hyperlipidemia   . TIA (transient ischemic attack)     Past Surgical History  Procedure Date  . Coronary angioplasty with stent placement   . Biopsy thyroid     History reviewed. No pertinent family history.  History  Substance Use Topics  . Smoking status: Not on file  . Smokeless tobacco: Not on file  . Alcohol Use:     OB History    Grav Para Term Preterm Abortions TAB SAB Ect Mult Living                  Review of Systems  Unable to perform ROS: Dementia    Physical Exam  BP 128/54  Pulse 63  Temp(Src) 97.8 F (36.6 C) (Oral)  Resp 17  SpO2 100%  Physical Exam 0725:  Physical examination:  Nursing notes reviewed; Vital signs and O2 SAT reviewed;  Constitutional: Well developed, Well nourished, Well hydrated, In no acute distress; Head:  Normocephalic, atraumatic; Eyes: EOMI,  PERRL, No scleral icterus; ENMT: +dried blood left nares, no active bleeding.  Mouth and pharynx normal, Mucous membranes moist, no blood in post pharynx; Neck: Supple, Full range of motion, No lymphadenopathy; Cardiovascular: Regular rate and rhythm, No murmur, rub, or gallop; Respiratory: Breath sounds clear & equal bilaterally, No rales, rhonchi, wheezes, or rub, Normal respiratory effort/excursion; Chest: Nontender, Movement normal; Abdomen: Soft, Nontender, Nondistended, Normal bowel sounds; Genitourinary: No CVA tenderness; Extremities: Pulses normal, No tenderness, No edema, No calf edema or asymmetry.; Neuro: AA&Ox3, Major CN grossly intact.  No gross focal motor or sensory deficits in extremities.; Skin: Color normal, Warm, Dry.   ED Course  Procedures  [phone]:  +dried blood left nares.  +large clot removed from left nares.  No active area of bleeding noted.  Afrin sprayed bilat nares.  Will continue to observe.   MDM Reviewed: nursing note and vitals     9:30 AM:  Per pt's family, pt woke up and family noticed left nosebleed.  Pressure held with resolution, but bleeding began again, held pressure and called EMS.  EMS gave afrin with resolution of epistaxis.  Pt without further epistaxis while in ED after several hours of observation.  Wants to go home now.  Dx testing d/w pt and family.  Questions  answered.  Verb understanding, agreeable to d/c home with outpt f/u.    I personally performed the services described in this documentation, which was scribed in my presence. The recorded information has been reviewed and considered.   Ugo Thoma Allison Quarry, DO 11/24/10 2038

## 2010-11-24 NOTE — ED Notes (Signed)
Pt brought in by [facility] for c/o nosebleed; bleeding started at 5am but has since subsided

## 2010-11-24 NOTE — ED Notes (Signed)
Family at bedside calling for ride.  PT ate breakfast.

## 2010-11-24 NOTE — ED Notes (Signed)
Pt wanting to eat breakfast before leaving.  Ordered pt meal tray.

## 2010-11-24 NOTE — ED Notes (Signed)
Spoke with daughter regarding transportation home. Daughter states they have no ride home. Daughter also states "shes having back pain now". "She's sick. I don't think you should send her home sick". Daughter and pt instructed that pt has been evaluated by ED MD and pt has been cleared for discharge. Daughter also instructed pt has been lying in gurney for a while now and pts back pain could be caused by lying in gurney. Dr Clarene Duke notified and Dr Clarene Duke reinforced pts discharge clearance with pt and daughter. Spoke with Loni Dolly in re: to obtaining cab voucher. Verlon Au primary RN notified.

## 2010-11-24 NOTE — ED Notes (Signed)
Family unable to find a ride home.  Notified AD, Clint Bolder, and she is getting pt a cab.

## 2011-01-08 IMAGING — US US BIOPSY
1 series · 13 of 25 positions shown · non-contrast
Comparison: none

CLINICAL DATA: Two indeterminate dominant masses within thyroid
gland

ULTRASOUND GUIDED FINE NEEDLE ASPIRATION BIOPSIES OF TWO THYROID
NODULES:
TECHNIQUE: Written informed consent obtained after discussion of procedure and
risks.
Time-out protocol performed.
TWO thyroid masses were localized sonographically.
These include a 2.3 x 2.7 x 1.5 cm diameter heterogeneous solid
nodule in the isthmus and a 4.6 x 3.7 x 3.8 cm diameter
heterogeneous solid mass at the inferior pole of the right thyroid
lobe.

[Series 1: us biopsy · 0.06mm/px · 13 of 26 slices shown]
[im 1/26]
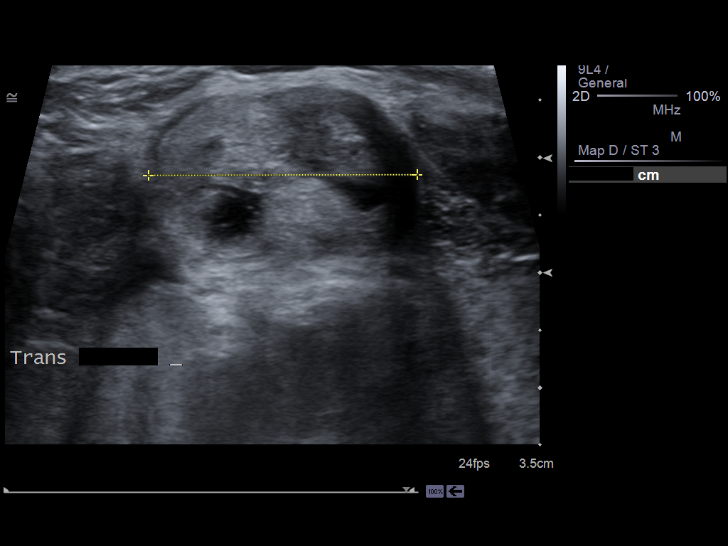
[im 3/26]
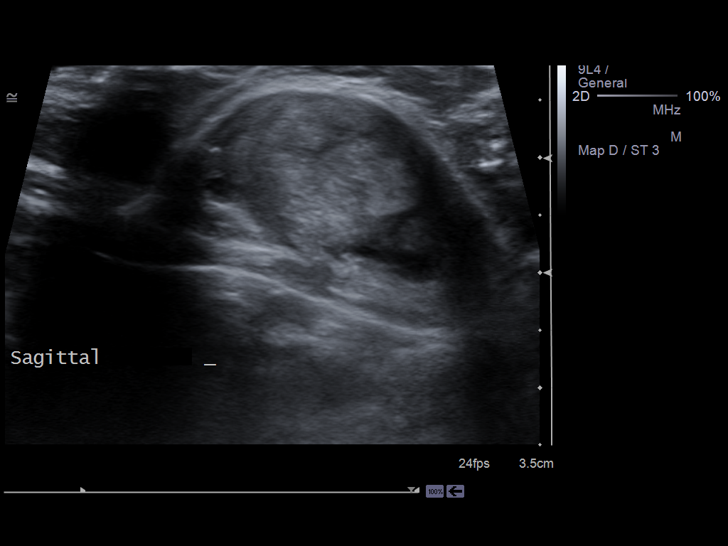
[im 5/26]
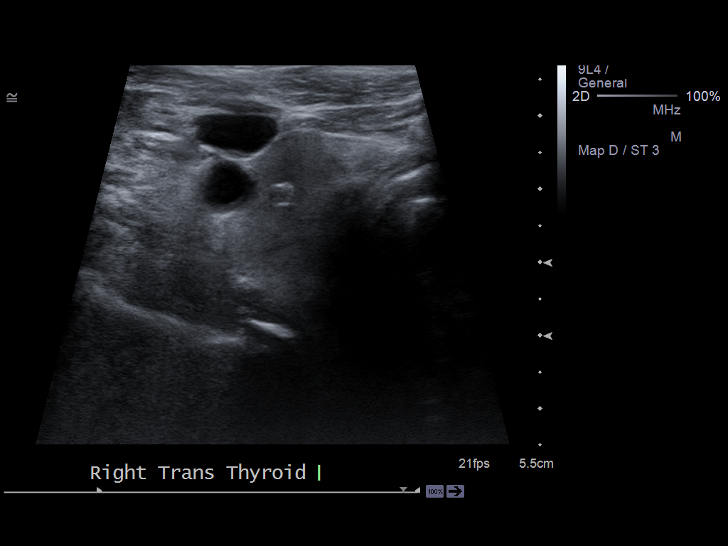
[im 7/26]
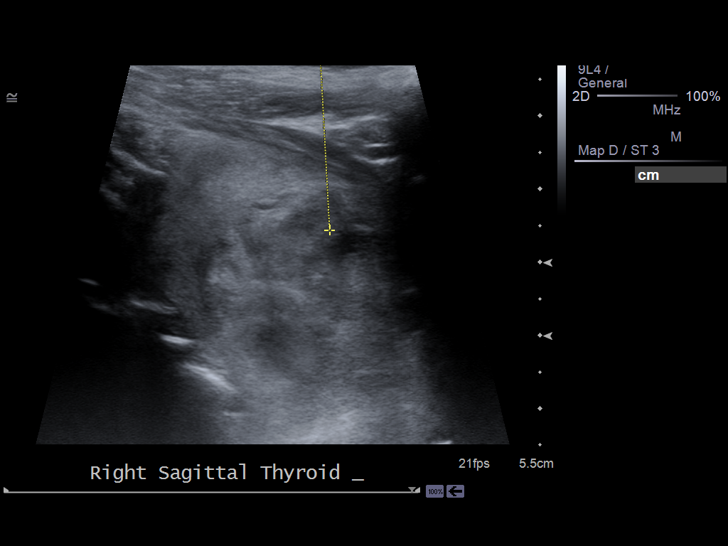
[im 9/26]
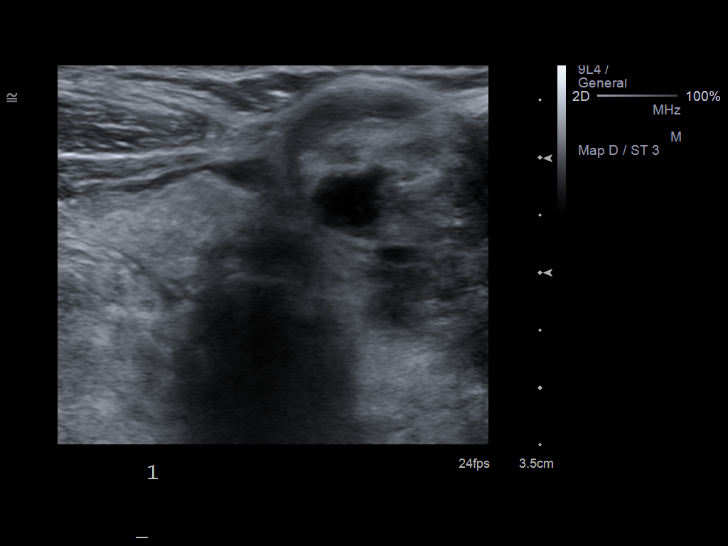
[im 11/26]
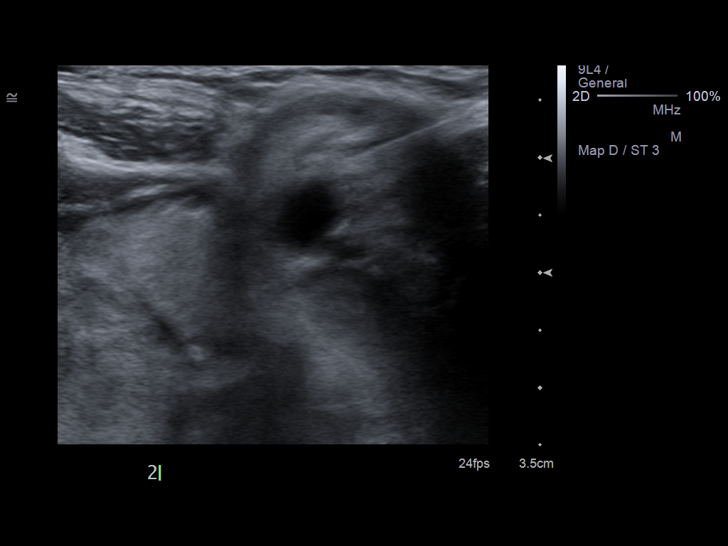
[im 13/26]
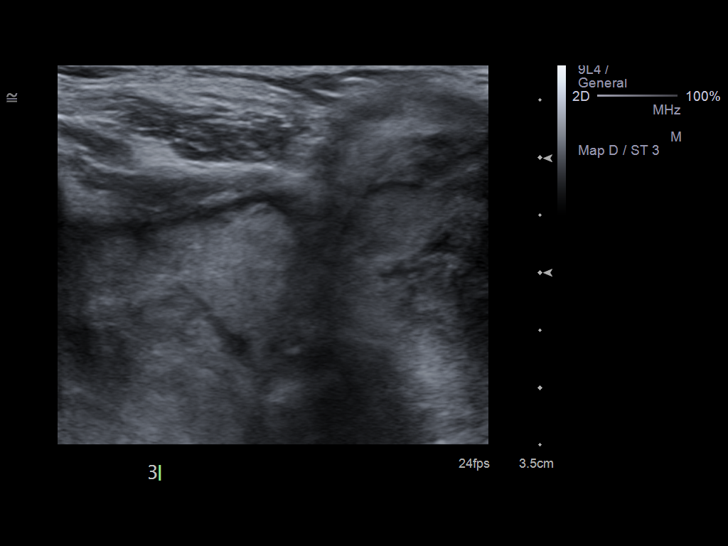
[im 15/26]
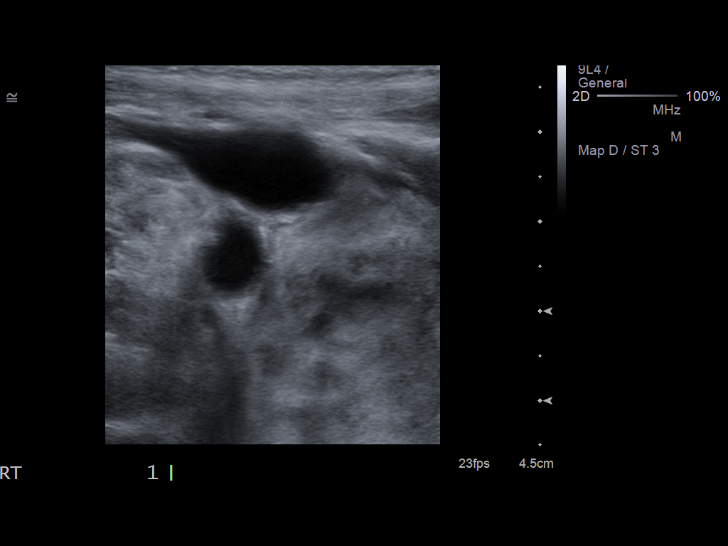
[im 17/26]
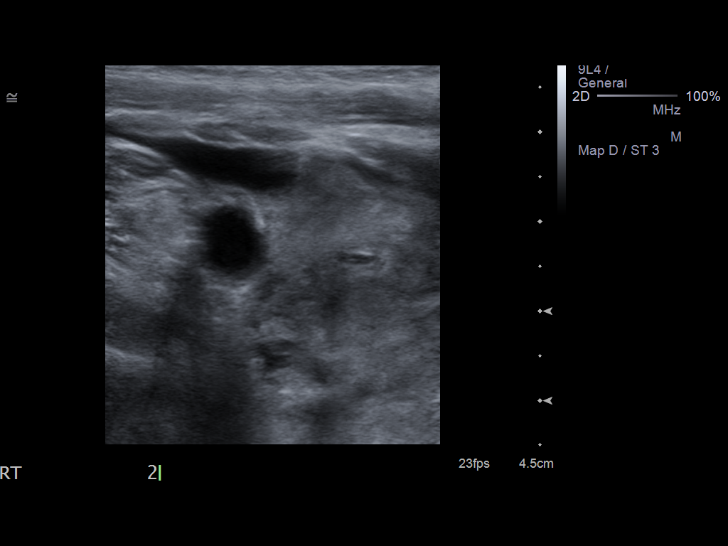
[im 19/26]
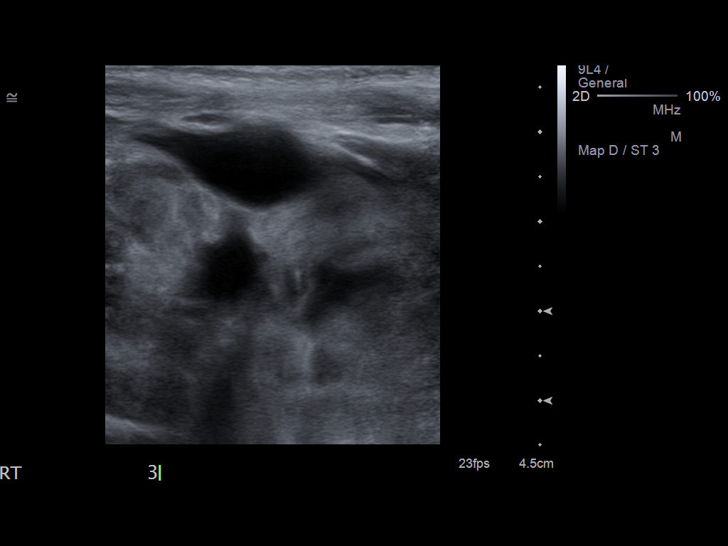
[im 21/26]
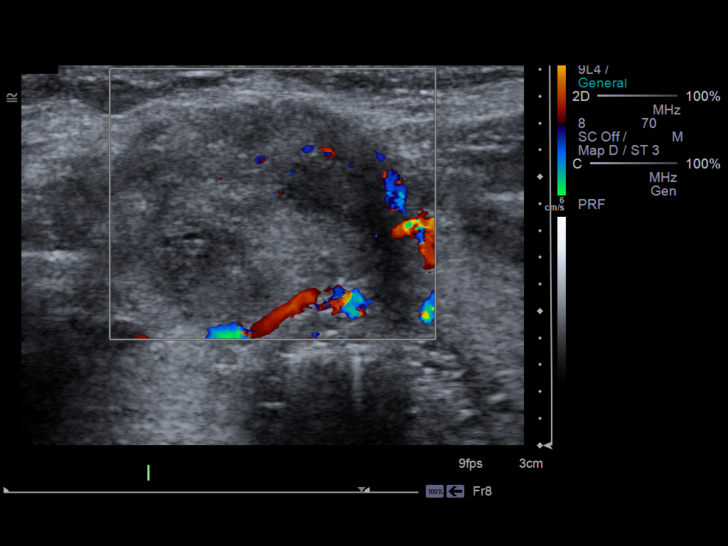
[im 23/26]
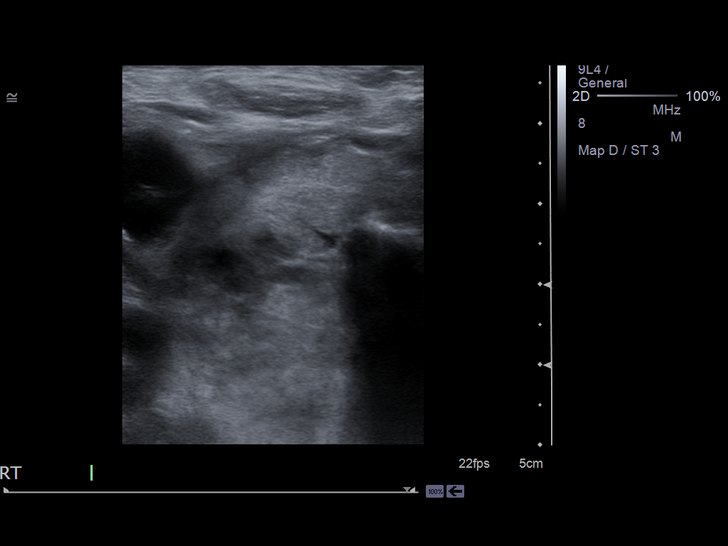
[im 26/26]
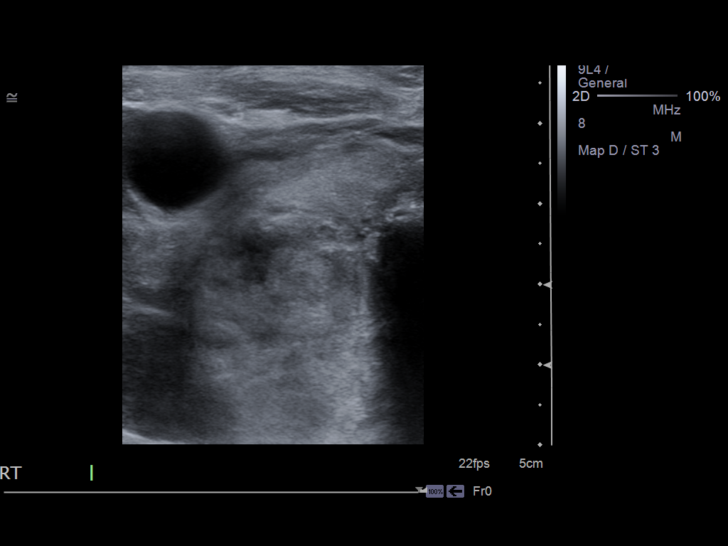

[13 of 25 positions shown; findings below may reference images not displayed]

Skin prepped and draped in usual sterile fashion.
Skin and soft tissues at two sites anesthestized with 2 ml of 1%
lidocaine.

Under sonographic guidance, three 25-gauge fine needle aspiration
biopsies of the isthmic thyroid nodule were performed.
Procedure tolerated well by patient without immediate complication.

Under sonographic guidance, three 25-gauge fine needle aspiration
biopsies of the right lobe thyroid mass were performed.
Procedure tolerated well by patient without immediate complication.

No evidence of hematoma on postprocedural images.
Standard post procedure instructions were given to patient and
discussed with the patient's daughter.
Specimens were sent to the laboratory for cytologic analysis.
IMPRESSION: Ultrasound guided fine needle aspiration biopsy of TWO thyroid
masses, located at the thyroid isthmus and at the lower pole of the
right lobe.

## 2011-01-14 ENCOUNTER — Encounter (HOSPITAL_COMMUNITY): Payer: Medicare Other | Attending: Oncology | Admitting: Oncology

## 2011-01-14 ENCOUNTER — Encounter (HOSPITAL_COMMUNITY): Payer: Self-pay | Admitting: Oncology

## 2011-01-14 VITALS — BP 106/63 | HR 48 | Temp 97.3°F

## 2011-01-14 DIAGNOSIS — D696 Thrombocytopenia, unspecified: Secondary | ICD-10-CM

## 2011-01-14 DIAGNOSIS — R300 Dysuria: Secondary | ICD-10-CM | POA: Insufficient documentation

## 2011-01-14 DIAGNOSIS — D693 Immune thrombocytopenic purpura: Secondary | ICD-10-CM | POA: Insufficient documentation

## 2011-01-14 DIAGNOSIS — I10 Essential (primary) hypertension: Secondary | ICD-10-CM

## 2011-01-14 LAB — CBC
Hemoglobin: 12.7 g/dL (ref 12.0–15.0)
MCHC: 32.3 g/dL (ref 30.0–36.0)
Platelets: 74 10*3/uL — ABNORMAL LOW (ref 150–400)
RBC: 4.51 MIL/uL (ref 3.87–5.11)

## 2011-01-14 LAB — URINALYSIS, ROUTINE W REFLEX MICROSCOPIC
Glucose, UA: NEGATIVE mg/dL
Nitrite: NEGATIVE
Protein, ur: NEGATIVE mg/dL
Urobilinogen, UA: 0.2 mg/dL (ref 0.0–1.0)

## 2011-01-14 LAB — URINE MICROSCOPIC-ADD ON

## 2011-01-14 NOTE — Progress Notes (Signed)
Addended byMarica Otter J on: 01/14/2011 12:30 PM   Modules accepted: Orders

## 2011-01-14 NOTE — Progress Notes (Signed)
Addended byLeida Lauth on: 01/14/2011 12:44 PM   Modules accepted: Orders

## 2011-01-14 NOTE — Progress Notes (Signed)
This office note has been dictated.

## 2011-01-14 NOTE — Progress Notes (Signed)
CC:   [name], M.D. [name], MD  DIAGNOSES: 1. Chronic low grade idiopathic thrombocytopenia purpura still not in     need of therapy. 2. Myocardial infarction in September 2008 as well as January 2012     treated at [facility]. 3. Hypercholesterolemia. 4. Hypertension. 5. Right lower leg phlebitis in the past. 6. Right knee arthritis with difficulty walking. 7. Difficulty hearing, but that is stable. 8. Herpes zoster in the past. 9. Sick sinus syndrome with bradycardia.  Pulse rate today is 60. 10.Myocardial infarction in 2005 July status post stent placement at     that time. 11.History of congestive heart failure with ischemic cardiomyopathy. 12.Thyroid disease.  [name] is here today with her daughter who lives with her.  She still has heart issues for which she has seen Dr. Daphene Jaeger.  She also came to the emergency room about a month or two ago according to her daughter with a nosebleed out of the left side.  They just gave her some Afrin nasal spray and she was allowed to go home.  Her pulse when the nurse took it was 48.  I got 60 on two separate occasions.  One was done peripherally.  The other was done listening to her precordium.  She has on exam a blood pressure of 106/63 left arm sitting position, the pulse was 60 and regular, respirations 16-18 and unlabored, temperature is normal.  Her lungs showed rales at the right base much more so than the left.  She has just a few small ones on the left.  The ones on the right cleared partially with coughing.  She has clear lung fields otherwise.  The heart again showed a regular rhythm and rate without distinct S3 gallop.  She had no breast masses.  No adenopathy in the cervical or supraclavicular areas.  She had a soft abdomen.  She had no petechiae on her legs.  Her left nostril did have a little irritated area that is slightly eroded covered by a small scab.  There is ever so slight irritation on the right, both  medially located.  I think with her low platelets, aspirin, etc. she is probably more susceptible to a nose bleed, but she can put a little Vaseline in this area if she needs to.  Her platelets will check today.  She also complains of some burning on urination for the last month.  We will check a urinalysis on her today as well.  We will see her back in 6 months one way or the other.  She states that she would like [place] to keep her going as long as we can.  She did make that little statement and we will try to do our best.    ______________________________ Ladona Horns. Mariel Sleet, MD ESN/MEDQ  D:  01/14/2011  T:  01/14/2011  Job:  [id number]

## 2011-01-14 NOTE — Patient Instructions (Signed)
[facility]  Discharge Instructions  RECOMMENDATIONS MADE BY THE CONSULTANT AND ANY TEST RESULTS WILL BE SENT TO YOUR REFERRING DOCTOR.   EXAM FINDINGS BY MD TODAY AND SIGNS AND SYMPTOMS TO REPORT TO CLINIC OR PRIMARY MD: Will check some labs today and try to get a urine sample.  MEDICATIONS PRESCRIBED: none    SPECIAL INSTRUCTIONS/FOLLOW-UP: Return to Clinic in 1 year.   I acknowledge that I have been informed and understand all the instructions given to me and received a copy. I do not have any more questions at this time, but understand that I may call the Specialty Clinic at [facility] at [phone] during business hours should I have any further questions or need assistance in obtaining follow-up care.    __________________________________________  _____________  __________ Signature of Patient or Authorized Representative            Date                   Time    __________________________________________ Nurse's Signature

## 2011-01-14 NOTE — Progress Notes (Signed)
Addended by: Dennie Maizes on: 01/14/2011 01:29 PM   Modules accepted: Orders

## 2011-01-14 NOTE — Progress Notes (Signed)
Labs drawn today for cbc 

## 2011-01-15 ENCOUNTER — Other Ambulatory Visit (HOSPITAL_COMMUNITY): Payer: Self-pay | Admitting: *Deleted

## 2011-01-15 ENCOUNTER — Telehealth (HOSPITAL_COMMUNITY): Payer: Self-pay | Admitting: *Deleted

## 2011-01-15 DIAGNOSIS — N390 Urinary tract infection, site not specified: Secondary | ICD-10-CM

## 2011-01-15 NOTE — Telephone Encounter (Signed)
Septra DS called in to [facility] for patient. Patient notified of drug being called in and followup appt on OCT 30th for urinalysis. Pt verbalized understanding.

## 2011-01-28 LAB — DIFFERENTIAL
Basophils Absolute: 0
Basophils Relative: 0
Eosinophils Absolute: 0.1
Eosinophils Relative: 2
Neutrophils Relative %: 67

## 2011-01-28 LAB — CBC
MCHC: 33.7
MCV: 86.1
RDW: 14.6 — ABNORMAL HIGH
WBC: 5

## 2011-01-29 LAB — BASIC METABOLIC PANEL
BUN: 29 — ABNORMAL HIGH
CO2: 29
Calcium: 9.3
Calcium: 9.3
Chloride: 105
Chloride: 105
Creatinine, Ser: 0.9
Creatinine, Ser: 0.99
GFR calc Af Amer: >60
GFR calc Af Amer: >60
GFR calc Af Amer: >60
GFR calc non Af Amer: 54 — ABNORMAL LOW
GFR calc non Af Amer: 59 — ABNORMAL LOW
Glucose, Bld: 89
Potassium: 3.8
Potassium: 4.4
Potassium: 4.5
Sodium: 142

## 2011-01-29 LAB — B-NATRIURETIC PEPTIDE (CONVERTED LAB)
Pro B Natriuretic peptide (BNP): 408 — ABNORMAL HIGH
Pro B Natriuretic peptide (BNP): 546 — ABNORMAL HIGH

## 2011-01-29 LAB — CBC
HCT: 34.3 — ABNORMAL LOW
HCT: 35.3 — ABNORMAL LOW
Hemoglobin: 11.3 — ABNORMAL LOW
Hemoglobin: 12
MCV: 86.6
MCV: 87.3
Platelets: 37 — CL
Platelets: 38 — CL
Platelets: 44 — CL
RBC: 3.68 — ABNORMAL LOW
RBC: 3.89
RBC: 3.93
WBC: 3.2 — ABNORMAL LOW
WBC: 3.5 — ABNORMAL LOW
WBC: 4.4
WBC: 4.5

## 2011-01-29 LAB — HEPARIN ANTIBODY SCREEN: Heparin Antibody Screen: NEGATIVE

## 2011-01-29 LAB — CK TOTAL AND CKMB (NOT AT ARMC)
CK, MB: 2.9
Total CK: 52

## 2011-01-29 LAB — PROTIME-INR: Prothrombin Time: 13.2

## 2011-01-30 LAB — POCT CARDIAC MARKERS
CKMB, poc: 11.6
Myoglobin, poc: 132
Operator id: 282261
Operator id: 282261
Troponin i, poc: 0.67
Troponin i, poc: 0.98

## 2011-01-30 LAB — URINALYSIS, ROUTINE W REFLEX MICROSCOPIC
Hgb urine dipstick: NEGATIVE
Ketones, ur: 15 — AB
Protein, ur: NEGATIVE
Urobilinogen, UA: 0.2

## 2011-01-30 LAB — CBC
HCT: 32.3 — ABNORMAL LOW
HCT: 36.9
Hemoglobin: 10.7 — ABNORMAL LOW
MCHC: 33.5
MCV: 86.4
MCV: 87.5
Platelets: 50 — ABNORMAL LOW
Platelets: 50 — ABNORMAL LOW
RDW: 14.1 — ABNORMAL HIGH
RDW: 14.4 — ABNORMAL HIGH

## 2011-01-30 LAB — APTT: aPTT: 38 — ABNORMAL HIGH

## 2011-01-30 LAB — PROTIME-INR
INR: 1.1
Prothrombin Time: 12.8
Prothrombin Time: 14.6

## 2011-01-30 LAB — CARDIAC PANEL(CRET KIN+CKTOT+MB+TROPI)
CK, MB: 23.7 — ABNORMAL HIGH
Relative Index: 18.7 — ABNORMAL HIGH
Total CK: 127
Troponin I: 1.96

## 2011-01-30 LAB — DIFFERENTIAL
Basophils Absolute: 0.1
Lymphs Abs: 0.8
Monocytes Absolute: 0.2
Neutro Abs: 3.7
Smear Review: DECREASED

## 2011-01-30 LAB — LIPID PANEL
Cholesterol: 94
HDL: 43
LDL Cholesterol: 41
Total CHOL/HDL Ratio: 2.2

## 2011-01-30 LAB — BASIC METABOLIC PANEL
BUN: 15
BUN: 18
BUN: 18
CO2: 26
CO2: 28
Chloride: 107
Chloride: 113 — ABNORMAL HIGH
Creatinine, Ser: 1.03
GFR calc non Af Amer: 55 — ABNORMAL LOW
Glucose, Bld: 150 — ABNORMAL HIGH
Glucose, Bld: 216 — ABNORMAL HIGH
Glucose, Bld: 91
Potassium: 4
Potassium: 4.2
Sodium: 140

## 2011-02-17 ENCOUNTER — Encounter (HOSPITAL_COMMUNITY): Payer: Medicare Other | Attending: Oncology

## 2011-02-17 DIAGNOSIS — D693 Immune thrombocytopenic purpura: Secondary | ICD-10-CM | POA: Insufficient documentation

## 2011-02-17 DIAGNOSIS — R300 Dysuria: Secondary | ICD-10-CM | POA: Insufficient documentation

## 2011-02-17 DIAGNOSIS — N390 Urinary tract infection, site not specified: Secondary | ICD-10-CM

## 2011-02-17 LAB — URINALYSIS, ROUTINE W REFLEX MICROSCOPIC
Bilirubin Urine: NEGATIVE
Hgb urine dipstick: NEGATIVE
Ketones, ur: NEGATIVE mg/dL
Nitrite: NEGATIVE
Urobilinogen, UA: 0.2 mg/dL (ref 0.0–1.0)
pH: 5.5 (ref 5.0–8.0)

## 2011-02-17 NOTE — Progress Notes (Signed)
Urine specimen collected today 

## 2011-03-04 ENCOUNTER — Encounter (HOSPITAL_COMMUNITY): Payer: Medicare Other | Attending: Oncology

## 2011-03-04 DIAGNOSIS — Z23 Encounter for immunization: Secondary | ICD-10-CM

## 2011-03-04 DIAGNOSIS — D649 Anemia, unspecified: Secondary | ICD-10-CM | POA: Insufficient documentation

## 2011-03-04 MED ORDER — INFLUENZA VIRUS VACC SPLIT PF IM SUSP
0.5000 mL | INTRAMUSCULAR | Status: AC
  Administered 2011-03-04: 0.5 mL via INTRAMUSCULAR

## 2011-03-04 MED ORDER — INFLUENZA VIRUS VACC SPLIT PF IM SUSP
INTRAMUSCULAR | Status: AC
  Administered 2011-03-04: 0.5 mL via INTRAMUSCULAR
  Filled 2011-03-04: qty 0.5

## 2011-03-04 NOTE — Progress Notes (Signed)
Tolerated flu vaccine well 

## 2011-06-09 ENCOUNTER — Ambulatory Visit (HOSPITAL_COMMUNITY)
Admission: RE | Admit: 2011-06-09 | Discharge: 2011-06-09 | Disposition: A | Discharge status: Home / Self Care | Payer: Medicare Other | Source: Ambulatory Visit | Attending: Internal Medicine | Admitting: Internal Medicine

## 2011-06-09 ENCOUNTER — Other Ambulatory Visit (HOSPITAL_COMMUNITY): Payer: Self-pay | Admitting: Internal Medicine

## 2011-06-09 DIAGNOSIS — R059 Cough, unspecified: Secondary | ICD-10-CM | POA: Insufficient documentation

## 2011-06-09 DIAGNOSIS — R05 Cough: Secondary | ICD-10-CM

## 2011-06-09 IMAGING — CR DG CHEST 1V PORT
1 series · 1 of 1 positions shown · non-contrast
Comparison: 12/25/2009

CLINICAL DATA: weakness

PORTABLE CHEST - 1 VIEW

[view not recorded]
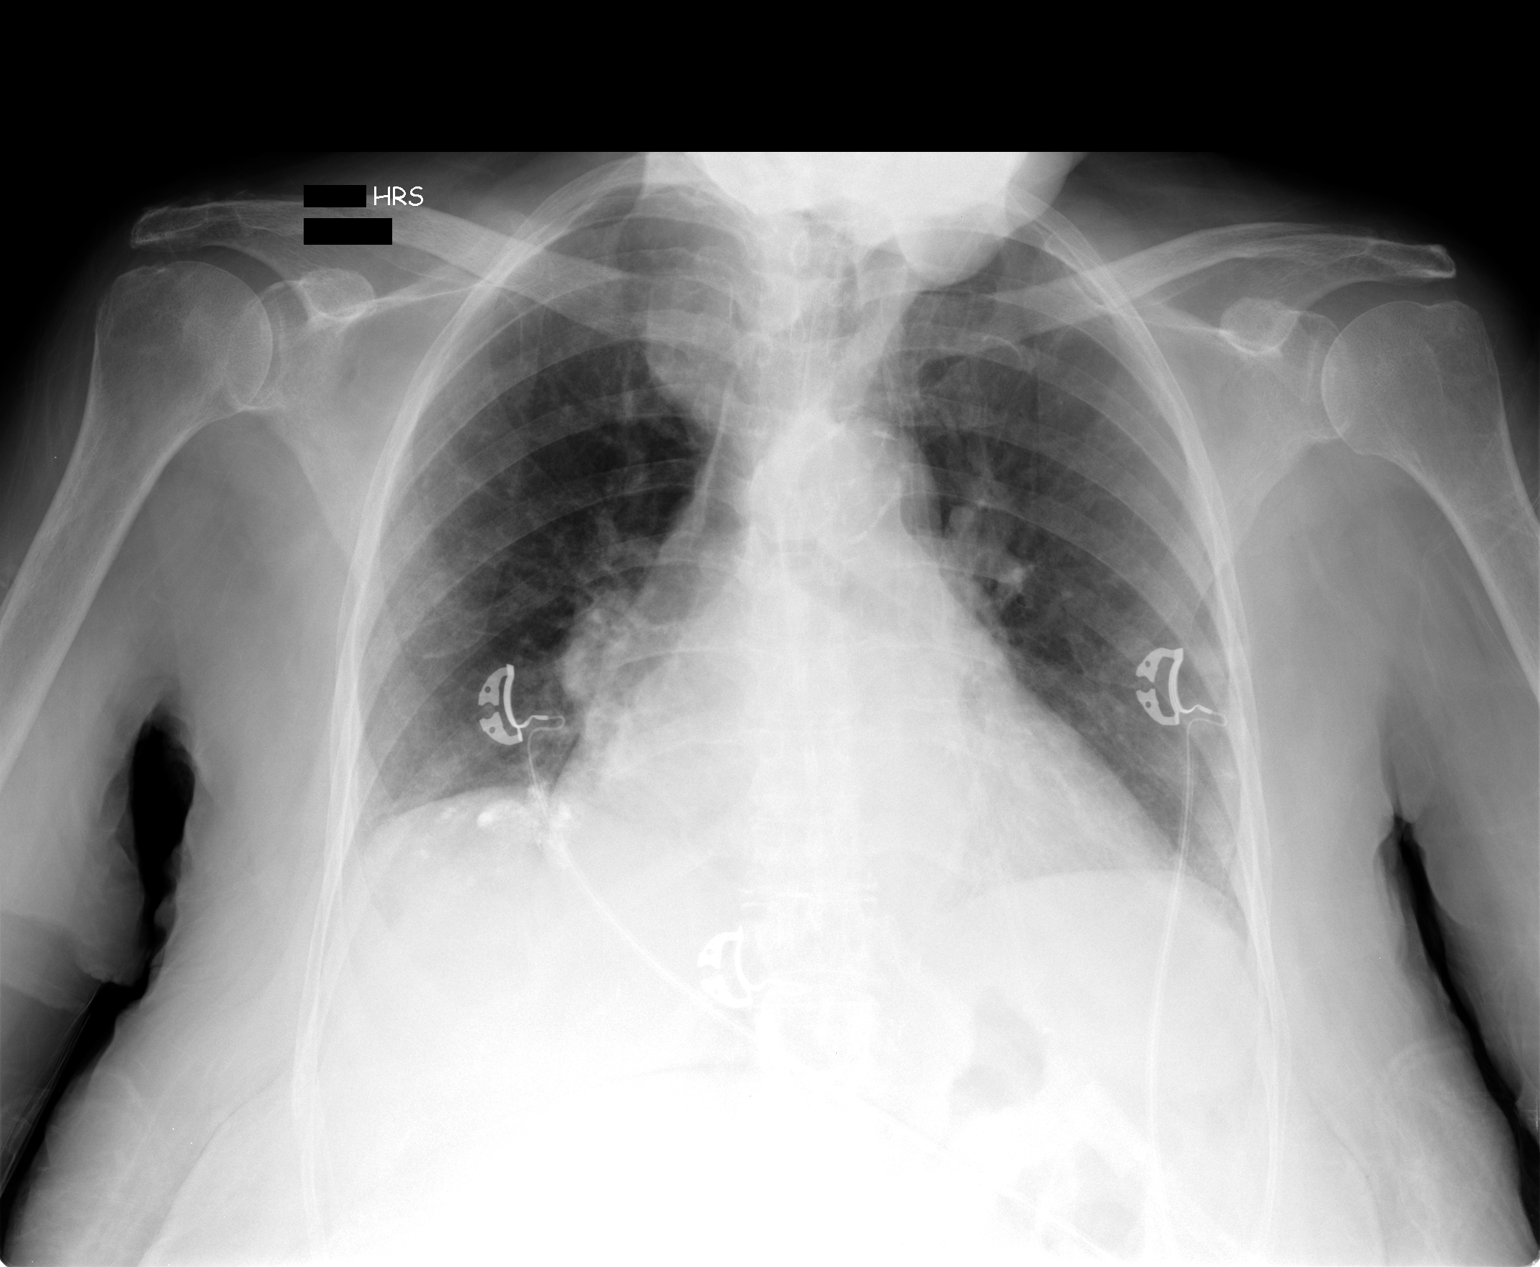

[1 of 1 positions shown; findings below may reference images not displayed]

FINDINGS: Cardiac enlargement is stable from prior exam.

There is no pleural effusion or pulmonary edema.

The lung volumes are low.

No airspace consolidation.

Aspirated barium is identified within the right lower lobe.

Enlarged right lobe of thyroid gland causes mass effect upon the
trachea.  This is similar to previous study.
IMPRESSION: 1.  No acute cardiopulmonary abnormalities.
2.  Stable cardiac enlargement.

## 2011-07-14 ENCOUNTER — Other Ambulatory Visit (HOSPITAL_COMMUNITY): Payer: Self-pay | Admitting: Oncology

## 2011-07-14 ENCOUNTER — Encounter (HOSPITAL_COMMUNITY): Payer: Medicare Other | Attending: Oncology | Admitting: Oncology

## 2011-07-14 ENCOUNTER — Encounter (HOSPITAL_COMMUNITY): Payer: Self-pay | Admitting: Oncology

## 2011-07-14 VITALS — BP 166/69 | HR 53 | Temp 97.0°F | Wt 106.0 lb

## 2011-07-14 DIAGNOSIS — E119 Type 2 diabetes mellitus without complications: Secondary | ICD-10-CM | POA: Insufficient documentation

## 2011-07-14 DIAGNOSIS — I495 Sick sinus syndrome: Secondary | ICD-10-CM | POA: Insufficient documentation

## 2011-07-14 DIAGNOSIS — F0390 Unspecified dementia without behavioral disturbance: Secondary | ICD-10-CM | POA: Insufficient documentation

## 2011-07-14 DIAGNOSIS — K219 Gastro-esophageal reflux disease without esophagitis: Secondary | ICD-10-CM | POA: Insufficient documentation

## 2011-07-14 DIAGNOSIS — D649 Anemia, unspecified: Secondary | ICD-10-CM | POA: Insufficient documentation

## 2011-07-14 DIAGNOSIS — Z8673 Personal history of transient ischemic attack (TIA), and cerebral infarction without residual deficits: Secondary | ICD-10-CM | POA: Insufficient documentation

## 2011-07-14 DIAGNOSIS — N189 Chronic kidney disease, unspecified: Secondary | ICD-10-CM | POA: Insufficient documentation

## 2011-07-14 DIAGNOSIS — H9190 Unspecified hearing loss, unspecified ear: Secondary | ICD-10-CM | POA: Insufficient documentation

## 2011-07-14 DIAGNOSIS — D693 Immune thrombocytopenic purpura: Secondary | ICD-10-CM

## 2011-07-14 DIAGNOSIS — I2510 Atherosclerotic heart disease of native coronary artery without angina pectoris: Secondary | ICD-10-CM | POA: Insufficient documentation

## 2011-07-14 DIAGNOSIS — I509 Heart failure, unspecified: Secondary | ICD-10-CM | POA: Insufficient documentation

## 2011-07-14 DIAGNOSIS — M129 Arthropathy, unspecified: Secondary | ICD-10-CM | POA: Insufficient documentation

## 2011-07-14 DIAGNOSIS — I252 Old myocardial infarction: Secondary | ICD-10-CM | POA: Insufficient documentation

## 2011-07-14 HISTORY — DX: Immune thrombocytopenic purpura: D69.3

## 2011-07-14 LAB — BASIC METABOLIC PANEL
BUN: 28 mg/dL — ABNORMAL HIGH (ref 6–23)
Chloride: 105 mEq/L (ref 96–112)
Creatinine, Ser: 1.04 mg/dL (ref 0.50–1.10)
GFR calc Af Amer: 54 mL/min — ABNORMAL LOW (ref >90)
Glucose, Bld: 154 mg/dL — ABNORMAL HIGH (ref 70–99)

## 2011-07-14 LAB — CBC
HCT: 41.4 % (ref 36.0–46.0)
Hemoglobin: 13.4 g/dL (ref 12.0–15.0)
MCH: 27.9 pg (ref 26.0–34.0)
MCHC: 32.4 g/dL (ref 30.0–36.0)
RDW: 14.8 % (ref 11.5–15.5)

## 2011-07-14 NOTE — Progress Notes (Signed)
Cassell Smiles., MD, MD [address] [place] [address]  1. ITP (idiopathic thrombocytopenic purpura)  amLODipine (NORVASC) 2.5 MG tablet, CBC    CURRENT THERAPY: Observation  INTERVAL HISTORY: MATTEA SEGER 78 y.o. female returns for  regular  visit for followup of ITP which has been very stable and not requiring any treatment or intervention.  The patient is accompanied by her daughter today.  She reports that she is completing a second course of antibiotics for a "lung infection."  She was initially treated with Levaquin.  She was then placed on Doxycycline.  She reports that she is doing much better.  She is unaware of any sputum production.  She denies a cough.  The patient's daughter reports that she has lost approximately 6 lbs.  Her appetite is decreased.  I suspect that this is secondary to her recent infection and subsequent antibiotic treatment.  I have encouraged the patient to follow-up with her PCP if her appetite has not improved in the next 2 months.    ROS: No TIA's or unusual headaches, no dysphagia.  No prolonged cough. No dyspnea or chest pain on exertion.  No abdominal pain, change in bowel habits, black or bloody stools.  No urinary tract symptoms.  No new or unusual musculoskeletal symptoms.    Past Medical History  Diagnosis Date  . Coronary artery disease   . Ventricular tachycardia   . CHF (congestive heart failure)   . GERD (gastroesophageal reflux disease)   . Chronic kidney disease   . Atrial flutter   . Sick sinus syndrome   . Dementia   . Diabetes mellitus   . Thrombocytopenia   . Right bundle branch block   . Hyperlipidemia   . TIA (transient ischemic attack)   . Arthritis   . ITP (idiopathic thrombocytopenic purpura) 07/14/2011    Chronic low grade idiopathic thrombocytopenia purpura still not in need of therapy.     has Anemia and ITP (idiopathic thrombocytopenic purpura) on her problem list.      has no known  allergies.  Ms. Allmon does not currently have medications on file.  Past Surgical History  Procedure Date  . Coronary angioplasty with stent placement   . Biopsy thyroid     Denies any headaches, dizziness, double vision, fevers, chills, night sweats, nausea, vomiting, diarrhea, constipation, chest pain, heart palpitations, shortness of breath, blood in stool, black tarry stool, urinary pain, urinary burning, urinary frequency, hematuria.   PHYSICAL EXAMINATION  ECOG PERFORMANCE STATUS: 3 - Symptomatic, >50% confined to bed  Filed Vitals:   07/14/11 1115  BP: 166/69  Pulse: 53  Temp: 97 F (36.1 C)    GENERAL:alert, well nourished, well developed, comfortable and cooperative SKIN: skin color, texture, turgor are normal, no rashes or significant lesions HEAD: Normocephalic, No masses, lesions, tenderness or abnormalities EYES: normal, Conjunctiva are pink and non-injected EARS: External ears normal OROPHARYNX:lips, buccal mucosa, and tongue normal and mucous membranes are moist  NECK: supple, trachea midline LYMPH:  no palpable lymphadenopathy BREAST:not examined LUNGS: mild rales bibasilar, but otherwise Clear HEART: regular rate & rhythm, no murmurs, no gallops, S1 normal and S2 normal ABDOMEN:abdomen soft, non-tender and normal bowel sounds BACK: Back symmetric, no curvature. EXTREMITIES:less then 2 second capillary refill, no joint deformities, effusion, or inflammation, no skin discoloration, no clubbing, no cyanosis, positive findings:  edema trace LE pitting edema B/L.  Healing hyperpigmented area on the right LE medial side, superior to knee.  NEURO: alert &  oriented x 3 with fluent speech, no focal motor/sensory deficits   PENDING LABS: CBC, BMET    ASSESSMENT:  1. Chronic low grade idiopathic thrombocytopenia purpura still not in need of therapy.  2. Myocardial infarction in September 2008 as well as January 2012 treated at [facility].  3. Hypercholesterolemia.    4. Hypertension.  5. Right lower leg phlebitis in the past.  6. Right knee arthritis with difficulty walking.  7. Difficulty hearing, but that is stable.  8. Herpes zoster in the past.  9. Sick sinus syndrome with bradycardia.  10.Myocardial infarction in 2005 July status post stent placement at that time.  11.History of congestive heart failure with ischemic cardiomyopathy.  12.Thyroid disease.   PLAN:  1. Finish antibiotic therapy and follow-up with PCP as scheduled. 2. Lab work today: CBC, BMET 3. Will perform BMET to evaluate hydration status since she is not taking much PO 4. Follow-up with PCP regarding decreased appetite and weight loss. 5. Discussed lab work from September 2012 when she was last seen. I personally reviewed and went over laboratory results with the patient. 6. Return in 6 months for follow-up   All questions were answered. The patient knows to call the clinic with any problems, questions or concerns. We can certainly see the patient much sooner if necessary.  The patient and plan discussed with Glenford Peers, MD and he is in agreement with the aforementioned.  Micky Sheller

## 2011-07-14 NOTE — Patient Instructions (Signed)
[name]  [id number] 12-13-1921   [facility]  Discharge Instructions  RECOMMENDATIONS MADE BY THE CONSULTANT AND ANY TEST RESULTS WILL BE SENT TO YOUR REFERRING DOCTOR.   EXAM FINDINGS BY MD TODAY AND SIGNS AND SYMPTOMS TO REPORT TO CLINIC OR PRIMARY MD: will check some blood work today.  If there are any problems, we will call you.  MEDICATIONS PRESCRIBED: none   INSTRUCTIONS GIVEN AND DISCUSSED: Other :  Report unusual bruising or bleeding.  SPECIAL INSTRUCTIONS/FOLLOW-UP: Return to Clinic in 6 months to see MD.   I acknowledge that I have been informed and understand all the instructions given to me and received a copy. I do not have any more questions at this time, but understand that I may call the Specialty Clinic at [facility] at [phone] during business hours should I have any further questions or need assistance in obtaining follow-up care.    __________________________________________  _____________  __________ Signature of Patient or Authorized Representative            Date                   Time    __________________________________________ Nurse's Signature

## 2011-08-11 ENCOUNTER — Other Ambulatory Visit (HOSPITAL_COMMUNITY): Payer: Self-pay | Admitting: Oncology

## 2011-08-11 ENCOUNTER — Encounter (HOSPITAL_COMMUNITY): Payer: Medicare Other | Attending: Oncology

## 2011-08-11 DIAGNOSIS — D693 Immune thrombocytopenic purpura: Secondary | ICD-10-CM

## 2011-08-11 LAB — CBC
MCH: 27.6 pg (ref 26.0–34.0)
MCHC: 32.1 g/dL (ref 30.0–36.0)
MCV: 86.1 fL (ref 78.0–100.0)
Platelets: 36 10*3/uL — ABNORMAL LOW (ref 150–400)
RBC: 4.24 MIL/uL (ref 3.87–5.11)
RDW: 15.6 % — ABNORMAL HIGH (ref 11.5–15.5)

## 2011-08-11 NOTE — Progress Notes (Signed)
Labs drawn today for cbc 

## 2011-09-01 ENCOUNTER — Encounter (HOSPITAL_COMMUNITY): Payer: Medicare Other | Attending: Oncology

## 2011-09-01 ENCOUNTER — Other Ambulatory Visit (HOSPITAL_COMMUNITY): Payer: Self-pay | Admitting: Oncology

## 2011-09-01 DIAGNOSIS — D693 Immune thrombocytopenic purpura: Secondary | ICD-10-CM

## 2011-09-01 DIAGNOSIS — D649 Anemia, unspecified: Secondary | ICD-10-CM

## 2011-09-01 LAB — DIFFERENTIAL
Basophils Absolute: 0 10*3/uL (ref 0.0–0.1)
Eosinophils Absolute: 0 10*3/uL (ref 0.0–0.7)
Lymphocytes Relative: 20 % (ref 12–46)
Monocytes Relative: 10 % (ref 3–12)
Neutrophils Relative %: 68 % (ref 43–77)
Smear Review: DECREASED

## 2011-09-01 LAB — CBC
MCHC: 32.3 g/dL (ref 30.0–36.0)
Platelets: 68 10*3/uL — ABNORMAL LOW (ref 150–400)
RDW: 15.9 % — ABNORMAL HIGH (ref 11.5–15.5)
WBC: 3.6 10*3/uL — ABNORMAL LOW (ref 4.0–10.5)

## 2011-09-08 ENCOUNTER — Other Ambulatory Visit (HOSPITAL_COMMUNITY): Payer: Medicare Other

## 2011-10-05 ENCOUNTER — Encounter (HOSPITAL_COMMUNITY): Payer: Medicare Other | Attending: Oncology

## 2011-10-05 ENCOUNTER — Ambulatory Visit (HOSPITAL_COMMUNITY)
Admission: RE | Admit: 2011-10-05 | Discharge: 2011-10-05 | Disposition: A | Discharge status: Home / Self Care | Payer: Medicare Other | Source: Ambulatory Visit | Attending: Internal Medicine | Admitting: Internal Medicine

## 2011-10-05 ENCOUNTER — Other Ambulatory Visit (HOSPITAL_COMMUNITY): Payer: Self-pay | Admitting: Internal Medicine

## 2011-10-05 DIAGNOSIS — M25569 Pain in unspecified knee: Secondary | ICD-10-CM | POA: Insufficient documentation

## 2011-10-05 DIAGNOSIS — D693 Immune thrombocytopenic purpura: Secondary | ICD-10-CM | POA: Insufficient documentation

## 2011-10-05 DIAGNOSIS — D649 Anemia, unspecified: Secondary | ICD-10-CM | POA: Insufficient documentation

## 2011-10-05 DIAGNOSIS — M899 Disorder of bone, unspecified: Secondary | ICD-10-CM | POA: Insufficient documentation

## 2011-10-05 LAB — DIFFERENTIAL
Basophils Relative: 0 % (ref 0–1)
Eosinophils Absolute: 0 10*3/uL (ref 0.0–0.7)
Lymphs Abs: 0.9 10*3/uL (ref 0.7–4.0)
Monocytes Relative: 10 % (ref 3–12)
Neutro Abs: 2.8 10*3/uL (ref 1.7–7.7)
Neutrophils Relative %: 67 % (ref 43–77)

## 2011-10-05 LAB — CBC
Hemoglobin: 12.4 g/dL (ref 12.0–15.0)
MCH: 28 pg (ref 26.0–34.0)
Platelets: 66 10*3/uL — ABNORMAL LOW (ref 150–400)
RBC: 4.43 MIL/uL (ref 3.87–5.11)

## 2011-10-05 NOTE — Progress Notes (Signed)
Labs drawn today for cbc/diff 

## 2011-11-02 ENCOUNTER — Other Ambulatory Visit (HOSPITAL_COMMUNITY): Payer: Medicare Other

## 2011-11-03 ENCOUNTER — Other Ambulatory Visit (HOSPITAL_COMMUNITY): Payer: Medicare Other

## 2011-11-04 ENCOUNTER — Encounter (HOSPITAL_COMMUNITY): Payer: Medicare Other | Attending: Oncology

## 2011-11-04 DIAGNOSIS — D693 Immune thrombocytopenic purpura: Secondary | ICD-10-CM

## 2011-11-04 DIAGNOSIS — D649 Anemia, unspecified: Secondary | ICD-10-CM

## 2011-11-04 LAB — DIFFERENTIAL
Basophils Absolute: 0 10*3/uL (ref 0.0–0.1)
Basophils Relative: 0 % (ref 0–1)
Lymphocytes Relative: 26 % (ref 12–46)
Neutro Abs: 2.5 10*3/uL (ref 1.7–7.7)
Neutrophils Relative %: 62 % (ref 43–77)

## 2011-11-04 LAB — CBC
HCT: 41.6 % (ref 36.0–46.0)
MCHC: 32.5 g/dL (ref 30.0–36.0)
Platelets: 50 10*3/uL — ABNORMAL LOW (ref 150–400)
RDW: 15.7 % — ABNORMAL HIGH (ref 11.5–15.5)
WBC: 4.1 10*3/uL (ref 4.0–10.5)

## 2011-11-04 NOTE — Progress Notes (Signed)
Labs drawn today for cbc/diff 

## 2011-11-30 ENCOUNTER — Other Ambulatory Visit (HOSPITAL_COMMUNITY): Payer: Self-pay | Admitting: *Deleted

## 2011-11-30 ENCOUNTER — Encounter (HOSPITAL_COMMUNITY): Payer: Medicare Other | Attending: Oncology

## 2011-11-30 ENCOUNTER — Other Ambulatory Visit (HOSPITAL_COMMUNITY): Payer: Self-pay | Admitting: Oncology

## 2011-11-30 DIAGNOSIS — D693 Immune thrombocytopenic purpura: Secondary | ICD-10-CM

## 2011-11-30 DIAGNOSIS — D649 Anemia, unspecified: Secondary | ICD-10-CM | POA: Insufficient documentation

## 2011-11-30 LAB — DIFFERENTIAL
Basophils Absolute: 0 10*3/uL (ref 0.0–0.1)
Basophils Relative: 0 % (ref 0–1)
Lymphocytes Relative: 24 % (ref 12–46)
Monocytes Absolute: 0.3 10*3/uL (ref 0.1–1.0)
Monocytes Relative: 9 % (ref 3–12)
Neutro Abs: 2.5 10*3/uL (ref 1.7–7.7)
Neutrophils Relative %: 66 % (ref 43–77)

## 2011-11-30 LAB — CBC
HCT: 40.9 % (ref 36.0–46.0)
Hemoglobin: 13.7 g/dL (ref 12.0–15.0)
WBC: 3.7 10*3/uL — ABNORMAL LOW (ref 4.0–10.5)

## 2011-11-30 NOTE — Progress Notes (Signed)
Labs drawn today for cbc/diff 

## 2011-12-07 ENCOUNTER — Encounter (HOSPITAL_BASED_OUTPATIENT_CLINIC_OR_DEPARTMENT_OTHER): Payer: Medicare Other

## 2011-12-07 DIAGNOSIS — D693 Immune thrombocytopenic purpura: Secondary | ICD-10-CM

## 2011-12-07 LAB — CBC WITH DIFFERENTIAL/PLATELET
Basophils Relative: 0 % (ref 0–1)
HCT: 41.1 % (ref 36.0–46.0)
Hemoglobin: 13.6 g/dL (ref 12.0–15.0)
Lymphs Abs: 1.2 10*3/uL (ref 0.7–4.0)
MCH: 29.5 pg (ref 26.0–34.0)
MCHC: 33.1 g/dL (ref 30.0–36.0)
Monocytes Absolute: 0.4 10*3/uL (ref 0.1–1.0)
Monocytes Relative: 11 % (ref 3–12)
Neutro Abs: 2.2 10*3/uL (ref 1.7–7.7)
Neutrophils Relative %: 57 % (ref 43–77)
RBC: 4.61 MIL/uL (ref 3.87–5.11)

## 2011-12-07 NOTE — Progress Notes (Signed)
Labs drawn today for cbc/diff 

## 2012-01-11 ENCOUNTER — Encounter (HOSPITAL_COMMUNITY): Payer: Medicare Other | Attending: Oncology

## 2012-01-11 DIAGNOSIS — D649 Anemia, unspecified: Secondary | ICD-10-CM | POA: Insufficient documentation

## 2012-01-11 DIAGNOSIS — D693 Immune thrombocytopenic purpura: Secondary | ICD-10-CM | POA: Insufficient documentation

## 2012-01-11 DIAGNOSIS — R609 Edema, unspecified: Secondary | ICD-10-CM | POA: Insufficient documentation

## 2012-01-11 LAB — DIFFERENTIAL
Basophils Relative: 0 % (ref 0–1)
Eosinophils Absolute: 0 10*3/uL (ref 0.0–0.7)
Eosinophils Relative: 1 % (ref 0–5)
Lymphs Abs: 0.7 10*3/uL (ref 0.7–4.0)
Monocytes Absolute: 0.5 10*3/uL (ref 0.1–1.0)
Monocytes Relative: 13 % — ABNORMAL HIGH (ref 3–12)

## 2012-01-11 LAB — CBC
Hemoglobin: 12.6 g/dL (ref 12.0–15.0)
MCH: 29.4 pg (ref 26.0–34.0)
MCHC: 32.6 g/dL (ref 30.0–36.0)
MCV: 90.2 fL (ref 78.0–100.0)
RBC: 4.28 MIL/uL (ref 3.87–5.11)

## 2012-01-13 ENCOUNTER — Ambulatory Visit (HOSPITAL_COMMUNITY)
Admission: RE | Admit: 2012-01-13 | Discharge: 2012-01-13 | Disposition: A | Discharge status: Home / Self Care | Payer: Medicare Other | Source: Ambulatory Visit | Attending: Oncology | Admitting: Oncology

## 2012-01-13 ENCOUNTER — Encounter (HOSPITAL_COMMUNITY): Payer: Self-pay | Admitting: Oncology

## 2012-01-13 ENCOUNTER — Encounter (HOSPITAL_BASED_OUTPATIENT_CLINIC_OR_DEPARTMENT_OTHER): Payer: Medicare Other | Admitting: Oncology

## 2012-01-13 VITALS — BP 107/54 | HR 55 | Temp 97.5°F | Resp 16

## 2012-01-13 DIAGNOSIS — R600 Localized edema: Secondary | ICD-10-CM

## 2012-01-13 DIAGNOSIS — M7989 Other specified soft tissue disorders: Secondary | ICD-10-CM | POA: Insufficient documentation

## 2012-01-13 DIAGNOSIS — D693 Immune thrombocytopenic purpura: Secondary | ICD-10-CM

## 2012-01-13 DIAGNOSIS — L03119 Cellulitis of unspecified part of limb: Secondary | ICD-10-CM

## 2012-01-13 DIAGNOSIS — R609 Edema, unspecified: Secondary | ICD-10-CM

## 2012-01-13 DIAGNOSIS — L02419 Cutaneous abscess of limb, unspecified: Secondary | ICD-10-CM

## 2012-01-13 NOTE — Progress Notes (Signed)
[name]., MD [address] [place] [address]  1. ITP (idiopathic thrombocytopenic purpura)     CURRENT THERAPY: Observation  INTERVAL HISTORY: [name] 78 y.o. female returns for  regular  visit for followup of ITP which has been very stable and not requiring any treatment or intervention.   [name] is here today for follow-up of her chronic, but stable ITP.  She is accompanied by 2 of her daughters.  She is seen in a wheelchair.  She reports Right LE pain and swelling.  See physical exam below for more details.  I am worried she has a right LE cellulitis.  Due to her inability to ambulate and her spending most of her time in a wheelchair or in a recumbent position, I will check her right LE with a doppler US to rule out a DVT.  The family members report that this has been ongoing for 1- 1 /12 weeks.  She has remained afebrile and she feels well.  I spoke with [name] at mid-level at [facility] and she will see [name] for a potential cellulitis.   The daughters report that [name] urine is dark.  She only drinks about 2 glasses of fluids daily.  She denies any urinary pain, burning, frequency, suprapubic pain, and hematuria.   I personally reviewed and went over laboratory results with the patient.  Her platelet count is stable but in the 40,000's  So we will keep close eye on her platelet count.   Otherwise, hematologic ROS questioning is negative.   Past Medical History  Diagnosis Date  . Coronary artery disease   . Ventricular tachycardia   . CHF (congestive heart failure)   . GERD (gastroesophageal reflux disease)   . Chronic kidney disease   . Atrial flutter   . Sick sinus syndrome   . Dementia   . Diabetes mellitus   . Thrombocytopenia   . Right bundle branch block   . Hyperlipidemia   . TIA (transient ischemic attack)   . Arthritis   . ITP (idiopathic thrombocytopenic purpura) 07/14/2011    Chronic low grade idiopathic thrombocytopenia  purpura still not in need of therapy.   Marland Kitchen URI (upper respiratory infection)     history  . Hx: UTI (urinary tract infection)     has Anemia and ITP (idiopathic thrombocytopenic purpura) on her problem list.      has no known allergies.  [name] does not currently have medications on file.  Past Surgical History  Procedure Date  . Coronary angioplasty with stent placement   . Biopsy thyroid     Denies any headaches, dizziness, double vision, fevers, chills, night sweats, nausea, vomiting, diarrhea, constipation, chest pain, heart palpitations, shortness of breath, blood in stool, black tarry stool, urinary pain, urinary burning, urinary frequency, hematuria.   PHYSICAL EXAMINATION  ECOG PERFORMANCE STATUS: 3 - Symptomatic, >50% confined to bed  Filed Vitals:   01/13/12 1100  BP: 107/54  Pulse: 55  Temp: 97.5 F (36.4 C)  Resp: 16    GENERAL:alert, no distress, comfortable, cooperative and smiling SKIN: skin color, texture, turgor are normal, no rashes or significant lesions HEAD: Normocephalic, No masses, lesions, tenderness or abnormalities EYES: normal, Conjunctiva are pink and non-injected EARS: External ears normal OROPHARYNX:lips, buccal mucosa, and tongue normal and mucous membranes are moist  NECK: supple, trachea midline LYMPH:  not examined BREAST:not examined LUNGS: clear to auscultation  HEART: regular rate & rhythm, no murmurs, no gallops, S1 normal  and S2 normal ABDOMEN:abdomen soft, non-tender and normal bowel sounds BACK: Back symmetric, no curvature., No CVA tenderness EXTREMITIES:less then 2 second capillary refill, positive findings:  Right LE is red.  It is not hot.  She is afebrile.  Right LE is very tender to the touch diffusely.   NEURO: alert & oriented x 3 with fluent speech, no focal motor/sensory deficits   LABORATORY DATA:  Results for Dawn, [name] (MRN [id number]) as of 01/13/2012 11:15  Ref. Range 11/04/2011 12:11 11/30/2011 10:11  12/07/2011 11:22 01/11/2012 10:27  WBC Latest Range: 4.0-10.5 K/uL 4.1 3.7 (L) 3.8 (L) 3.9 (L)  RBC Latest Range: 3.87-5.11 MIL/uL 4.63 4.56 4.61 4.28  Hemoglobin Latest Range: 12.0-15.0 g/dL 22.0 25.4 27.0 62.3  HCT Latest Range: 36.0-46.0 % 41.6 40.9 41.1 38.6  MCV Latest Range: 78.0-100.0 fL 89.8 89.7 89.2 90.2  MCH Latest Range: 26.0-34.0 pg 29.2 30.0 29.5 29.4  MCHC Latest Range: 30.0-36.0 g/dL 76.2 83.1 51.7 61.6  RDW Latest Range: 11.5-15.5 % 15.7 (H) 15.1 15.1 15.7 (H)  Platelets Latest Range: 150-400 K/uL 50 (L) 45 (L) 43 (L) 43 (L)       ASSESSMENT:  1. Chronic low grade idiopathic thrombocytopenia purpura still not in need of therapy.  2. Myocardial infarction in September 2008 as well as January 2012 treated at [facility].  3. Hypercholesterolemia.  4. Hypertension.  5. Right lower leg phlebitis in the past.  6. Right knee arthritis with difficulty walking.  7. Difficulty hearing, but that is stable.  8. Herpes zoster in the past.  9. Sick sinus syndrome with bradycardia.  10.Myocardial infarction in 2005 July status post stent placement at that time.  11.History of congestive heart failure with ischemic cardiomyopathy.  12.Thyroid disease. 13. Cellulitis of right LE   PLAN:  1. I personally reviewed and went over laboratory results with the patient. 2. Lab work every 4 weeks: CBC 3. Spoke with [name] at [facility] and she will see the patient as a walk-in for potential right LE cellulitis. 4. UA of right LE to evaluate for DVT 5. Return in 4 months for follow-up.   All questions were answered. The patient knows to call the clinic with any problems, questions or concerns. We can certainly see the patient much sooner if necessary.   [name]   Addendum: [place] of right LE is negative for DVT.  Patient referred to PCP.  [name]

## 2012-01-13 NOTE — Patient Instructions (Addendum)
[facility]  Discharge Instructions  RECOMMENDATIONS MADE BY THE CONSULTANT AND ANY TEST RESULTS WILL BE SENT TO YOUR REFERRING DOCTOR.   EXAM FINDINGS BY MD TODAY AND SIGNS AND SYMPTOMS TO REPORT TO CLINIC OR PRIMARY MD: Will do an ultrasound of her right lower leg and if there's no clot she needs to be seen by one of the providers at the [place] group.  Blood counts are stable.  MEDICATIONS PRESCRIBED: none   INSTRUCTIONS GIVEN AND DISCUSSED: Other :  Report any unusual bruising or bleeding.  SPECIAL INSTRUCTIONS/FOLLOW-UP: Lab work Needed monthly and Return to Clinic for follow-up in 4 months.   I acknowledge that I have been informed and understand all the instructions given to me and received a copy. I do not have any more questions at this time, but understand that I may call the Specialty Clinic at [facility] at [phone] during business hours should I have any further questions or need assistance in obtaining follow-up care.    __________________________________________  _____________  __________ Signature of Patient or Authorized Representative            Date                   Time    __________________________________________ Nurse's Signature

## 2012-01-18 ENCOUNTER — Emergency Department (HOSPITAL_COMMUNITY)
Admission: EM | Admit: 2012-01-18 | Discharge: 2012-01-19 | Disposition: A | Discharge status: Home / Self Care | Payer: Medicare Other | Attending: Emergency Medicine | Admitting: Emergency Medicine

## 2012-01-18 ENCOUNTER — Encounter (HOSPITAL_COMMUNITY): Payer: Self-pay | Admitting: *Deleted

## 2012-01-18 ENCOUNTER — Emergency Department (HOSPITAL_COMMUNITY): Payer: Medicare Other

## 2012-01-18 DIAGNOSIS — F0390 Unspecified dementia without behavioral disturbance: Secondary | ICD-10-CM | POA: Insufficient documentation

## 2012-01-18 DIAGNOSIS — I509 Heart failure, unspecified: Secondary | ICD-10-CM | POA: Insufficient documentation

## 2012-01-18 DIAGNOSIS — Z9861 Coronary angioplasty status: Secondary | ICD-10-CM | POA: Insufficient documentation

## 2012-01-18 DIAGNOSIS — E785 Hyperlipidemia, unspecified: Secondary | ICD-10-CM | POA: Insufficient documentation

## 2012-01-18 DIAGNOSIS — N189 Chronic kidney disease, unspecified: Secondary | ICD-10-CM | POA: Insufficient documentation

## 2012-01-18 DIAGNOSIS — I2510 Atherosclerotic heart disease of native coronary artery without angina pectoris: Secondary | ICD-10-CM | POA: Insufficient documentation

## 2012-01-18 DIAGNOSIS — E119 Type 2 diabetes mellitus without complications: Secondary | ICD-10-CM | POA: Insufficient documentation

## 2012-01-18 DIAGNOSIS — M129 Arthropathy, unspecified: Secondary | ICD-10-CM | POA: Insufficient documentation

## 2012-01-18 DIAGNOSIS — J029 Acute pharyngitis, unspecified: Secondary | ICD-10-CM

## 2012-01-18 HISTORY — DX: Pneumonia, unspecified organism: J18.9

## 2012-01-18 NOTE — ED Notes (Signed)
Pt taking antibiotic for infection in lt leg, Now her throat hurts.

## 2012-01-19 MED ORDER — GI COCKTAIL ~~LOC~~
ORAL | Status: AC
  Filled 2012-01-19: qty 30

## 2012-01-19 MED ORDER — CLINDAMYCIN PALMITATE HCL 75 MG/5ML PO SOLR: 300.0000 mg | Freq: Four times a day (QID) | ORAL | Status: DC

## 2012-01-19 MED ORDER — CLINDAMYCIN PALMITATE HCL 75 MG/5ML PO SOLR
300.0000 mg | Freq: Once | ORAL | Status: AC
  Administered 2012-01-19: 300 mg via ORAL
  Filled 2012-01-19: qty 20

## 2012-01-19 MED ORDER — GI COCKTAIL ~~LOC~~
30.0000 mL | Freq: Once | ORAL | Status: AC
  Administered 2012-01-19: 30 mL via ORAL
  Filled 2012-01-19: qty 30

## 2012-01-19 MED ORDER — CLINDAMYCIN PALMITATE HCL 75 MG/5ML PO SOLR
ORAL | Status: AC
  Filled 2012-01-19: qty 1

## 2012-01-19 NOTE — Discharge Instructions (Signed)
Sore Throat Sore throats may be caused by bacteria and viruses. They may also be caused by:  Smoking.   Pollution.   Allergies.  If a sore throat is due to strep infection (a bacterial infection), you may need:  A throat swab.   A culture test to verify the strep infection.  You will need one of these:  An antibiotic shot.   Oral medicine for a full 10 days.  Strep infection is very contagious. A doctor should check any close contacts who have a sore throat or fever. A sore throat caused by a virus infection will usually last only 3-4 days. Antibiotics will not treat a viral sore throat.  Infectious mononucleosis (a viral disease), however, can cause a sore throat that lasts for up to 3 weeks. Mononucleosis can be diagnosed with blood tests. You must have been sick for at least 1 week in order for the test to give accurate results. HOME CARE INSTRUCTIONS   To treat a sore throat, take mild pain medicine.   Increase your fluids.   Eat a soft diet.   Do not smoke.   Gargling with warm water or salt water (1 tsp. salt in 8 oz. water) can be helpful.   Try throat sprays or lozenges or sucking on hard candy to ease the symptoms.  Call your doctor if your sore throat lasts longer than 1 week.  SEEK IMMEDIATE MEDICAL CARE IF:  You have difficulty breathing.   You have increased swelling in the throat.   You have pain so severe that you are unable to swallow fluids or your saliva.   You have a severe headache, a high fever, vomiting, or a red rash.  Document Released: 05/14/2004 Document Revised: 03/26/2011 Document Reviewed: 03/24/2007 [facility] Patient Information 2012 [place], [place].   Call Dr Sherwood Gambler this morning for further advice if you do not tolerate the liquid form of clindamycin as prescribed to you.

## 2012-01-19 NOTE — ED Provider Notes (Signed)
Medical screening examination/treatment/procedure(s) were conducted as a shared visit with non-physician practitioner(s) and myself.  I personally evaluated the patient during the encounter  Patient awake alert no acute distress. We will switch the patient to liquid clindamycin as the size of the capsules may be making it difficult to swallow. The patient's right lower leg is tender but but without significant edema.  Hanley Seamen, MD 01/19/12 [phone]

## 2012-01-19 NOTE — ED Provider Notes (Signed)
History     CSN: [id number]  Arrival date & time 01/18/12  2034   First MD Initiated Contact with Patient 01/18/12 2320      Chief Complaint  Patient presents with  . Sore Throat    (Consider location/radiation/quality/duration/timing/severity/associated sxs/prior treatment) HPI Comments: [name] presents for assistance with intolerance to her antibiotic capsules which are "hanging" in her throat and causing pain.  She started clindamycin this week for a right lower extremity infection (cellulitis?) and called her pcp today to advise of this intolerance, but did not get a call back from them,  So presented here.  The patient has dementia and family gives most of her history.  They have tried mixing the clindamycin powder in applesauce,  But the patient reports her pain was even worse then she tried taking the medicine this way.  Daughter also reports she has had and increased cough with occasional clear sputum production.  She has had no fever, chills, nausea or vomiting.  The history is provided by a relative and the patient. The history is limited by the condition of the patient.    Past Medical History  Diagnosis Date  . Coronary artery disease   . Ventricular tachycardia   . CHF (congestive heart failure)   . GERD (gastroesophageal reflux disease)   . Atrial flutter   . Sick sinus syndrome   . Dementia   . Diabetes mellitus   . Thrombocytopenia   . Right bundle branch block   . Hyperlipidemia   . TIA (transient ischemic attack)   . Arthritis   . ITP (idiopathic thrombocytopenic purpura) 07/14/2011    Chronic low grade idiopathic thrombocytopenia purpura still not in need of therapy.   Marland Kitchen URI (upper respiratory infection)     history  . Hx: UTI (urinary tract infection)   . Chronic kidney disease   . Pneumonia     Past Surgical History  Procedure Date  . Coronary angioplasty with stent placement   . Biopsy thyroid     History reviewed. No pertinent family  history.  History  Substance Use Topics  . Smoking status: Never Smoker   . Smokeless tobacco: Never Used  . Alcohol Use: No    OB History    Grav Para Term Preterm Abortions TAB SAB Ect Mult Living                  Review of Systems  Constitutional: Negative for fever.  HENT: Negative for congestion, sore throat and neck pain.   Eyes: Negative.   Respiratory: Positive for cough. Negative for chest tightness and shortness of breath.   Cardiovascular: Negative for chest pain.  Gastrointestinal: Negative for nausea and abdominal pain.  Genitourinary: Negative.   Musculoskeletal: Negative for joint swelling and arthralgias.  Skin: Positive for color change. Negative for rash and wound.  Neurological: Negative for dizziness, weakness, light-headedness, numbness and headaches.  Hematological: Negative.   Psychiatric/Behavioral: Negative.     Allergies  Review of patient's allergies indicates no known allergies.  Home Medications   Current Outpatient Rx  Name Route Sig Dispense Refill  . AMLODIPINE BESYLATE 2.5 MG PO TABS Oral Take 2.5 mg by mouth daily.    . ASPIRIN 81 MG PO TABS Oral Take 81 mg by mouth daily.     Marland Kitchen CLINDAMYCIN PALMITATE HCL 75 MG/5ML PO SOLR Oral Take 20 mLs (300 mg total) by mouth 4 (four) times daily. 400 mL 0  . DOCUSATE SODIUM 100 MG PO  CAPS Oral Take 50 mg by mouth at bedtime.     . GUAIFENESIN ER 600 MG PO TB12 Oral Take 600 mg by mouth every morning.      . ISOSORBIDE MONONITRATE ER 60 MG PO TB24 Oral Take 60 mg by mouth daily with breakfast.      . LISINOPRIL 10 MG PO TABS Oral Take 10 mg by mouth daily. Takes 1/2 tablet daily    . MULTIVITAMIN/IRON PO TABS Oral Take 1 tablet by mouth every morning.      Marland Kitchen NITROGLYCERIN 0.4 MG/SPRAY TL SOLN Sublingual Place 2 sprays under the tongue every 5 (five) minutes as needed.     Marland Kitchen PANTOPRAZOLE SODIUM 40 MG PO TBEC Oral Take 40 mg by mouth daily.     Marland Kitchen POTASSIUM CHLORIDE CRYS ER 20 MEQ PO TBCR Oral Take 20  mEq by mouth daily.      Marland Kitchen SIMVASTATIN 20 MG PO TABS Oral Take 20 mg by mouth at bedtime.     . SOLIFENACIN SUCCINATE 5 MG PO TABS Oral Take 5 mg by mouth daily.       BP 150/69  Pulse 68  Temp 97.7 F (36.5 C) (Oral)  Resp 18  Ht 5\' 1"  (1.549 m)  Wt 112 lb (50.803 kg)  BMI 21.16 kg/m2  SpO2 96%  Physical Exam  Nursing note and vitals reviewed. Constitutional: She appears well-developed and well-nourished.  HENT:  Head: Normocephalic and atraumatic.  Mouth/Throat: Oropharynx is clear and moist.  Eyes: Conjunctivae normal are normal.  Neck: Normal range of motion. Neck supple.  Cardiovascular: Normal rate, regular rhythm, normal heart sounds and intact distal pulses.   Pulmonary/Chest: Effort normal and breath sounds normal. She has no wheezes. She has no rales. She exhibits no tenderness.  Abdominal: Soft. Bowel sounds are normal. There is no tenderness.  Musculoskeletal: Normal range of motion.  Neurological: She is alert.  Skin: Skin is warm and dry.       Right lower extremity with chronic venous skin changes,  Slight anterior erythema.  Dorsalis pedis pulse adequate.  Psychiatric: She has a normal mood and affect.    ED Course  Procedures (including critical care time)  Labs Reviewed - No data to display Dg Chest 2 View  01/19/2012  *RADIOLOGY REPORT*  Clinical Data: Cough and congestion.  CHEST - 2 VIEW  Comparison: 06/09/2011  Findings: The cardiopericardial silhouette is enlarged.  No focal consolidation or pleural effusion.  Density in the medial right lung base may be related to aspirated contrast.  Right paratracheal opacity is unchanged.  Bones are diffusely demineralized.  IMPRESSION: Stable.  Cardiomegaly without acute cardiopulmonary findings.   Original Report Authenticated By: ERIC A. MANSELL, M.D.      1. Pharyngitis       MDM  Pt also seen by Dr Read Drivers prior to dc home.  Recommended trial of liquid clindamycin which pt may tolerate better.  First dose  given in ed,  And prescription given.  Also given gi cocktail which did help relieve her throat discomfort.  Family advised to call pcp in am for further advice regarding antibiotic choices if she does not tolerate the liquid clindamycin.  Family agrees with and understands plan.  CXR reviewed prior to dc home.    The patient appears reasonably screened and/or stabilized for discharge and I doubt any other medical condition or other [facility] requiring further screening, evaluation, or treatment in the ED at this time prior to discharge.  Burgess Amor, PA 01/19/12 0246  Burgess Amor, PA 01/19/12 [phone]

## 2012-02-12 ENCOUNTER — Encounter (HOSPITAL_COMMUNITY): Payer: Medicare Other | Attending: Oncology

## 2012-02-12 DIAGNOSIS — D693 Immune thrombocytopenic purpura: Secondary | ICD-10-CM | POA: Insufficient documentation

## 2012-02-12 LAB — CBC
MCH: 30 pg (ref 26.0–34.0)
MCHC: 32.7 g/dL (ref 30.0–36.0)
MCV: 91.8 fL (ref 78.0–100.0)
Platelets: 47 10*3/uL — ABNORMAL LOW (ref 150–400)
RDW: 14.9 % (ref 11.5–15.5)

## 2012-02-12 NOTE — Progress Notes (Signed)
Labs drawn today for cbc 

## 2012-03-14 ENCOUNTER — Encounter (HOSPITAL_COMMUNITY): Payer: Medicare Other | Attending: Oncology

## 2012-03-14 DIAGNOSIS — D693 Immune thrombocytopenic purpura: Secondary | ICD-10-CM | POA: Insufficient documentation

## 2012-03-14 LAB — CBC
HCT: 39.7 % (ref 36.0–46.0)
MCHC: 33 g/dL (ref 30.0–36.0)
Platelets: 52 10*3/uL — ABNORMAL LOW (ref 150–400)
RDW: 14.7 % (ref 11.5–15.5)
WBC: 3.8 10*3/uL — ABNORMAL LOW (ref 4.0–10.5)

## 2012-03-14 NOTE — Progress Notes (Signed)
Labs drawn today for cbc 

## 2012-04-12 ENCOUNTER — Encounter (HOSPITAL_COMMUNITY): Payer: Medicare Other | Attending: Oncology

## 2012-04-12 ENCOUNTER — Other Ambulatory Visit (HOSPITAL_COMMUNITY): Payer: Medicare Other

## 2012-04-12 DIAGNOSIS — D693 Immune thrombocytopenic purpura: Secondary | ICD-10-CM | POA: Insufficient documentation

## 2012-04-12 LAB — CBC
MCH: 29.7 pg (ref 26.0–34.0)
Platelets: 56 10*3/uL — ABNORMAL LOW (ref 150–400)
RBC: 4.45 MIL/uL (ref 3.87–5.11)

## 2012-04-12 NOTE — Progress Notes (Signed)
[name]'s reason for visit today are for labs as scheduled per MD orders.  Venipuncture performed with a 23 gauge butterfly needle to R Antecubital.  [name] tolerated venipuncture well and without incident; questions were answered and patient was discharged.

## 2012-04-19 ENCOUNTER — Emergency Department (HOSPITAL_COMMUNITY): Payer: Medicare Other

## 2012-04-19 ENCOUNTER — Encounter (HOSPITAL_COMMUNITY): Payer: Self-pay | Admitting: *Deleted

## 2012-04-19 ENCOUNTER — Emergency Department (HOSPITAL_COMMUNITY)
Admission: EM | Admit: 2012-04-19 | Discharge: 2012-04-19 | Disposition: A | Discharge status: Home / Self Care | Payer: Medicare Other | Attending: Emergency Medicine | Admitting: Emergency Medicine

## 2012-04-19 DIAGNOSIS — E119 Type 2 diabetes mellitus without complications: Secondary | ICD-10-CM | POA: Insufficient documentation

## 2012-04-19 DIAGNOSIS — Z8709 Personal history of other diseases of the respiratory system: Secondary | ICD-10-CM | POA: Insufficient documentation

## 2012-04-19 DIAGNOSIS — R5381 Other malaise: Secondary | ICD-10-CM | POA: Insufficient documentation

## 2012-04-19 DIAGNOSIS — Z7982 Long term (current) use of aspirin: Secondary | ICD-10-CM | POA: Insufficient documentation

## 2012-04-19 DIAGNOSIS — I2510 Atherosclerotic heart disease of native coronary artery without angina pectoris: Secondary | ICD-10-CM | POA: Insufficient documentation

## 2012-04-19 DIAGNOSIS — Z862 Personal history of diseases of the blood and blood-forming organs and certain disorders involving the immune mechanism: Secondary | ICD-10-CM | POA: Insufficient documentation

## 2012-04-19 DIAGNOSIS — Z8673 Personal history of transient ischemic attack (TIA), and cerebral infarction without residual deficits: Secondary | ICD-10-CM | POA: Insufficient documentation

## 2012-04-19 DIAGNOSIS — F0390 Unspecified dementia without behavioral disturbance: Secondary | ICD-10-CM | POA: Insufficient documentation

## 2012-04-19 DIAGNOSIS — Z8739 Personal history of other diseases of the musculoskeletal system and connective tissue: Secondary | ICD-10-CM | POA: Insufficient documentation

## 2012-04-19 DIAGNOSIS — Z79899 Other long term (current) drug therapy: Secondary | ICD-10-CM | POA: Insufficient documentation

## 2012-04-19 DIAGNOSIS — I495 Sick sinus syndrome: Secondary | ICD-10-CM | POA: Insufficient documentation

## 2012-04-19 DIAGNOSIS — K5900 Constipation, unspecified: Secondary | ICD-10-CM | POA: Insufficient documentation

## 2012-04-19 DIAGNOSIS — I509 Heart failure, unspecified: Secondary | ICD-10-CM | POA: Insufficient documentation

## 2012-04-19 DIAGNOSIS — Z8701 Personal history of pneumonia (recurrent): Secondary | ICD-10-CM | POA: Insufficient documentation

## 2012-04-19 DIAGNOSIS — K219 Gastro-esophageal reflux disease without esophagitis: Secondary | ICD-10-CM | POA: Insufficient documentation

## 2012-04-19 DIAGNOSIS — N189 Chronic kidney disease, unspecified: Secondary | ICD-10-CM | POA: Insufficient documentation

## 2012-04-19 DIAGNOSIS — Z87440 Personal history of urinary (tract) infections: Secondary | ICD-10-CM | POA: Insufficient documentation

## 2012-04-19 DIAGNOSIS — Z9861 Coronary angioplasty status: Secondary | ICD-10-CM | POA: Insufficient documentation

## 2012-04-19 DIAGNOSIS — Z8679 Personal history of other diseases of the circulatory system: Secondary | ICD-10-CM | POA: Insufficient documentation

## 2012-04-19 DIAGNOSIS — E785 Hyperlipidemia, unspecified: Secondary | ICD-10-CM | POA: Insufficient documentation

## 2012-04-19 DIAGNOSIS — R4789 Other speech disturbances: Secondary | ICD-10-CM | POA: Insufficient documentation

## 2012-04-19 DIAGNOSIS — R531 Weakness: Secondary | ICD-10-CM

## 2012-04-19 LAB — CBC WITH DIFFERENTIAL/PLATELET
Basophils Absolute: 0 10*3/uL (ref 0.0–0.1)
Basophils Relative: 0 % (ref 0–1)
Eosinophils Absolute: 0.1 10*3/uL (ref 0.0–0.7)
Eosinophils Relative: 2 % (ref 0–5)
HCT: 40.7 % (ref 36.0–46.0)
Hemoglobin: 13.3 g/dL (ref 12.0–15.0)
Lymphocytes Relative: 22 % (ref 12–46)
Lymphs Abs: 1.2 10*3/uL (ref 0.7–4.0)
MCH: 29.7 pg (ref 26.0–34.0)
MCHC: 32.7 g/dL (ref 30.0–36.0)
MCV: 90.8 fL (ref 78.0–100.0)
Monocytes Absolute: 0.5 10*3/uL (ref 0.1–1.0)
Monocytes Relative: 10 % (ref 3–12)
Neutro Abs: 3.5 10*3/uL (ref 1.7–7.7)
Neutrophils Relative %: 66 % (ref 43–77)
Platelets: 56 10*3/uL — ABNORMAL LOW (ref 150–400)
RBC: 4.48 MIL/uL (ref 3.87–5.11)
RDW: 14.5 % (ref 11.5–15.5)
WBC: 5.3 10*3/uL (ref 4.0–10.5)

## 2012-04-19 LAB — URINE MICROSCOPIC-ADD ON

## 2012-04-19 LAB — COMPREHENSIVE METABOLIC PANEL
ALT: 19 U/L (ref 0–35)
AST: 29 U/L (ref 0–37)
Albumin: 3.7 g/dL (ref 3.5–5.2)
Alkaline Phosphatase: 61 U/L (ref 39–117)
BUN: 14 mg/dL (ref 6–23)
CO2: 26 mEq/L (ref 19–32)
Calcium: 9.8 mg/dL (ref 8.4–10.5)
Chloride: 103 mEq/L (ref 96–112)
Creatinine, Ser: 1.07 mg/dL (ref 0.50–1.10)
GFR calc Af Amer: 52 mL/min — ABNORMAL LOW (ref >90)
GFR calc non Af Amer: 45 mL/min — ABNORMAL LOW (ref >90)
Glucose, Bld: 102 mg/dL — ABNORMAL HIGH (ref 70–99)
Potassium: 4.4 mEq/L (ref 3.5–5.1)
Sodium: 138 mEq/L (ref 135–145)
Total Bilirubin: 0.5 mg/dL (ref 0.3–1.2)
Total Protein: 6.8 g/dL (ref 6.0–8.3)

## 2012-04-19 LAB — URINALYSIS, ROUTINE W REFLEX MICROSCOPIC
Glucose, UA: NEGATIVE mg/dL
Protein, ur: NEGATIVE mg/dL
pH: 5.5 (ref 5.0–8.0)

## 2012-04-19 LAB — GLUCOSE, CAPILLARY: Glucose-Capillary: 74 mg/dL (ref 70–99)

## 2012-04-19 MED ORDER — SODIUM CHLORIDE 0.9 % IV BOLUS (SEPSIS)
1000.0000 mL | Freq: Once | INTRAVENOUS | Status: AC
  Administered 2012-04-19: 1000 mL via INTRAVENOUS

## 2012-04-19 NOTE — ED Provider Notes (Signed)
History  This chart was scribed for Gerhard Munch, MD by Bennett Scrape, ED Scribe. This patient was seen in room APA06/APA06 and the patient's care was started at 8:01 PM.  CSN: [id number]  Arrival date & time 04/19/12  1955   First MD Initiated Contact with Patient 04/19/12 2001      Chief Complaint  Patient presents with  . Weakness     The history is provided by the patient. No language interpreter was used.    [name] is a 78 y.o. female who presents to the Emergency Department with her daughter who is complaining of an episode that occurred approximately 12  hours PTA.  There was weakness with associated slurred speech. Daughter reports that the slurred speech resolved after 2 to 3 hours but the weakness is still present. Daughter states that the pt has a h/o prior TIAs but is unsure what the symptoms were. Daughter also reports chronic constipation  / urinary issues which the pt takes vesicare for. Pt denies having SOB, new abdominal pain, nausea as associated symptoms.  She denies any pain, disorientation, confusion.  She endorses generalized weakness.  She has a h/o CAD, CHF, DM, HLD and GERD. She denies smoking and alcohol use.   PCP is Dr. Sherwood Gambler.  Past Medical History  Diagnosis Date  . Coronary artery disease   . Ventricular tachycardia   . CHF (congestive heart failure)   . GERD (gastroesophageal reflux disease)   . Atrial flutter   . Sick sinus syndrome   . Dementia   . Diabetes mellitus   . Thrombocytopenia   . Right bundle branch block   . Hyperlipidemia   . TIA (transient ischemic attack)   . Arthritis   . ITP (idiopathic thrombocytopenic purpura) 07/14/2011    Chronic low grade idiopathic thrombocytopenia purpura still not in need of therapy.   Marland Kitchen URI (upper respiratory infection)     history  . Hx: UTI (urinary tract infection)   . Chronic kidney disease   . Pneumonia     Past Surgical History  Procedure Date  . Coronary angioplasty with  stent placement   . Biopsy thyroid     History reviewed. No pertinent family history.  History  Substance Use Topics  . Smoking status: Never Smoker   . Smokeless tobacco: Never Used  . Alcohol Use: No    No OB history provided.   Review of Systems  Constitutional:       Per HPI, otherwise negative  HENT:       Per HPI, otherwise negative  Eyes: Negative.   Respiratory:       Per HPI, otherwise negative  Cardiovascular:       Per HPI, otherwise negative  Genitourinary: Negative.   Musculoskeletal:       Per HPI, otherwise negative  Skin: Negative.   Neurological: Positive for speech difficulty and weakness. Negative for syncope.    Allergies  Review of patient's allergies indicates no known allergies.  Home Medications   Current Outpatient Rx  Name  Route  Sig  Dispense  Refill  . AMLODIPINE BESYLATE 2.5 MG PO TABS   Oral   Take 2.5 mg by mouth daily.         . ASPIRIN 81 MG PO TABS   Oral   Take 81 mg by mouth daily.          . GUAIFENESIN ER 600 MG PO TB12   Oral   Take 600 mg by  mouth every morning.           . ISOSORBIDE MONONITRATE ER 60 MG PO TB24   Oral   Take 60 mg by mouth daily with breakfast.           . LISINOPRIL 10 MG PO TABS   Oral   Take 10 mg by mouth daily. Takes 1/2 tablet daily         . MULTIVITAMIN/IRON PO TABS   Oral   Take 1 tablet by mouth every morning.           Marland Kitchen NITROGLYCERIN 0.4 MG/SPRAY TL SOLN   Sublingual   Place 2 sprays under the tongue every 5 (five) minutes as needed.          Marland Kitchen PANTOPRAZOLE SODIUM 40 MG PO TBEC   Oral   Take 40 mg by mouth daily.          Marland Kitchen POTASSIUM CHLORIDE CRYS ER 20 MEQ PO TBCR   Oral   Take 20 mEq by mouth daily.           Marland Kitchen SIMVASTATIN 20 MG PO TABS   Oral   Take 20 mg by mouth at bedtime.          . SOLIFENACIN SUCCINATE 5 MG PO TABS   Oral   Take 5 mg by mouth daily.            Triage Vitals: BP 129/60  Pulse 59  Temp 98.7 F (37.1 C) (Oral)   Ht 4\' 8"  (1.422 m)  Wt 110 lb (49.896 kg)  BMI 24.66 kg/m2  SpO2 100%  Physical Exam  Nursing note and vitals reviewed. Constitutional: She is oriented to person, place, and time. She appears cachectic. She has a sickly appearance. No distress.  HENT:  Head: Normocephalic and atraumatic.  Eyes: Conjunctivae normal and EOM are normal.  Cardiovascular: Normal rate and regular rhythm.   Murmur heard. Pulmonary/Chest: Effort normal. No stridor. No respiratory distress.       Quite lung sounds  Abdominal: She exhibits no distension.  Musculoskeletal: She exhibits edema.       lower extremities are symmetrically edematous   Neurological: She is alert and oriented to person, place, and time. She displays atrophy. A cranial nerve deficit is present. No sensory deficit. She exhibits abnormal muscle tone. Coordination normal.       Good symmetric grip strength bilaterally, chronic left eye droop, no new facial droop, per the patient and her daughter.  Skin: Skin is warm and dry.  Psychiatric: She has a normal mood and affect.    ED Course  Procedures (including critical care time)  DIAGNOSTIC STUDIES: Oxygen Saturation is 100% on room air, normal by my interpretation.    COORDINATION OF CARE: 8:20 PM- Discussed treatment plan which includes CBC panel, urinalysis and CT scan with pt and her daughter at bedside and both agreed to plan.  Update: The patient remains calm, sleeping.  I discussed all findings with the patient's daughter.  Labs Reviewed - No data to display No results found.   No diagnosis found.    MDM   I personally performed the services described in this documentation, which was scribed in my presence. The recorded information has been reviewed and is accurate.   This elderly female with multiple medical problems, including prior TIA, ITP, dementia now presents after an episode of dysarthria, with ongoing weakness.  On exam the patient denies any complaints beyond  generalized weakness.  She  seems neurologically similar to her baseline, and denies any changes.  The patient's speech is intelligible.  With her complaints there is a suspicion of TIA/CVA versus infectious etiology, with consideration of other causes.  The patient's labs were consistent with those from one week ago, and her CT, a chest x-ray did not demonstrate acute new pathology.  The TIA remains a consideration, she started taking aspirin, and given her multiple comorbidities is not a candidate for additional intervention, given the absence of ongoing symptoms, acute changes.  I discussed the need for close outpatient monitoring, and additional evaluation of the patient's daughter, and the patient was discharged in stable condition.   Gerhard Munch, MD 04/19/12 [phone]

## 2012-04-19 NOTE — ED Notes (Signed)
Attempted in & out cath for UA. Pt resistant. Now on bedpan

## 2012-04-19 NOTE — ED Notes (Signed)
Pt had weakness and slurred speech since awakening this morning, per pt's daughter "the slurred speech got better after she drank water".

## 2012-05-13 ENCOUNTER — Encounter (HOSPITAL_COMMUNITY): Payer: Medicare Other | Attending: Oncology

## 2012-05-13 ENCOUNTER — Other Ambulatory Visit (HOSPITAL_COMMUNITY): Payer: Medicare Other

## 2012-05-13 DIAGNOSIS — D693 Immune thrombocytopenic purpura: Secondary | ICD-10-CM | POA: Insufficient documentation

## 2012-05-13 LAB — CBC
Hemoglobin: 12.7 g/dL (ref 12.0–15.0)
MCH: 29.2 pg (ref 26.0–34.0)
MCHC: 32.2 g/dL (ref 30.0–36.0)
Platelets: 51 10*3/uL — ABNORMAL LOW (ref 150–400)
RDW: 14.6 % (ref 11.5–15.5)

## 2012-05-13 NOTE — Progress Notes (Signed)
Labs drawn today for cbc 

## 2012-05-17 ENCOUNTER — Encounter (HOSPITAL_COMMUNITY): Payer: Self-pay | Admitting: Oncology

## 2012-05-17 ENCOUNTER — Encounter (HOSPITAL_BASED_OUTPATIENT_CLINIC_OR_DEPARTMENT_OTHER): Payer: Medicare Other | Admitting: Oncology

## 2012-05-17 VITALS — BP 103/58 | HR 64 | Temp 97.8°F | Resp 16

## 2012-05-17 DIAGNOSIS — D693 Immune thrombocytopenic purpura: Secondary | ICD-10-CM

## 2012-05-17 NOTE — Patient Instructions (Addendum)
[facility] Discharge Instructions  RECOMMENDATIONS MADE BY THE CONSULTANT AND ANY TEST RESULTS WILL BE SENT TO YOUR REFERRING PHYSICIAN.  EXAM FINDINGS BY THE PHYSICIAN TODAY AND SIGNS OR SYMPTOMS TO REPORT TO CLINIC OR PRIMARY PHYSICIAN: Discussion by MD.  Has irritated area in her nostril.  Using a cotton swab you can apply vaseline or neosporin twice daily for couple of weeks.  Please trim her fingernails.  MEDICATIONS PRESCRIBED:  none  INSTRUCTIONS GIVEN AND DISCUSSED: Report unusual bruising or bleeding.  SPECIAL INSTRUCTIONS/FOLLOW-UP: Blood work every 3 months and to see PA  Thank you for choosing [facility] to provide your oncology and hematology care.  To afford each patient quality time with our providers, please arrive at least 15 minutes before your scheduled appointment time.  With your help, our goal is to use those 15 minutes to complete the necessary work-up to ensure our physicians have the information they need to help with your evaluation and healthcare recommendations.    Effective January 1st, 2014, we ask that you re-schedule your appointment with our physicians should you arrive 10 or more minutes late for your appointment.  We strive to give you quality time with our providers, and arriving late affects you and other patients whose appointments are after yours.    Again, thank you for choosing [facility].  Our hope is that these requests will decrease the amount of time that you wait before being seen by our physicians.       _____________________________________________________________  Should you have questions after your visit to [facility], please contact our office at [phone] between the hours of 8:30 a.m. and 5:00 p.m.  Voicemails left after 4:30 p.m. will not be returned until the following business day.  For prescription refill requests, have your pharmacy contact our office with your  prescription refill request.

## 2012-05-17 NOTE — Progress Notes (Signed)
Problem number 1 ITP, chronic, stable. She is not having bleeding except from the left side of her nose. She has erosive area approximately 8 x 9 mm. It is actively ureter dated with some blood on the surface superficially. Her daughters who are with her noticed that she aggravates this area upper nose on a regular basis with her finger nails.  Other than that she has no petechiae there obvious underlying his or on her conjunctiva or in her mouth.  She also complains of chronic aching in her legs but she does have degenerative joint changes in truly isn't walking anymore essentially. She comes today in the wheelchair and his course 79 years old and very weak otherwise. Her hemoglobin remains stable white count is occasionally low at 3700 as it was the other day. Platelets the other day were 51,000 as they were essentially on 11/04/2011. She still also complains of pain in both legs more on the right lower extremity than the left. She has chronic venous insufficiency changes with hyperpigmentation and chronic swelling there and is very sensitive to even mild palpation. The left is not nearly as swollen but is also still tender.  I discussed these issues with her daughters and her especially the left nasal erosions. The right nares is very clear.  We will just check her blood counts in 3 months and 6 months and see her in 6 months. I do not think we need to do monthly blood counts any longer at this time. The daughters know to bring her back should she have any bleeding issues.

## 2012-07-04 ENCOUNTER — Other Ambulatory Visit (HOSPITAL_COMMUNITY): Payer: Self-pay | Admitting: Family Medicine

## 2012-07-04 ENCOUNTER — Ambulatory Visit (HOSPITAL_COMMUNITY)
Admission: RE | Admit: 2012-07-04 | Discharge: 2012-07-04 | Disposition: A | Discharge status: Home / Self Care | Payer: Medicare Other | Source: Ambulatory Visit | Attending: Family Medicine | Admitting: Family Medicine

## 2012-07-04 DIAGNOSIS — R059 Cough, unspecified: Secondary | ICD-10-CM | POA: Insufficient documentation

## 2012-07-04 DIAGNOSIS — R05 Cough: Secondary | ICD-10-CM

## 2012-08-15 ENCOUNTER — Encounter (HOSPITAL_COMMUNITY): Payer: Medicare Other | Attending: Oncology

## 2012-08-15 DIAGNOSIS — D693 Immune thrombocytopenic purpura: Secondary | ICD-10-CM | POA: Insufficient documentation

## 2012-08-15 LAB — CBC WITH DIFFERENTIAL/PLATELET
Basophils Absolute: 0 10*3/uL (ref 0.0–0.1)
Lymphocytes Relative: 35 % (ref 12–46)
Lymphs Abs: 1 10*3/uL (ref 0.7–4.0)
Neutro Abs: 1.4 10*3/uL — ABNORMAL LOW (ref 1.7–7.7)
Neutrophils Relative %: 50 % (ref 43–77)
Platelets: 47 10*3/uL — ABNORMAL LOW (ref 150–400)
RBC: 4.23 MIL/uL (ref 3.87–5.11)
RDW: 15.9 % — ABNORMAL HIGH (ref 11.5–15.5)
WBC: 2.8 10*3/uL — ABNORMAL LOW (ref 4.0–10.5)

## 2012-08-15 NOTE — Progress Notes (Signed)
Labs drawn today for cbc/diff 

## 2012-09-16 ENCOUNTER — Other Ambulatory Visit: Payer: Self-pay

## 2012-09-16 ENCOUNTER — Inpatient Hospital Stay (HOSPITAL_COMMUNITY)
Admission: EM | Admit: 2012-09-16 | Discharge: 2012-09-20 | DRG: 309 | Disposition: A | Discharge status: Home Health | Payer: Medicare Other | Attending: Internal Medicine | Admitting: Internal Medicine

## 2012-09-16 ENCOUNTER — Encounter (HOSPITAL_COMMUNITY): Payer: Self-pay | Admitting: *Deleted

## 2012-09-16 ENCOUNTER — Emergency Department (HOSPITAL_COMMUNITY): Payer: Medicare Other

## 2012-09-16 DIAGNOSIS — I4892 Unspecified atrial flutter: Secondary | ICD-10-CM | POA: Diagnosis present

## 2012-09-16 DIAGNOSIS — K219 Gastro-esophageal reflux disease without esophagitis: Secondary | ICD-10-CM | POA: Diagnosis present

## 2012-09-16 DIAGNOSIS — Z993 Dependence on wheelchair: Secondary | ICD-10-CM

## 2012-09-16 DIAGNOSIS — I4510 Unspecified right bundle-branch block: Secondary | ICD-10-CM | POA: Diagnosis present

## 2012-09-16 DIAGNOSIS — R5381 Other malaise: Secondary | ICD-10-CM

## 2012-09-16 DIAGNOSIS — I5022 Chronic systolic (congestive) heart failure: Secondary | ICD-10-CM | POA: Diagnosis present

## 2012-09-16 DIAGNOSIS — M25561 Pain in right knee: Secondary | ICD-10-CM

## 2012-09-16 DIAGNOSIS — I472 Ventricular tachycardia, unspecified: Principal | ICD-10-CM | POA: Diagnosis present

## 2012-09-16 DIAGNOSIS — I369 Nonrheumatic tricuspid valve disorder, unspecified: Secondary | ICD-10-CM

## 2012-09-16 DIAGNOSIS — D693 Immune thrombocytopenic purpura: Secondary | ICD-10-CM | POA: Diagnosis present

## 2012-09-16 DIAGNOSIS — R05 Cough: Secondary | ICD-10-CM

## 2012-09-16 DIAGNOSIS — R531 Weakness: Secondary | ICD-10-CM

## 2012-09-16 DIAGNOSIS — Z9861 Coronary angioplasty status: Secondary | ICD-10-CM

## 2012-09-16 DIAGNOSIS — N189 Chronic kidney disease, unspecified: Secondary | ICD-10-CM | POA: Diagnosis present

## 2012-09-16 DIAGNOSIS — N181 Chronic kidney disease, stage 1: Secondary | ICD-10-CM | POA: Diagnosis present

## 2012-09-16 DIAGNOSIS — D6959 Other secondary thrombocytopenia: Secondary | ICD-10-CM | POA: Diagnosis present

## 2012-09-16 DIAGNOSIS — F0390 Unspecified dementia without behavioral disturbance: Secondary | ICD-10-CM | POA: Diagnosis present

## 2012-09-16 DIAGNOSIS — R1310 Dysphagia, unspecified: Secondary | ICD-10-CM | POA: Diagnosis present

## 2012-09-16 DIAGNOSIS — I509 Heart failure, unspecified: Secondary | ICD-10-CM | POA: Diagnosis present

## 2012-09-16 DIAGNOSIS — I872 Venous insufficiency (chronic) (peripheral): Secondary | ICD-10-CM | POA: Diagnosis present

## 2012-09-16 DIAGNOSIS — Z66 Do not resuscitate: Secondary | ICD-10-CM | POA: Diagnosis present

## 2012-09-16 DIAGNOSIS — Z79899 Other long term (current) drug therapy: Secondary | ICD-10-CM

## 2012-09-16 DIAGNOSIS — E785 Hyperlipidemia, unspecified: Secondary | ICD-10-CM | POA: Diagnosis present

## 2012-09-16 DIAGNOSIS — I4729 Other ventricular tachycardia: Principal | ICD-10-CM | POA: Diagnosis present

## 2012-09-16 DIAGNOSIS — D649 Anemia, unspecified: Secondary | ICD-10-CM | POA: Diagnosis present

## 2012-09-16 DIAGNOSIS — I5023 Acute on chronic systolic (congestive) heart failure: Secondary | ICD-10-CM | POA: Diagnosis present

## 2012-09-16 DIAGNOSIS — I495 Sick sinus syndrome: Secondary | ICD-10-CM | POA: Diagnosis present

## 2012-09-16 DIAGNOSIS — I2510 Atherosclerotic heart disease of native coronary artery without angina pectoris: Secondary | ICD-10-CM | POA: Diagnosis present

## 2012-09-16 DIAGNOSIS — R627 Adult failure to thrive: Secondary | ICD-10-CM

## 2012-09-16 DIAGNOSIS — D72819 Decreased white blood cell count, unspecified: Secondary | ICD-10-CM | POA: Diagnosis present

## 2012-09-16 DIAGNOSIS — IMO0002 Reserved for concepts with insufficient information to code with codable children: Secondary | ICD-10-CM

## 2012-09-16 DIAGNOSIS — E119 Type 2 diabetes mellitus without complications: Secondary | ICD-10-CM | POA: Diagnosis present

## 2012-09-16 LAB — COMPREHENSIVE METABOLIC PANEL
ALT: 17 U/L (ref 0–35)
AST: 33 U/L (ref 0–37)
Alkaline Phosphatase: 65 U/L (ref 39–117)
CO2: 27 mEq/L (ref 19–32)
Calcium: 9.5 mg/dL (ref 8.4–10.5)
GFR calc Af Amer: 50 mL/min — ABNORMAL LOW (ref >90)
GFR calc non Af Amer: 43 mL/min — ABNORMAL LOW (ref >90)
Glucose, Bld: 120 mg/dL — ABNORMAL HIGH (ref 70–99)
Potassium: 4.5 mEq/L (ref 3.5–5.1)
Sodium: 142 mEq/L (ref 135–145)
Total Protein: 6.7 g/dL (ref 6.0–8.3)

## 2012-09-16 LAB — CBC
HCT: 41 % (ref 36.0–46.0)
Hemoglobin: 13.2 g/dL (ref 12.0–15.0)
MCH: 28.9 pg (ref 26.0–34.0)
MCHC: 32.2 g/dL (ref 30.0–36.0)
RDW: 15.3 % (ref 11.5–15.5)

## 2012-09-16 LAB — POCT I-STAT, CHEM 8
BUN: 18 mg/dL (ref 6–23)
Calcium, Ion: 1.2 mmol/L (ref 1.13–1.30)
Creatinine, Ser: 1.2 mg/dL — ABNORMAL HIGH (ref 0.50–1.10)
Glucose, Bld: 112 mg/dL — ABNORMAL HIGH (ref 70–99)
Hemoglobin: 13.6 g/dL (ref 12.0–15.0)
TCO2: 26 mmol/L (ref 0–100)

## 2012-09-16 LAB — LIPID PANEL
Cholesterol: 116 mg/dL (ref 0–200)
HDL: 59 mg/dL (ref >39)
Triglycerides: 57 mg/dL (ref <150)

## 2012-09-16 LAB — TROPONIN I: Troponin I: <0.3 ng/mL (ref <0.30)

## 2012-09-16 LAB — URINALYSIS, ROUTINE W REFLEX MICROSCOPIC
Glucose, UA: NEGATIVE mg/dL
Hgb urine dipstick: NEGATIVE
Ketones, ur: NEGATIVE mg/dL
Protein, ur: NEGATIVE mg/dL
Urobilinogen, UA: 0.2 mg/dL (ref 0.0–1.0)

## 2012-09-16 LAB — PLATELET COUNT: Platelets: 59 10*3/uL — ABNORMAL LOW (ref 150–400)

## 2012-09-16 LAB — POCT I-STAT TROPONIN I: Troponin i, poc: 0.04 ng/mL (ref 0.00–0.08)

## 2012-09-16 LAB — TSH: TSH: 0.882 u[IU]/mL (ref 0.350–4.500)

## 2012-09-16 LAB — PRO B NATRIURETIC PEPTIDE: Pro B Natriuretic peptide (BNP): 3786 pg/mL — ABNORMAL HIGH (ref 0–450)

## 2012-09-16 LAB — APTT: aPTT: 39 seconds — ABNORMAL HIGH (ref 24–37)

## 2012-09-16 MED ORDER — POTASSIUM CHLORIDE CRYS ER 20 MEQ PO TBCR
20.0000 meq | EXTENDED_RELEASE_TABLET | Freq: Every morning | ORAL | Status: DC
  Administered 2012-09-16 – 2012-09-19 (×4): 20 meq via ORAL
  Filled 2012-09-16 (×4): qty 1

## 2012-09-16 MED ORDER — SODIUM CHLORIDE 0.9 % IJ SOLN
3.0000 mL | Freq: Two times a day (BID) | INTRAMUSCULAR | Status: DC
  Administered 2012-09-16 – 2012-09-18 (×4): 3 mL via INTRAVENOUS

## 2012-09-16 MED ORDER — NITROGLYCERIN 0.4 MG SL SUBL: 0.4000 mg | SUBLINGUAL_TABLET | SUBLINGUAL | Status: DC | PRN

## 2012-09-16 MED ORDER — ACETAMINOPHEN 650 MG RE SUPP: 650.0000 mg | Freq: Four times a day (QID) | RECTAL | Status: DC | PRN

## 2012-09-16 MED ORDER — DEXTROSE 5 % IV SOLN
500.0000 mg | Freq: Once | INTRAVENOUS | Status: AC
  Administered 2012-09-16: 500 mg via INTRAVENOUS
  Filled 2012-09-16: qty 500

## 2012-09-16 MED ORDER — AMIODARONE HCL 200 MG PO TABS
400.0000 mg | ORAL_TABLET | Freq: Two times a day (BID) | ORAL | Status: DC
  Administered 2012-09-16 – 2012-09-18 (×4): 400 mg via ORAL
  Filled 2012-09-16 (×4): qty 2

## 2012-09-16 MED ORDER — ONDANSETRON HCL 4 MG/2ML IJ SOLN
4.0000 mg | Freq: Once | INTRAMUSCULAR | Status: AC
  Administered 2012-09-16: 4 mg via INTRAVENOUS
  Filled 2012-09-16: qty 2

## 2012-09-16 MED ORDER — SODIUM CHLORIDE 0.9 % IV BOLUS (SEPSIS)
500.0000 mL | Freq: Once | INTRAVENOUS | Status: AC
  Administered 2012-09-16: 500 mL via INTRAVENOUS

## 2012-09-16 MED ORDER — DARIFENACIN HYDROBROMIDE ER 7.5 MG PO TB24
7.5000 mg | ORAL_TABLET | Freq: Every day | ORAL | Status: DC
  Administered 2012-09-16 – 2012-09-19 (×4): 7.5 mg via ORAL
  Filled 2012-09-16 (×7): qty 1

## 2012-09-16 MED ORDER — BISACODYL 10 MG RE SUPP
10.0000 mg | Freq: Every day | RECTAL | Status: DC | PRN
  Administered 2012-09-18: 10 mg via RECTAL
  Filled 2012-09-16: qty 1

## 2012-09-16 MED ORDER — BIOTENE DRY MOUTH MT LIQD
15.0000 mL | Freq: Two times a day (BID) | OROMUCOSAL | Status: DC
  Administered 2012-09-16 – 2012-09-20 (×8): 15 mL via OROMUCOSAL

## 2012-09-16 MED ORDER — MORPHINE SULFATE 2 MG/ML IJ SOLN
2.0000 mg | Freq: Once | INTRAMUSCULAR | Status: AC
  Administered 2012-09-16: 2 mg via INTRAVENOUS
  Filled 2012-09-16: qty 1

## 2012-09-16 MED ORDER — DOCUSATE SODIUM 100 MG PO CAPS
100.0000 mg | ORAL_CAPSULE | Freq: Two times a day (BID) | ORAL | Status: DC
  Administered 2012-09-16 – 2012-09-18 (×6): 100 mg via ORAL
  Filled 2012-09-16 (×7): qty 1

## 2012-09-16 MED ORDER — DEXTROSE 5 % IV SOLN
500.0000 mg | INTRAVENOUS | Status: DC
  Administered 2012-09-17 – 2012-09-19 (×3): 500 mg via INTRAVENOUS
  Filled 2012-09-16 (×4): qty 500

## 2012-09-16 MED ORDER — ALUM & MAG HYDROXIDE-SIMETH 200-200-20 MG/5ML PO SUSP: 30.0000 mL | Freq: Four times a day (QID) | ORAL | Status: DC | PRN

## 2012-09-16 MED ORDER — ONDANSETRON HCL 4 MG/2ML IJ SOLN: 4.0000 mg | Freq: Four times a day (QID) | INTRAMUSCULAR | Status: DC | PRN

## 2012-09-16 MED ORDER — ASPIRIN EC 81 MG PO TBEC
81.0000 mg | DELAYED_RELEASE_TABLET | Freq: Every morning | ORAL | Status: DC
  Administered 2012-09-16 – 2012-09-19 (×4): 81 mg via ORAL
  Filled 2012-09-16 (×4): qty 1

## 2012-09-16 MED ORDER — ENOXAPARIN SODIUM 40 MG/0.4ML ~~LOC~~ SOLN
40.0000 mg | SUBCUTANEOUS | Status: DC
  Administered 2012-09-16: 40 mg via SUBCUTANEOUS
  Filled 2012-09-16 (×2): qty 0.4

## 2012-09-16 MED ORDER — SODIUM CHLORIDE 0.9 % IV SOLN
INTRAVENOUS | Status: DC
  Administered 2012-09-16 (×2): via INTRAVENOUS

## 2012-09-16 MED ORDER — PANTOPRAZOLE SODIUM 40 MG PO TBEC
40.0000 mg | DELAYED_RELEASE_TABLET | Freq: Every morning | ORAL | Status: DC
  Administered 2012-09-16 – 2012-09-19 (×4): 40 mg via ORAL
  Filled 2012-09-16 (×4): qty 1

## 2012-09-16 MED ORDER — SODIUM CHLORIDE 0.9 % IV SOLN
INTRAVENOUS | Status: DC
  Administered 2012-09-16: 1000 mL via INTRAVENOUS

## 2012-09-16 MED ORDER — ISOSORBIDE MONONITRATE ER 60 MG PO TB24
60.0000 mg | ORAL_TABLET | Freq: Every morning | ORAL | Status: DC
  Administered 2012-09-16 – 2012-09-19 (×4): 60 mg via ORAL
  Filled 2012-09-16 (×4): qty 1

## 2012-09-16 MED ORDER — ACETAMINOPHEN 325 MG PO TABS: 650.0000 mg | ORAL_TABLET | Freq: Four times a day (QID) | ORAL | Status: DC | PRN

## 2012-09-16 MED ORDER — SIMVASTATIN 20 MG PO TABS
20.0000 mg | ORAL_TABLET | Freq: Every day | ORAL | Status: DC
  Administered 2012-09-16 – 2012-09-19 (×4): 20 mg via ORAL
  Filled 2012-09-16 (×4): qty 1

## 2012-09-16 MED ORDER — ONDANSETRON HCL 4 MG PO TABS: 4.0000 mg | ORAL_TABLET | Freq: Four times a day (QID) | ORAL | Status: DC | PRN

## 2012-09-16 MED ORDER — HYDROCODONE-ACETAMINOPHEN 5-325 MG PO TABS
1.0000 | ORAL_TABLET | ORAL | Status: DC | PRN
  Administered 2012-09-18: 2 via ORAL
  Filled 2012-09-16 (×2): qty 2

## 2012-09-16 MED ORDER — IBUPROFEN 200 MG PO TABS
200.0000 mg | ORAL_TABLET | Freq: Four times a day (QID) | ORAL | Status: DC | PRN
  Filled 2012-09-16: qty 1

## 2012-09-16 MED ORDER — AMLODIPINE BESYLATE 5 MG PO TABS
2.5000 mg | ORAL_TABLET | Freq: Every morning | ORAL | Status: DC
  Administered 2012-09-16 – 2012-09-19 (×4): 2.5 mg via ORAL
  Filled 2012-09-16 (×4): qty 1

## 2012-09-16 MED ORDER — LIDOCAINE 5 % EX PTCH: 1.0000 | MEDICATED_PATCH | CUTANEOUS | Status: DC

## 2012-09-16 NOTE — Progress Notes (Signed)
UR Chart Review Completed  

## 2012-09-16 NOTE — H&P (Signed)
[facility] History and Physical  [name] ZOX:[id number] DOB: [birth year] DOA: 09/16/2012  Referring physician:  PCP: Cassell Smiles., MD  Specialists:   Chief Complaint: weakness, cough  HPI: [name] is a 79 y.o. female with past medical hx of CAD, CHF, aflutter, V-tach, ITP, DM diet controlled, TIA, dementia, CKD presents to ED cc cough and weakness. Information obtained from daughters at the bedside and are caregivers. 3 weeks ago developed dry cough. The cough has worsened to to productive thick yellow sputum and pt is more short of breath with exertion. Mucinex reportedly does not help. No report of fever, chills, nausea/vomiting, diarrhea. In addition, yesterday pt complained of knee pain and was weaker than normal. Pt given NTG. No complaints of CP, palpitation, HA. Her baseline functional status is wheelchair bound but ambulates to BR with walker. Can make needs know most of time. No reported change in appetite or weight loss. No reports of dysuria, hematuria, melena. Symptoms came on gradually have persisted and worsened. Work up in ED yields white count 2.8, chest xray with mild bibasilar atelectasis, no cardiomegaly, creatinine 1.11, troponin 0.04, temp 100.4. In addition pt had 24 beat run V-tach.  Concern for worsening HF and bronchitis. TRH asked to admit   Review of Systems: The patient denies anorexia, fever, weight loss,, vision loss, decreased hearing, hoarseness, chest pain, syncope,  hemoptysis, abdominal pain, melena, hematochezia, severe indigestion/heartburn, hematuria, incontinence, genital sores,  suspicious skin lesions, transient blindness,  depression, unusual weight change, abnormal bleeding, enlarged lymph nodes, angioedema, and breast masses.    Past Medical History  Diagnosis Date  . Coronary artery disease   . Ventricular tachycardia   . CHF (congestive heart failure)   . GERD (gastroesophageal reflux disease)   . Atrial flutter   . Sick  sinus syndrome   . Dementia   . Diabetes mellitus   . Thrombocytopenia   . Right bundle branch block   . Hyperlipidemia   . TIA (transient ischemic attack)   . Arthritis   . ITP (idiopathic thrombocytopenic purpura) 07/14/2011    Chronic low grade idiopathic thrombocytopenia purpura still not in need of therapy.   Marland Kitchen URI (upper respiratory infection)     history  . Hx: UTI (urinary tract infection)   . Chronic kidney disease   . Pneumonia    Past Surgical History  Procedure Laterality Date  . Coronary angioplasty with stent placement    . Biopsy thyroid     Social History:  reports that she has never smoked. She has never used smokeless tobacco. She reports that she does not drink alcohol or use illicit drugs. Lives with daughter, is wheelchair bound, moderate dementia, able to make needs known.   No Known Allergies  No family history on file. Mother deceased CAD MI Father deceased health hx unknown  Prior to Admission medications   Medication Sig Start Date End Date Taking? Authorizing Provider  amLODipine (NORVASC) 2.5 MG tablet Take 2.5 mg by mouth every morning.    Yes Historical Provider, MD  aspirin 81 MG tablet Take 81 mg by mouth every morning.    Yes Historical Provider, MD  guaiFENesin (MUCINEX) 600 MG 12 hr tablet Take 600 mg by mouth every morning.     Yes Historical Provider, MD  Ibuprofen (ADVIL) 200 MG CAPS Take 1 capsule by mouth as needed.   Yes Historical Provider, MD  isosorbide mononitrate (IMDUR) 60 MG 24 hr tablet Take 60 mg by mouth every morning.  Yes Historical Provider, MD  lisinopril (PRINIVIL,ZESTRIL) 10 MG tablet Take 10 mg by mouth every morning.    Yes Historical Provider, MD  Multiple Vitamins-Iron (MULTIVITAMIN/IRON) TABS Take 1 tablet by mouth every morning.     Yes Historical Provider, MD  nitroGLYCERIN (NITROSTAT) 0.4 MG SL tablet Place 0.4 mg under the tongue every 5 (five) minutes as needed.   Yes Historical Provider, MD  OXYGEN-HELIUM IN  Inhale into the lungs at bedtime.   Yes Historical Provider, MD  pantoprazole (PROTONIX) 40 MG tablet Take 40 mg by mouth every morning.    Yes Historical Provider, MD  potassium chloride SA (K-DUR,KLOR-CON) 20 MEQ tablet Take 20 mEq by mouth every morning.    Yes Historical Provider, MD  simvastatin (ZOCOR) 20 MG tablet Take 20 mg by mouth at bedtime.    Yes Historical Provider, MD  solifenacin (VESICARE) 5 MG tablet Take 5 mg by mouth every morning.    Yes Historical Provider, MD  furosemide (LASIX) 20 MG tablet Take 20 mg by mouth as needed.    Historical Provider, MD  lidocaine (LIDODERM) 5 % Place 1 patch onto the skin daily. Remove & Discard patch within 12 hours or as directed by MD 09/16/12   Sunnie Nielsen, MD  nitroGLYCERIN (NITROLINGUAL) 0.4 MG/SPRAY spray Place 2 sprays under the tongue every 5 (five) minutes as needed.     Historical Provider, MD   Physical Exam: Filed Vitals:   09/16/12 1000 09/16/12 1007 09/16/12 1100 09/16/12 1200  BP: 117/47  101/44 99/49  Pulse: 57  63 59  Temp:  98.8 F (37.1 C)    TempSrc:  Oral    Resp: 20     SpO2: 99%  92% 92%     General:  Thin frail NAD  Eyes: PERRL EOMI  ENT: nose without drainage, ears clear mucus membrane mouth pink slightly dry   Neck: supple no lymphadenopathy  Cardiovascular: regularly irregular, No MGR LE with chronic venous stasis changes. Trace LE edema left >right  Respiratory: normal effort BS distant but clear no rhonchi, no wheeze no crackles  Abdomen: soft +BS non-tender to palpation no mass organomegaly  Skin: warm dry no rash no lesions  Musculoskeletal: no clubbing no cyanosis  Psychiatric: calm cooperative  Neurologic: speech slow, clear facial symmetry. Attempts to follow commands.   Labs on Admission:  Basic Metabolic Panel:  Recent Labs Lab 09/16/12 0519 09/16/12 0548  NA 142 142  K 4.5 4.6  CL 105 108  CO2 27  --   GLUCOSE 120* 112*  BUN 16 18  CREATININE 1.11* 1.20*  CALCIUM 9.5  --     Liver Function Tests:  Recent Labs Lab 09/16/12 0519  AST 33  ALT 17  ALKPHOS 65  BILITOT 0.4  PROT 6.7  ALBUMIN 3.7   No results found for this basename: LIPASE, AMYLASE,  in the last 168 hours No results found for this basename: AMMONIA,  in the last 168 hours CBC:  Recent Labs Lab 09/16/12 0519 09/16/12 0548  WBC 4.7  --   HGB 13.2 13.6  HCT 41.0 40.0  MCV 89.7  --   PLT 56*  --    Cardiac Enzymes: No results found for this basename: CKTOTAL, CKMB, CKMBINDEX, TROPONINI,  in the last 168 hours  BNP (last 3 results) No results found for this basename: PROBNP,  in the last 8760 hours CBG: No results found for this basename: GLUCAP,  in the last 168 hours  Radiological Exams  on Admission: Dg Chest 1 View  09/16/2012   *RADIOLOGY REPORT*  Clinical Data: Status post fall; concern for chest injury.  CHEST - 1 VIEW  Comparison: Chest radiograph performed 07/04/2012  Findings: The lungs are well-aerated.  Pulmonary vascularity is at the upper limits of normal.  Mild basilar atelectasis is noted. There is no evidence of pleural effusion or pneumothorax.  The cardiomediastinal silhouette is enlarged; calcification is noted in the aortic arch.  No acute osseous abnormalities are seen.  IMPRESSION: Mild bibasilar atelectasis noted; lungs otherwise clear. Cardiomegaly noted.  No displaced rib fractures seen.   Original Report Authenticated By: Tonia Ghent, M.D.   Dg Knee 1-2 Views Right  09/16/2012   *RADIOLOGY REPORT*  Clinical Data: Status post fall; right knee pain.  RIGHT KNEE - 1-2 VIEW  Comparison: Right knee radiographs performed 10/05/2011  Findings: There is no evidence of fracture or dislocation.  The joint spaces are preserved.  No significant degenerative change is seen; the patellofemoral joint is grossly unremarkable in appearance.  An enthesophyte is noted at the superior pole of the patella.  A small knee joint effusion is seen.  Scattered vascular calcifications  are seen.  The visualized soft tissues are otherwise unremarkable in appearance.  IMPRESSION:  1. No evidence of fracture or dislocation. 2.  Small knee joint effusion noted. 3.  Scattered vascular calcifications seen.   Original Report Authenticated By: Tonia Ghent, M.D.    EKG: Independently reviewed. afib with pacer  Assessment/Plan Principal Problem:   NSVT: will admit for observation to tele. Chart review indicates pt with hx of same as well as RBBB. 24 beat run in ED. Will cycle CE, get echo and repeat EKG in am. Will provide supportive care in form of oxygen as needed, NTG and morphine as needed. Will check TSH and lipid panel as pt with hx CAD and aflutter.  Active Problems: Cough: likely bronchitis. Will continue azithromycin. Maintaining sats without problem. Hold ACE inhibitor.   Chronic kidney disease: stage I. Creatinine 1.2 slightly above baseline. Likely related to decreased po intake. Will hold nephrotoxins and recheck in am   Coronary artery disease: no complaints CP. Will continue home meds. Check lipid panel.    CHF (congestive heart failure): last known echo 2008 with EF 40%. Does not appear fluid overloaded. Will check echo. Will get proBNP. Continue home meds     GERD (gastroesophageal reflux disease): at baseline     Dementia: appears at baseline.     Diabetes: diet controlled. Will check HA1C    Venous insufficiency: stable at baseline.     Anemia: mild. Likely chronic disease. No s/sx active bleeding    ITP (idiopathic thrombocytopenic purpura): chronic and stable at baseline.          Code Status: DNR Family Communication: daughters at baseline Disposition Plan: home when ready  Time spent: 60 minutes  Gwenyth Bender [facility] Pager [phone]  If 7PM-7AM, please contact night-coverage [email or web address] Password [facility] 09/16/2012, 12:25 PM  Attending note:  Patient seen and independently examined. Above note reviewed.  Patient was noted  to have a cough on admission. Chest x-ray did not show any clear pneumonia. She did have a low-grade temperature in the emergency room. This is likely related to bronchitis. She's been started on azithromycin will be continued on the same.  She was also noted to have a significant episode of nonsustained ventricular tachycardia. Review of records indicates that she has a history of the same. She is followed  by Dr. Tresa End[facility]. 2-D echocardiogram has been done with report pending. Potassium is in normal range, we'll check magnesium. Patient's heart rate is in the 50s to 60s. This will preclude addition of beta blocker.  we will start the patient on oral amiodarone. Patient to followup with her primary cardiologist in the outpatient setting to further address this. We will cycle cardiac markers overnight. She does not have any chest pain at this time.  The patient's workup is otherwise unremarkable, she can possibly discharge home in the next 24 hours with home health services.   Vihaan Gloss

## 2012-09-16 NOTE — ED Notes (Signed)
Pt had a run of VT on monitor lasting approx 4-5 secs, converting back to SB.  Strip ran and shown to edp.  nad noted at this time.

## 2012-09-16 NOTE — ED Notes (Signed)
Upon discharge, pt's family requesting pt be admitted for observation.  edp notified of family's request.

## 2012-09-16 NOTE — Progress Notes (Signed)
*  PRELIMINARY RESULTS* Echocardiogram 2D Echocardiogram has been performed.  Conrad [employer] 09/16/2012, 3:44 PM

## 2012-09-16 NOTE — ED Provider Notes (Signed)
History     CSN: [id number]  Arrival date & time 09/16/12  0502   First MD Initiated Contact with Patient 09/16/12 0510      Chief Complaint  Patient presents with  . Weakness    (Consider location/radiation/quality/duration/timing/severity/associated sxs/prior treatment) HPI Hx provided by the daughter, has dementia, heart disease, lives at home and is wheel chair bound.  She has had a cough for the last 3 weeks, and since last night not taking as much fluids as she normally does and per her daughter seems to be more weak.  Daughter is also concerned about R knee that seems to give her a lot of pain, has baseline swelling, no fall or trauma. PT unable to provide any sig history- level 5 caveat applies.    Past Medical History  Diagnosis Date  . Coronary artery disease   . Ventricular tachycardia   . CHF (congestive heart failure)   . GERD (gastroesophageal reflux disease)   . Atrial flutter   . Sick sinus syndrome   . Dementia   . Diabetes mellitus   . Thrombocytopenia   . Right bundle branch block   . Hyperlipidemia   . TIA (transient ischemic attack)   . Arthritis   . ITP (idiopathic thrombocytopenic purpura) 07/14/2011    Chronic low grade idiopathic thrombocytopenia purpura still not in need of therapy.   Marland Kitchen URI (upper respiratory infection)     history  . Hx: UTI (urinary tract infection)   . Chronic kidney disease   . Pneumonia     Past Surgical History  Procedure Laterality Date  . Coronary angioplasty with stent placement    . Biopsy thyroid      No family history on file.  History  Substance Use Topics  . Smoking status: Never Smoker   . Smokeless tobacco: Never Used  . Alcohol Use: No    OB History   Grav Para Term Preterm Abortions TAB SAB Ect Mult Living                  Review of Systems  Unable to perform ROS level 5 caveat as above - dementia  Allergies  Review of patient's allergies indicates no known allergies.  Home Medications    Current Outpatient Rx  Name  Route  Sig  Dispense  Refill  . amLODipine (NORVASC) 2.5 MG tablet   Oral   Take 2.5 mg by mouth every morning.          Marland Kitchen aspirin 81 MG tablet   Oral   Take 81 mg by mouth every morning.          Marland Kitchen guaiFENesin (MUCINEX) 600 MG 12 hr tablet   Oral   Take 600 mg by mouth every morning.           . Ibuprofen (ADVIL) 200 MG CAPS   Oral   Take 1 capsule by mouth as needed.         . isosorbide mononitrate (IMDUR) 60 MG 24 hr tablet   Oral   Take 60 mg by mouth every morning.          Marland Kitchen lisinopril (PRINIVIL,ZESTRIL) 10 MG tablet   Oral   Take 5 mg by mouth every morning. Takes 1/2 tablet daily         . Multiple Vitamins-Iron (MULTIVITAMIN/IRON) TABS   Oral   Take 1 tablet by mouth every morning.           . nitroGLYCERIN (NITROSTAT)  0.4 MG SL tablet   Sublingual   Place 0.4 mg under the tongue every 5 (five) minutes as needed.         . OXYGEN-HELIUM IN   Inhalation   Inhale into the lungs at bedtime.         . pantoprazole (PROTONIX) 40 MG tablet   Oral   Take 40 mg by mouth every morning.          . potassium chloride SA (K-DUR,KLOR-CON) 20 MEQ tablet   Oral   Take 20 mEq by mouth every morning.          . simvastatin (ZOCOR) 20 MG tablet   Oral   Take 20 mg by mouth at bedtime.          . solifenacin (VESICARE) 5 MG tablet   Oral   Take 5 mg by mouth every morning.          . furosemide (LASIX) 20 MG tablet   Oral   Take 20 mg by mouth as needed.         . nitroGLYCERIN (NITROLINGUAL) 0.4 MG/SPRAY spray   Sublingual   Place 2 sprays under the tongue every 5 (five) minutes as needed.            BP 134/78  Pulse 60  Temp(Src) 99.3 F (37.4 C) (Rectal)  Resp 24  SpO2 100%  Physical Exam  Constitutional: She appears well-developed and well-nourished.  HENT:  Head: Normocephalic and atraumatic.  Eyes: Pupils are equal, round, and reactive to light. No scleral icterus.  Neck: No  tracheal deviation present.  Cardiovascular: Normal rate, regular rhythm and intact distal pulses.   Pulmonary/Chest: Effort normal and breath sounds normal. No stridor. No respiratory distress. She exhibits no tenderness.  Abdominal: Soft. Bowel sounds are normal. She exhibits no distension. There is no tenderness.  Musculoskeletal:  RLE: mild to mod effusion R knee, no erythema or inc warmth, dec ROM 2/2 pain, distal pulses, motor intact  Neurological:  Awake, alert, withdrawls from pain  Skin: Skin is warm and dry.    ED Course  Procedures (including critical care time)  Results for orders placed during the hospital encounter of 09/16/12  COMPREHENSIVE METABOLIC PANEL      Result Value Range   Sodium 142  135 - 145 mEq/L   Potassium 4.5  3.5 - 5.1 mEq/L   Chloride 105  96 - 112 mEq/L   CO2 27  19 - 32 mEq/L   Glucose, Bld 120 (*) 70 - 99 mg/dL   BUN 16  6 - 23 mg/dL   Creatinine, Ser 4.54 (*) 0.50 - 1.10 mg/dL   Calcium 9.5  8.4 - 09.8 mg/dL   Total Protein 6.7  6.0 - 8.3 g/dL   Albumin 3.7  3.5 - 5.2 g/dL   AST 33  0 - 37 U/L   ALT 17  0 - 35 U/L   Alkaline Phosphatase 65  39 - 117 U/L   Total Bilirubin 0.4  0.3 - 1.2 mg/dL   GFR calc non Af Amer 43 (*) >90 mL/min   GFR calc Af Amer 50 (*) >90 mL/min  CBC      Result Value Range   WBC 4.7  4.0 - 10.5 K/uL   RBC 4.57  3.87 - 5.11 MIL/uL   Hemoglobin 13.2  12.0 - 15.0 g/dL   HCT 11.9  14.7 - 82.9 %   MCV 89.7  78.0 - 100.0 fL  MCH 28.9  26.0 - 34.0 pg   MCHC 32.2  30.0 - 36.0 g/dL   RDW 57.8  46.9 - 62.9 %   Platelets 56 (*) 150 - 400 K/uL  URINALYSIS, ROUTINE W REFLEX MICROSCOPIC      Result Value Range   Color, Urine YELLOW  YELLOW   APPearance CLEAR  CLEAR   Specific Gravity, Urine 1.020  1.005 - 1.030   pH 5.5  5.0 - 8.0   Glucose, UA NEGATIVE  NEGATIVE mg/dL   Hgb urine dipstick NEGATIVE  NEGATIVE   Bilirubin Urine NEGATIVE  NEGATIVE   Ketones, ur NEGATIVE  NEGATIVE mg/dL   Protein, ur NEGATIVE  NEGATIVE  mg/dL   Urobilinogen, UA 0.2  0.0 - 1.0 mg/dL   Nitrite NEGATIVE  NEGATIVE   Leukocytes, UA NEGATIVE  NEGATIVE  TROPONIN I      Result Value Range   Troponin I <0.30  <0.30 ng/mL  TROPONIN I      Result Value Range   Troponin I <0.30  <0.30 ng/mL  LIPID PANEL      Result Value Range   Cholesterol 116  0 - 200 mg/dL   Triglycerides 57  <528 mg/dL   HDL 59  >41 mg/dL   Total CHOL/HDL Ratio 2.0     VLDL 11  0 - 40 mg/dL   LDL Cholesterol 46  0 - 99 mg/dL  HEMOGLOBIN L2G      Result Value Range   Hemoglobin A1C 6.0 (*) <5.7 %   Mean Plasma Glucose 126 (*) <117 mg/dL  TSH      Result Value Range   TSH 0.882  0.350 - 4.500 uIU/mL  PRO B NATRIURETIC PEPTIDE      Result Value Range   Pro B Natriuretic peptide (BNP) 3786.0 (*) 0 - 450 pg/mL  MAGNESIUM      Result Value Range   Magnesium 1.7  1.5 - 2.5 mg/dL  APTT      Result Value Range   aPTT 39 (*) 24 - 37 seconds  PROTIME-INR      Result Value Range   Prothrombin Time 14.3  11.6 - 15.2 seconds   INR 1.13  0.00 - 1.49  PLATELET COUNT      Result Value Range   Platelets 59 (*) 150 - 400 K/uL  POCT I-STAT, CHEM 8      Result Value Range   Sodium 142  135 - 145 mEq/L   Potassium 4.6  3.5 - 5.1 mEq/L   Chloride 108  96 - 112 mEq/L   BUN 18  6 - 23 mg/dL   Creatinine, Ser 4.01 (*) 0.50 - 1.10 mg/dL   Glucose, Bld 027 (*) 70 - 99 mg/dL   Calcium, Ion 2.53  6.64 - 1.30 mmol/L   TCO2 26  0 - 100 mmol/L   Hemoglobin 13.6  12.0 - 15.0 g/dL   HCT 40.3  47.4 - 25.9 %  POCT I-STAT TROPONIN I      Result Value Range   Troponin i, poc 0.04  0.00 - 0.08 ng/mL   Comment 3            Dg Chest 1 View  09/16/2012   *RADIOLOGY REPORT*  Clinical Data: Status post fall; concern for chest injury.  CHEST - 1 VIEW  Comparison: Chest radiograph performed 07/04/2012  Findings: The lungs are well-aerated.  Pulmonary vascularity is at the upper limits of normal.  Mild basilar atelectasis is noted. There is  no evidence of pleural effusion or  pneumothorax.  The cardiomediastinal silhouette is enlarged; calcification is noted in the aortic arch.  No acute osseous abnormalities are seen.  IMPRESSION: Mild bibasilar atelectasis noted; lungs otherwise clear. Cardiomegaly noted.  No displaced rib fractures seen.   Original Report Authenticated By: Tonia Ghent, M.D.   Dg Knee 1-2 Views Right  09/16/2012   *RADIOLOGY REPORT*  Clinical Data: Status post fall; right knee pain.  RIGHT KNEE - 1-2 VIEW  Comparison: Right knee radiographs performed 10/05/2011  Findings: There is no evidence of fracture or dislocation.  The joint spaces are preserved.  No significant degenerative change is seen; the patellofemoral joint is grossly unremarkable in appearance.  An enthesophyte is noted at the superior pole of the patella.  A small knee joint effusion is seen.  Scattered vascular calcifications are seen.  The visualized soft tissues are otherwise unremarkable in appearance.  IMPRESSION:  1. No evidence of fracture or dislocation. 2.  Small knee joint effusion noted. 3.  Scattered vascular calcifications seen.   Original Report Authenticated By: Tonia Ghent, M.D.    Oxygen stas on RA 100% is adequate   Date: 09/16/2012  Rate: 73  Rhythm: junctional  QRS Axis: left  Intervals: normal  ST/T Wave abnormalities: nonspecific ST/T changes  Conduction Disutrbances:right bundle branch block  Narrative Interpretation:   Old EKG Reviewed: previous ECG shows junctional with RBBB  Daughter concerned PT is in a lot of pain, no obvious grimacing. She does appear uncomfortable with any manipulation of her R knee - morphine 2mg  provided. Labs and imaging obtained/ reviewed with family bedside, initialy comfortable with plan d/c home.   MDM  Generalized weakness, LGF Labs, imaging, ECG Old records reviewed - has chronic ITP baseline pltls VS and nursing notes reviewed       Sunnie Nielsen, MD 09/16/12 2349

## 2012-09-16 NOTE — ED Notes (Signed)
Family states they gave pt 3 nitro pills for arm pain stating pt normally goes back to sleep after receiving medicaton & did not tonight.

## 2012-09-16 NOTE — ED Provider Notes (Signed)
Patient had 24 beat run of V. Tach        discussed with general medicine. Admit to OBS  Donnetta Hutching, MD 09/16/12 1119

## 2012-09-16 NOTE — ED Notes (Addendum)
Family reports pt became weaker during the night. EMS reports pt warm, house hot & pt under heavy blanket. Family states pt having knee pain, arm pain & headache.

## 2012-09-17 LAB — CBC
MCH: 29.6 pg (ref 26.0–34.0)
MCHC: 32.9 g/dL (ref 30.0–36.0)
MCV: 89.8 fL (ref 78.0–100.0)
Platelets: 53 10*3/uL — ABNORMAL LOW (ref 150–400)
RBC: 4.33 MIL/uL (ref 3.87–5.11)
RDW: 15.4 % (ref 11.5–15.5)

## 2012-09-17 LAB — BASIC METABOLIC PANEL
BUN: 12 mg/dL (ref 6–23)
CO2: 25 mEq/L (ref 19–32)
Calcium: 9 mg/dL (ref 8.4–10.5)
Creatinine, Ser: 1.25 mg/dL — ABNORMAL HIGH (ref 0.50–1.10)
GFR calc non Af Amer: 37 mL/min — ABNORMAL LOW (ref >90)
Glucose, Bld: 114 mg/dL — ABNORMAL HIGH (ref 70–99)
Sodium: 142 mEq/L (ref 135–145)

## 2012-09-17 MED ORDER — GUAIFENESIN ER 600 MG PO TB12
1200.0000 mg | ORAL_TABLET | Freq: Two times a day (BID) | ORAL | Status: DC
  Administered 2012-09-17 – 2012-09-19 (×6): 1200 mg via ORAL
  Filled 2012-09-17 (×6): qty 2

## 2012-09-17 MED ORDER — IPRATROPIUM-ALBUTEROL 20-100 MCG/ACT IN AERS
2.0000 | INHALATION_SPRAY | Freq: Four times a day (QID) | RESPIRATORY_TRACT | Status: DC
  Administered 2012-09-17: 2 via RESPIRATORY_TRACT
  Filled 2012-09-17: qty 4

## 2012-09-17 MED ORDER — ENOXAPARIN SODIUM 30 MG/0.3ML ~~LOC~~ SOLN
30.0000 mg | SUBCUTANEOUS | Status: DC
  Administered 2012-09-17 – 2012-09-19 (×3): 30 mg via SUBCUTANEOUS
  Filled 2012-09-17 (×3): qty 0.3

## 2012-09-17 NOTE — Progress Notes (Signed)
[facility] PROGRESS NOTE  Dawn MAHRT EAV:[id number] DOB: 13-Dec-1921 DOA: 09/16/2012 PCP: Cassell Smiles., MD  Assessment/Plan: 1. Nonsustained ventricular tachycardia. Patient had a 24 beat run of nonsustained ventricular tachycardia in the emergency room. Since that time, she has intermittent bursts of 5-6 beats of NSVT. Patient is completely asymptomatic. 2-D echocardiogram was done which showed the ejection fraction is 45%. Patient's heart rate is running baseline between 50-60s. Case was discussed with Dr. Herbie Baltimore on call for [facility] heart and vascular. It was recommended that patient continue on amiodarone 400 mg by mouth twice a day for today. Tomorrow, she can be changed to amiodarone 200 mg once daily or metoprolol 12.5 mg by mouth twice a day. She will need close followup with Dr. Tresa Endo in the outpatient setting and this will be arranged by the cardiology service. 2. Recurrent cough. Family reports the patient has had a cough for the past 2 months which is progressively gotten worse. This appears to be worse after she eats or drinks. She may have an element of dysphagia and aspiration. Would continue antibiotics for now. Will request a swallow evaluation. She may need to be on modified diet. Chest x-ray from admission does not show any pneumonia  3. Dementia. Appears to be at baseline 4. Coronary artery disease. No complaints of chest pain. Cardiac enzymes are negative. 5. Chronic systolic congestive heart failure. Does not appear to be in volume overload. 6. Chronic thrombocytopenia due to chronic ITP. Stable at baseline  Code Status: DNR Family Communication: discussed with daughters at the bedside Disposition Plan: return home on discharge, she appears to be near her functional baseline   Consultants:  none  Procedures:  none  Antibiotics:  Azithromycin 5/30  HPI/Subjective: Patient's family reports that she continues to cough, seems to be worse after  eating breakfast, denies any chest pain.  Objective: Filed Vitals:   09/16/12 1358 09/16/12 1610 09/16/12 2156 09/17/12 0613  BP: 106/62  90/51 126/67  Pulse:   78 52  Temp: 98.5 F (36.9 C)  97.7 F (36.5 C) 98 F (36.7 C)  TempSrc: Oral  Axillary Oral  Resp: 16  20 20   Height:  5\' 2"  (1.575 m)    Weight: 50.803 kg (112 lb)   50.803 kg (112 lb)  SpO2: 92%  93% 97%   No intake or output data in the 24 hours ending 09/17/12 1046 Filed Weights   09/16/12 1358 09/17/12 0613  Weight: 50.803 kg (112 lb) 50.803 kg (112 lb)    Exam:   General:  NAD  Cardiovascular: S1, S2 bradycardic  Respiratory: rhonchi in upper airways bilaterally  Abdomen: soft, nt, nd, bs+  Musculoskeletal: no pedal edema b/l   Data Reviewed: Basic Metabolic Panel:  Recent Labs Lab 09/16/12 0519 09/16/12 0548 09/17/12 0541  NA 142 142 142  K 4.5 4.6 4.4  CL 105 108 108  CO2 27  --  25  GLUCOSE 120* 112* 114*  BUN 16 18 12   CREATININE 1.11* 1.20* 1.25*  CALCIUM 9.5  --  9.0  MG 1.7  --   --    Liver Function Tests:  Recent Labs Lab 09/16/12 0519  AST 33  ALT 17  ALKPHOS 65  BILITOT 0.4  PROT 6.7  ALBUMIN 3.7   No results found for this basename: LIPASE, AMYLASE,  in the last 168 hours No results found for this basename: AMMONIA,  in the last 168 hours CBC:  Recent Labs Lab 09/16/12 0519 09/16/12 0548 09/16/12  2139 09/17/12 0541  WBC 4.7  --   --  3.8*  HGB 13.2 13.6  --  12.8  HCT 41.0 40.0  --  38.9  MCV 89.7  --   --  89.8  PLT 56*  --  59* 53*   Cardiac Enzymes:  Recent Labs Lab 09/16/12 1342 09/16/12 2139  TROPONINI <0.30 <0.30   BNP (last 3 results)  Recent Labs  09/16/12 1342  PROBNP 3786.0*   CBG: No results found for this basename: GLUCAP,  in the last 168 hours  No results found for this or any previous visit (from the past 240 hour(s)).   Studies: Dg Chest 1 View  09/16/2012   *RADIOLOGY REPORT*  Clinical Data: Status post fall; concern for  chest injury.  CHEST - 1 VIEW  Comparison: Chest radiograph performed 07/04/2012  Findings: The lungs are well-aerated.  Pulmonary vascularity is at the upper limits of normal.  Mild basilar atelectasis is noted. There is no evidence of pleural effusion or pneumothorax.  The cardiomediastinal silhouette is enlarged; calcification is noted in the aortic arch.  No acute osseous abnormalities are seen.  IMPRESSION: Mild bibasilar atelectasis noted; lungs otherwise clear. Cardiomegaly noted.  No displaced rib fractures seen.   Original Report Authenticated By: Tonia Ghent, M.D.   Dg Knee 1-2 Views Right  09/16/2012   *RADIOLOGY REPORT*  Clinical Data: Status post fall; right knee pain.  RIGHT KNEE - 1-2 VIEW  Comparison: Right knee radiographs performed 10/05/2011  Findings: There is no evidence of fracture or dislocation.  The joint spaces are preserved.  No significant degenerative change is seen; the patellofemoral joint is grossly unremarkable in appearance.  An enthesophyte is noted at the superior pole of the patella.  A small knee joint effusion is seen.  Scattered vascular calcifications are seen.  The visualized soft tissues are otherwise unremarkable in appearance.  IMPRESSION:  1. No evidence of fracture or dislocation. 2.  Small knee joint effusion noted. 3.  Scattered vascular calcifications seen.   Original Report Authenticated By: Tonia Ghent, M.D.    Scheduled Meds: . amiodarone  400 mg Oral BID  . amLODipine  2.5 mg Oral q morning - 10a  . antiseptic oral rinse  15 mL Mouth Rinse BID  . aspirin EC  81 mg Oral q morning - 10a  . azithromycin  500 mg Intravenous Q24H  . darifenacin  7.5 mg Oral Daily  . docusate sodium  100 mg Oral BID  . enoxaparin (LOVENOX) injection  40 mg Subcutaneous Q24H  . isosorbide mononitrate  60 mg Oral q morning - 10a  . pantoprazole  40 mg Oral q morning - 10a  . potassium chloride SA  20 mEq Oral q morning - 10a  . simvastatin  20 mg Oral QHS  . sodium  chloride  3 mL Intravenous Q12H   Continuous Infusions: . sodium chloride 75 mL/hr at 09/16/12 2257    Principal Problem:   V tach Active Problems:   Anemia   ITP (idiopathic thrombocytopenic purpura)   Coronary artery disease   CHF (congestive heart failure)   GERD (gastroesophageal reflux disease)   Dementia   Chronic kidney disease   Diabetes   Cough   Venous insufficiency   Leukopenia    Time spent:    [facility]  [facility] Pager [phone]. If 7PM-7AM, please contact night-coverage at [email or web address], password [facility] 09/17/2012, 10:46 AM  LOS: 1 day

## 2012-09-18 LAB — CBC
HCT: 39.2 % (ref 36.0–46.0)
MCH: 29.1 pg (ref 26.0–34.0)
MCV: 89.1 fL (ref 78.0–100.0)
Platelets: 50 10*3/uL — ABNORMAL LOW (ref 150–400)
RDW: 15.4 % (ref 11.5–15.5)
WBC: 8.3 10*3/uL (ref 4.0–10.5)

## 2012-09-18 LAB — BASIC METABOLIC PANEL
CO2: 24 mEq/L (ref 19–32)
Calcium: 9.4 mg/dL (ref 8.4–10.5)
Chloride: 106 mEq/L (ref 96–112)
Creatinine, Ser: 1.13 mg/dL — ABNORMAL HIGH (ref 0.50–1.10)
Glucose, Bld: 123 mg/dL — ABNORMAL HIGH (ref 70–99)

## 2012-09-18 MED ORDER — HALOPERIDOL LACTATE 5 MG/ML IJ SOLN
2.5000 mg | Freq: Four times a day (QID) | INTRAMUSCULAR | Status: DC | PRN
  Administered 2012-09-18: 2.5 mg via INTRAVENOUS
  Administered 2012-09-18: 5 mg via INTRAVENOUS
  Filled 2012-09-18 (×3): qty 1

## 2012-09-18 MED ORDER — AMIODARONE HCL 200 MG PO TABS
200.0000 mg | ORAL_TABLET | Freq: Every day | ORAL | Status: DC
  Administered 2012-09-19: 200 mg via ORAL
  Filled 2012-09-18: qty 1

## 2012-09-18 MED ORDER — IPRATROPIUM-ALBUTEROL 20-100 MCG/ACT IN AERS
2.0000 | INHALATION_SPRAY | Freq: Four times a day (QID) | RESPIRATORY_TRACT | Status: DC | PRN
  Administered 2012-09-20: 2 via RESPIRATORY_TRACT
  Filled 2012-09-18: qty 4

## 2012-09-18 NOTE — Progress Notes (Signed)
[facility] PROGRESS NOTE  [name] ZOX:[id number] DOB: Apr 14, 1922 DOA: 09/16/2012 PCP: Cassell Smiles., MD  Assessment/Plan: 1. Nonsustained ventricular tachycardia. Patient had a 24 beat run of nonsustained ventricular tachycardia in the emergency room. Since that time, she has intermittent bursts of 5-6 beats of NSVT. Patient is completely asymptomatic. 2-D echocardiogram was done which showed the ejection fraction is 45%. Patient's heart rate is running baseline between 50-60s. Case was discussed with Dr. Herbie Baltimore on call for [facility] heart and vascular. It was recommended that patient can be discharged on amiodarone 200 mg once daily. She will need close followup with Dr. Tresa Endo in the outpatient setting and this will be arranged by the cardiology service. Of note, since she has been started on amiodarone, episodes of NSVT have decreased. 2. Recurrent cough. Family reports the patient has had a cough for the past 2 months which is progressively gotten worse. This appears to be worse after she eats or drinks. She may have an element of dysphagia and aspiration. Would continue antibiotics for now. Will request a swallow evaluation. She may need to be on modified diet. Chest x-ray from admission does not show any pneumonia  3. Dementia. Appears to be at baseline 4. Coronary artery disease. No complaints of chest pain. Cardiac enzymes are negative. 5. Chronic systolic congestive heart failure. Does not appear to be in volume overload. 6. Chronic thrombocytopenia due to chronic ITP. Stable at baseline  Code Status: DNR Family Communication: discussed with daughters at the bedside Disposition Plan: return home on discharge, she appears to be near her functional baseline   Consultants:  none  Procedures:  none  Antibiotics:  Azithromycin 5/30  HPI/Subjective: Patient was reportedly agitated overnight. She has received Haldol this morning and is more calm and cooperative.  She continues to have audible upper airway secretions.  Objective: Filed Vitals:   09/17/12 1300 09/17/12 1937 09/17/12 2055 09/18/12 0446  BP: 110/74  129/49 129/61  Pulse: 68  74 75  Temp: 97.9 F (36.6 C)  98.1 F (36.7 C) 97.5 F (36.4 C)  TempSrc: Oral  Oral Oral  Resp: 20  20 20   Height:      Weight:      SpO2: 96% 95% 95% 100%    Intake/Output Summary (Last 24 hours) at 09/18/12 1928 Last data filed at 09/18/12 1800  Gross per 24 hour  Intake    120 ml  Output    150 ml  Net    -30 ml   Filed Weights   09/16/12 1358 09/17/12 0613  Weight: 50.803 kg (112 lb) 50.803 kg (112 lb)    Exam:   General:  NAD  Cardiovascular: S1, S2 bradycardic  Respiratory: rhonchi in upper airways bilaterally  Abdomen: soft, nt, nd, bs+  Musculoskeletal: no pedal edema b/l   Data Reviewed: Basic Metabolic Panel:  Recent Labs Lab 09/16/12 0519 09/16/12 0548 09/17/12 0541 09/18/12 0440  NA 142 142 142 142  K 4.5 4.6 4.4 3.7  CL 105 108 108 106  CO2 27  --  25 24  GLUCOSE 120* 112* 114* 123*  BUN 16 18 12 11   CREATININE 1.11* 1.20* 1.25* 1.13*  CALCIUM 9.5  --  9.0 9.4  MG 1.7  --   --   --    Liver Function Tests:  Recent Labs Lab 09/16/12 0519  AST 33  ALT 17  ALKPHOS 65  BILITOT 0.4  PROT 6.7  ALBUMIN 3.7   No results found for this  basename: LIPASE, AMYLASE,  in the last 168 hours No results found for this basename: AMMONIA,  in the last 168 hours CBC:  Recent Labs Lab 09/16/12 0519 09/16/12 0548 09/16/12 2139 09/17/12 0541 09/18/12 0440  WBC 4.7  --   --  3.8* 8.3  HGB 13.2 13.6  --  12.8 12.8  HCT 41.0 40.0  --  38.9 39.2  MCV 89.7  --   --  89.8 89.1  PLT 56*  --  59* 53* 50*   Cardiac Enzymes:  Recent Labs Lab 09/16/12 1342 09/16/12 2139  TROPONINI <0.30 <0.30   BNP (last 3 results)  Recent Labs  09/16/12 1342  PROBNP 3786.0*   CBG: No results found for this basename: GLUCAP,  in the last 168 hours  No results found for  this or any previous visit (from the past 240 hour(s)).   Studies: No results found.  Scheduled Meds: . [START ON 09/19/2012] amiodarone  200 mg Oral Daily  . amLODipine  2.5 mg Oral q morning - 10a  . antiseptic oral rinse  15 mL Mouth Rinse BID  . aspirin EC  81 mg Oral q morning - 10a  . azithromycin  500 mg Intravenous Q24H  . darifenacin  7.5 mg Oral Daily  . docusate sodium  100 mg Oral BID  . enoxaparin (LOVENOX) injection  30 mg Subcutaneous Q24H  . guaiFENesin  1,200 mg Oral BID  . isosorbide mononitrate  60 mg Oral q morning - 10a  . pantoprazole  40 mg Oral q morning - 10a  . potassium chloride SA  20 mEq Oral q morning - 10a  . simvastatin  20 mg Oral QHS  . sodium chloride  3 mL Intravenous Q12H   Continuous Infusions:    Principal Problem:   V tach Active Problems:   Anemia   ITP (idiopathic thrombocytopenic purpura)   Coronary artery disease   CHF (congestive heart failure)   GERD (gastroesophageal reflux disease)   Dementia   Chronic kidney disease   Diabetes   Cough   Venous insufficiency   Leukopenia    Time spent:    [facility]  [facility] Pager [phone]. If 7PM-7AM, please contact night-coverage at [email or web address], password [facility] 09/18/2012, 7:28 PM  LOS: 2 days

## 2012-09-19 DIAGNOSIS — D693 Immune thrombocytopenic purpura: Secondary | ICD-10-CM

## 2012-09-19 LAB — BASIC METABOLIC PANEL
BUN: 14 mg/dL (ref 6–23)
Calcium: 9.9 mg/dL (ref 8.4–10.5)
GFR calc Af Amer: 47 mL/min — ABNORMAL LOW (ref >90)
GFR calc non Af Amer: 40 mL/min — ABNORMAL LOW (ref >90)
Glucose, Bld: 114 mg/dL — ABNORMAL HIGH (ref 70–99)
Potassium: 4.4 mEq/L (ref 3.5–5.1)
Sodium: 142 mEq/L (ref 135–145)

## 2012-09-19 LAB — CBC
HCT: 39.7 % (ref 36.0–46.0)
Hemoglobin: 13.2 g/dL (ref 12.0–15.0)
MCH: 29.3 pg (ref 26.0–34.0)
MCHC: 33.2 g/dL (ref 30.0–36.0)
RDW: 15.4 % (ref 11.5–15.5)

## 2012-09-19 MED ORDER — AZITHROMYCIN 250 MG PO TABS: 500.0000 mg | ORAL_TABLET | Freq: Every day | ORAL | Status: DC

## 2012-09-19 NOTE — Evaluation (Signed)
Clinical/Bedside Swallow Evaluation  Patient Details  Name: [name] MRN: [id number] Date of Birth: 12-24-1921  Today's Date: 09/19/2012 Time: 4098-1191 SLP Time Calculation (min): 22 min  Past Medical History:  Past Medical History  Diagnosis Date  . Coronary artery disease   . Ventricular tachycardia   . CHF (congestive heart failure)   . GERD (gastroesophageal reflux disease)   . Atrial flutter   . Sick sinus syndrome   . Dementia   . Diabetes mellitus   . Thrombocytopenia   . Right bundle branch block   . Hyperlipidemia   . TIA (transient ischemic attack)   . Arthritis   . ITP (idiopathic thrombocytopenic purpura) 07/14/2011    Chronic low grade idiopathic thrombocytopenia purpura still not in need of therapy.   Marland Kitchen URI (upper respiratory infection)     history  . Hx: UTI (urinary tract infection)   . Chronic kidney disease   . Pneumonia    Past Surgical History:  Past Surgical History  Procedure Laterality Date  . Coronary angioplasty with stent placement    . Biopsy thyroid     HPI:  [name] is a 79 y.o. female with past medical hx of CAD, CHF, aflutter, V-tach, ITP, DM diet controlled, TIA, dementia, CKD presents to ED cc cough and weakness. Her daughter reports cough x 2 months that is possible worse after eating and drinking. Dtr is at bedside and tells SLP that pt feeds herself up in chair at home and tolerates soft diet well.    Assessment / Plan / Recommendation Clinical Impression  Pt consumed trials po very slowly and had her eyes closed for most of my visit, talking was minimal from pt. She tolerated thin via straw and cup sip without incident as well as puree. She took very small bites of her sugar cookie and demonstrated slow, labored oral prep and delayed cough after swallow on 2/3 trials. She had no difficulty when small bite of cookie presented in apple sauce. Recommend downgrade diet to D2 and continue thin liquids with standard aspiration  precautions. Crush pills in puree as able.  Pt will likely need feeder assist. SLP can complete objective study if necessary, however suspect pt cooperation would be limited.    Aspiration Risk  Mild    Diet Recommendation Dysphagia 2 (Fine chop);Thin liquid   Liquid Administration via: Cup;Straw Medication Administration: Crushed with puree Supervision: Patient able to self feed (feeder assist) Compensations: Slow rate;Small sips/bites;Check for pocketing;Follow solids with liquid Postural Changes and/or Swallow Maneuvers: Out of bed for meals;Seated upright 90 degrees;Upright 30-60 min after meal    Other  Recommendations Oral Care Recommendations: Oral care BID Other Recommendations: Clarify dietary restrictions   Follow Up Recommendations  None    Frequency and Duration min 1 x/week  1 week       SLP Swallow Goals Patient will consume recommended diet without observed clinical signs of aspiration with: Minimal assistance   Swallow Study Prior Functional Status   Pt lives at home with daughter and consumed a soft diet.    General Date of Onset: 09/17/12 HPI: [name] is a 79 y.o. female with past medical hx of CAD, CHF, aflutter, V-tach, ITP, DM diet controlled, TIA, dementia, CKD presents to ED cc cough and weakness. Her daughter reports cough x 2 months that is possible worse after eating and drinking. Dtr is at bedside and tells SLP that pt feeds herself up in chair at home and tolerates soft  diet well.  Type of Study: Bedside swallow evaluation Diet Prior to this Study: Regular Temperature Spikes Noted: No Respiratory Status: Room air History of Recent Intubation: No Behavior/Cognition: Cooperative Oral Cavity - Dentition: Dentures, top;Dentures, bottom Self-Feeding Abilities: Needs assist Patient Positioning: Upright in chair Baseline Vocal Quality: Clear;Low vocal intensity Volitional Cough: Weak Volitional Swallow: Unable to elicit    Oral/Motor/Sensory  Function Overall Oral Motor/Sensory Function: Impaired Labial Strength: Reduced Lingual Strength: Reduced Mandible: Impaired   Ice Chips Ice chips: Within functional limits Presentation: Spoon   Thin Liquid Thin Liquid: Within functional limits Presentation: Cup;Straw    Nectar Thick Nectar Thick Liquid: Not tested   Honey Thick Honey Thick Liquid: Not tested   Puree Puree: Within functional limits Presentation: Spoon Other Comments: Pt only opens her mouth a very small amount and therefore takes very small bites.   Solid   GO   Thank you,  Havery Moros, CCC-SLP [phone]   Solid: Impaired Presentation: Spoon Oral Phase Impairments: Reduced lingual movement/coordination Pharyngeal Phase Impairments: Cough - Delayed       PORTER,DABNEY 09/19/2012,2:26 PM

## 2012-09-19 NOTE — Care Management Note (Signed)
    Page 1 of 2   09/20/2012     11:00:09 AM   CARE MANAGEMENT NOTE 09/20/2012  Patient:  [name], [name]   Account Number:  [id number]  Date Initiated:  09/19/2012  Documentation initiated by:  Sharrie Rothman  Subjective/Objective Assessment:   Pt admitted from home with weaknesss. Pt lives with her daughter, Tyrell Antonio, and another daughter, Wynona Canes is very active in the care of the pt. Pt requires moderate assistance with ADLs'.     Action/Plan:   Family has agreed to [facility] RN, PT, and aide with [facility]. Pt potential discharge 09/20/12. Will notify Alroy Bailiff of new referral.   Anticipated DC Date:  09/21/2012   Anticipated DC Plan:  HOME W HOME HEALTH SERVICES      DC Planning Services  CM consult      [facility] Choice  HOME HEALTH   Choice offered to / List presented to:  C-4 Adult Children        HH arranged  HH-1 RN  HH-2 PT  HH-4 NURSE'S AIDE      HH agency  [facility]   Status of service:  Completed, signed off Medicare Important Message given?  YES (If response is "NO", the following Medicare IM given date fields will be blank) Date Medicare IM given:  09/20/2012 Date Additional Medicare IM given:    Discharge Disposition:  HOME W HOME HEALTH SERVICES  Per UR Regulation:    If discussed at Long Length of Stay Meetings, dates discussed:    Comments:  09/20/12 1100 Arlyss Queen, RN BSN CM Pt discharged home today with [facility]. Alroy Bailiff of [facility] is aware and will collect the pts information from the chart. HH services will start within 48 hours. Pts daughter and pts nurse aware of discharge arrangements. 09/19/12 1505 Arlyss Queen, RN BSN CM

## 2012-09-19 NOTE — Progress Notes (Signed)
  [name] ZOX:[id number] DOB: 1921/09/25 DOA: 09/16/2012 PCP: Cassell Smiles., MD   Subjective: Overall, this lady appears to be better but still continues to have a cough. We are waiting swallowing evaluation.           Physical Exam: Blood pressure 148/64, pulse 78, temperature 97.4 F (36.3 C), temperature source Oral, resp. rate 20, height 5\' 2"  (1.575 m), weight 50.803 kg (112 lb), SpO2 100.00%. Heart sounds are present and normal without any obvious tachycardia on examination. Lung fields are clear except for a few wheezes bilaterally. She is alert.   Investigations:  No results found for this or any previous visit (from the past 240 hour(s)).   Basic Metabolic Panel:  Recent Labs  81/19/14 0440 09/19/12 0601  NA 142 142  K 3.7 4.4  CL 106 107  CO2 24 25  GLUCOSE 123* 114*  BUN 11 14  CREATININE 1.13* 1.16*  CALCIUM 9.4 9.9   Liver Function Tests: No results found for this basename: AST, ALT, ALKPHOS, BILITOT, PROT, ALBUMIN,  in the last 72 hours   CBC:  Recent Labs  09/18/12 0440 09/19/12 0601  WBC 8.3 6.5  HGB 12.8 13.2  HCT 39.2 39.7  MCV 89.1 88.0  PLT 50* 54*    No results found.    Medications: I have reviewed the patient's current medications.  Impression: 1. Nonsustained ventricular tachycardia. Ejection fraction 45%. Amiodarone dose adjusted. 2. Recurrent cough. 3. Moderate dementia. 4. Coronary artery disease, stable. 5. Chronic systolic congestive heart failure, compensated. 6. Chronic thrombocytopenia due to chronic ITP, stable.     Plan: 1. Await swallowing evaluation.  Consultants:  None.   Procedures:  Echocardiogram.   Antibiotics:  Zithromax intravenously started on 09/17/2012.                   Code Status: DO NOT RESUSCITATE.  Family Communication: Discussed plan with patient's daughter at the bedside.   Disposition Plan: Hopefully, home in the next day or so.  Time spent: 20 minutes.   LOS: 3 days   Wilson Singer Pager [phone]  09/19/2012, 12:20 PM

## 2012-09-20 MED ORDER — IPRATROPIUM-ALBUTEROL 20-100 MCG/ACT IN AERS: 2.0000 | INHALATION_SPRAY | Freq: Four times a day (QID) | RESPIRATORY_TRACT | Status: DC | PRN

## 2012-09-20 MED ORDER — AMIODARONE HCL 200 MG PO TABS: 200.0000 mg | ORAL_TABLET | Freq: Every day | ORAL | Status: DC

## 2012-09-20 MED ORDER — AZITHROMYCIN 250 MG PO TABS: 500.0000 mg | ORAL_TABLET | Freq: Every day | ORAL | Status: DC

## 2012-09-20 MED ORDER — HYDROCODONE-ACETAMINOPHEN 5-325 MG PO TABS: 1.0000 | ORAL_TABLET | ORAL | Status: DC | PRN

## 2012-09-20 NOTE — Discharge Summary (Signed)
Physician Discharge Summary  [name] ZOX:[id number] DOB: 04-14-1922 DOA: 09/16/2012  PCP: [name]., MD  Admit date: 09/16/2012 Discharge date: 09/20/2012  Time spent: Greater than 30 minutes  Recommendations for Outpatient Follow-up:  1. Follow up primary care physician as planned. 2. Home health care services.  Discharge Diagnoses:  1. Nonsustained ventricular tachycardia. Ejection fraction 45%. Amiodarone 200 mg daily started. 2. Recurrent cough with risk of aspiration. Chest x-ray does not show any pneumonia. Swallowing evaluation done. Dysphagia 2 with thin liquid recommended. 3. Moderate dementia. 4. Chronic thrombocytopenia secondary to chronic ITP, stable. 5. Coronary artery disease, stable. 6. Chronic systolic congestive heart failure, currently compensated.   Discharge Condition:  Stable.  Diet recommendation: Dysphagia 2 diet with thin liquids.  Filed Weights   09/16/12 1358 09/17/12 0613 09/20/12 0436  Weight: 50.803 kg (112 lb) 50.803 kg (112 lb) 50.803 kg (112 lb)    History of present illness:  This 79 year old lady presented to the hospital with symptoms of cough and weakness. Please see initial history as outlined below: HPI: [name] is a 79 y.o. female with past medical hx of CAD, CHF, aflutter, V-tach, ITP, DM diet controlled, TIA, dementia, CKD presents to ED cc cough and weakness. Information obtained from daughters at the bedside and are caregivers. 3 weeks ago developed dry cough. The cough has worsened to to productive thick yellow sputum and pt is more short of breath with exertion. Mucinex reportedly does not help. No report of fever, chills, nausea/vomiting, diarrhea. In addition, yesterday pt complained of knee pain and was weaker than normal. Pt given NTG. No complaints of CP, palpitation, HA. Her baseline functional status is wheelchair bound but ambulates to BR with walker. Can make needs know most of time. No reported change in  appetite or weight loss. No reports of dysuria, hematuria, melena. Symptoms came on gradually have persisted and worsened. Work up in ED yields white count 2.8, chest xray with mild bibasilar atelectasis, no cardiomegaly, creatinine 1.11, troponin 0.04, temp 100.4. In addition pt had 24 beat run V-tach. Concern for worsening HF and bronchitis. [facility] asked to admit  Hospital Course:  Patient was admitted to the hospital and started on antibiotics. Chest x-ray did not actually show any evidence of pneumonia, there was mild bibasilar atelectasis. During hospitalization, patient had a 24 beat of ventricular tachycardia which was asymptomatic. This was discussed with cardiology who recommended starting on amiodarone. She was evaluated by speech language pathology who recommended a dysphagia 2 diet with thin liquids. Overall, she is improved and feels back to her baseline. I think her moderate dementia or is accounting for meds many of her symptoms related to cough. Her caregivers and family wish to take care of her at home and so we have arranged home health care services also.  Procedures:  Echocardiogram:  Study Conclusions  - Left ventricle: The cavity size was normal. The estimated ejection fraction was 45%. The mid ventricular wall segments appearsignificantly hypertrophied while the apical segments appear thin and aneurysmal. There is an LV mid-cavity gradient. Only 23 mmHg was obtained but I suspect it is truly higher. - Aortic valve: There was no stenosis. - Mitral valve: Mild regurgitation. - Left atrium: The atrium was mildly dilated. - Right ventricle: The cavity size was normal. Systolic function was normal. - Tricuspid valve: Moderate-severe regurgitation. Peak RV-RA gradient: 28mm Hg (S). - Pulmonary arteries: PA peak pressure: 38mm Hg (S). - Systemic veins: IVC was not visualized. Impressions:  - Normal  LV size with EF estimated at 45 mmHg. The basal and mid LV segments appeared  to contract normally. The basal segments were normal in thickness. The mid LV segments were significantly hypertrophied. The apical segments were aneurysmal and thin. There was an LV mid-cavity gradient. We obtained only 23 mmHg on this study but suspect truly higher. Normal RV size and systolic function. Mild pulmonary hypertension.  Consultations:  None. Telephone discussion with cardiology.  Discharge Exam: Filed Vitals:   09/19/12 0426 09/19/12 1400 09/19/12 2138 09/20/12 0436  BP: 148/64 132/72 119/58 125/62  Pulse: 78 74 74 82  Temp: 97.4 F (36.3 C) 97.6 F (36.4 C) 98.1 F (36.7 C) 98 F (36.7 C)  TempSrc: Oral Oral Oral Oral  Resp: 20 20 20 20   Height:      Weight:    50.803 kg (112 lb)  SpO2: 100% 100% 92% 100%    General: She looks chronically sick but currently acutely systemically well. Cardiovascular: Heart sounds are present and appear to be in sinus rhythm without murmurs or added sounds. She is not clinically in heart failure. Respiratory: Lung fields are clear. She is alert and appears to be at her baseline dementia. There are no focal neurological signs.  Discharge Instructions  Discharge Orders   Future Appointments Provider Department Dept Phone   11/14/2012 10:50 AM [facility] [phone]   11/16/2012 11:30 AM [facility] [phone]   02/14/2013 10:30 AM [facility] [phone]   05/17/2013 10:30 AM [facility] [phone]   Future Orders Complete By Expires     Diet - low sodium heart healthy  As directed     Increase activity slowly  As directed         Medication List    TAKE these medications       ADVIL 200 MG Caps  Generic drug:  Ibuprofen  Take 1 capsule by mouth as needed.     amiodarone 200 MG tablet  Commonly known as:  PACERONE  Take 1 tablet (200 mg total) by mouth daily.     amLODipine 2.5 MG tablet   Commonly known as:  NORVASC  Take 2.5 mg by mouth every morning.     aspirin 81 MG tablet  Take 81 mg by mouth every morning.     azithromycin 250 MG tablet  Commonly known as:  ZITHROMAX  Take 2 tablets (500 mg total) by mouth daily.     furosemide 20 MG tablet  Commonly known as:  LASIX  Take 20 mg by mouth as needed.     guaiFENesin 600 MG 12 hr tablet  Commonly known as:  MUCINEX  Take 600 mg by mouth every morning.     HYDROcodone-acetaminophen 5-325 MG per tablet  Commonly known as:  NORCO/VICODIN  Take 1-2 tablets by mouth every 4 (four) hours as needed.     Ipratropium-Albuterol 20-100 MCG/ACT Aers respimat  Commonly known as:  COMBIVENT  Inhale 2 puffs into the lungs every 6 (six) hours as needed for wheezing.     isosorbide mononitrate 60 MG 24 hr tablet  Commonly known as:  IMDUR  Take 60 mg by mouth every morning.     lidocaine 5 %  Commonly known as:  LIDODERM  Place 1 patch onto the skin daily. Remove & Discard patch within 12 hours or as directed by MD     lisinopril 10 MG  tablet  Commonly known as:  PRINIVIL,ZESTRIL  Take 10 mg by mouth every morning.     Multivitamin/Iron Tabs  Take 1 tablet by mouth every morning.     nitroGLYCERIN 0.4 MG/SPRAY spray  Commonly known as:  NITROLINGUAL  Place 2 sprays under the tongue every 5 (five) minutes as needed.     nitroGLYCERIN 0.4 MG SL tablet  Commonly known as:  NITROSTAT  Place 0.4 mg under the tongue every 5 (five) minutes as needed.     OXYGEN-HELIUM IN  Inhale into the lungs at bedtime.     pantoprazole 40 MG tablet  Commonly known as:  PROTONIX  Take 40 mg by mouth every morning.     potassium chloride SA 20 MEQ tablet  Commonly known as:  K-DUR,KLOR-CON  Take 20 mEq by mouth every morning.     simvastatin 20 MG tablet  Commonly known as:  ZOCOR  Take 20 mg by mouth at bedtime.     solifenacin 5 MG tablet  Commonly known as:  VESICARE  Take 5 mg by mouth every morning.       No  Known Allergies     Follow-up Information   Follow up with [name]., MD In 2 days.   Contact information:   [address] [place] [address] [phone]       Follow up with [facility].   Contact information:   [address] [place] [address] [phone]       The results of significant diagnostics from this hospitalization (including imaging, microbiology, ancillary and laboratory) are listed below for reference.    Significant Diagnostic Studies: Dg Chest 1 View  09/16/2012   *RADIOLOGY REPORT*  Clinical Data: Status post fall; concern for chest injury.  CHEST - 1 VIEW  Comparison: Chest radiograph performed 07/04/2012  Findings: The lungs are well-aerated.  Pulmonary vascularity is at the upper limits of normal.  Mild basilar atelectasis is noted. There is no evidence of pleural effusion or pneumothorax.  The cardiomediastinal silhouette is enlarged; calcification is noted in the aortic arch.  No acute osseous abnormalities are seen.  IMPRESSION: Mild bibasilar atelectasis noted; lungs otherwise clear. Cardiomegaly noted.  No displaced rib fractures seen.   Original Report Authenticated By: Tonia Ghent, M.D.   Dg Knee 1-2 Views Right  09/16/2012   *RADIOLOGY REPORT*  Clinical Data: Status post fall; right knee pain.  RIGHT KNEE - 1-2 VIEW  Comparison: Right knee radiographs performed 10/05/2011  Findings: There is no evidence of fracture or dislocation.  The joint spaces are preserved.  No significant degenerative change is seen; the patellofemoral joint is grossly unremarkable in appearance.  An enthesophyte is noted at the superior pole of the patella.  A small knee joint effusion is seen.  Scattered vascular calcifications are seen.  The visualized soft tissues are otherwise unremarkable in appearance.  IMPRESSION:  1. No evidence of fracture or dislocation. 2.  Small knee joint effusion noted. 3.  Scattered vascular calcifications  seen.   Original Report Authenticated By: Tonia Ghent, M.D.        Labs: Basic Metabolic Panel:  Recent Labs Lab 09/16/12 6962 09/16/12 0548 09/17/12 0541 09/18/12 0440 09/19/12 0601  NA 142 142 142 142 142  K 4.5 4.6 4.4 3.7 4.4  CL 105 108 108 106 107  CO2 27  --  25 24 25   GLUCOSE 120* 112* 114* 123* 114*  BUN 16 18 12 11  14  CREATININE 1.11* 1.20* 1.25* 1.13* 1.16*  CALCIUM 9.5  --  9.0 9.4 9.9  MG 1.7  --   --   --   --    Liver Function Tests:  Recent Labs Lab 09/16/12 0519  AST 33  ALT 17  ALKPHOS 65  BILITOT 0.4  PROT 6.7  ALBUMIN 3.7     CBC:  Recent Labs Lab 09/16/12 0519 09/16/12 0548 09/16/12 2139 09/17/12 0541 09/18/12 0440 09/19/12 0601  WBC 4.7  --   --  3.8* 8.3 6.5  HGB 13.2 13.6  --  12.8 12.8 13.2  HCT 41.0 40.0  --  38.9 39.2 39.7  MCV 89.7  --   --  89.8 89.1 88.0  PLT 56*  --  59* 53* 50* 54*   Cardiac Enzymes:  Recent Labs Lab 09/16/12 1342 09/16/12 2139  TROPONINI <0.30 <0.30   BNP: BNP (last 3 results)  Recent Labs  09/16/12 1342  PROBNP 3786.0*         Signed:  Kanton Kamel C  [facility] 09/20/2012, 8:30 AM

## 2012-09-20 NOTE — Progress Notes (Signed)
Discharge instructions given to daughter with all questions answered. Order clarified to d/c lidocaine patch and daughter made aware. Asked MD per daughters request regarding appointment with Dr. Tresa Endo. F/u appointment made with Dr. Tresa Endo and Dr. Sherwood Gambler. [facility] set up. Prescriptions faxed to [facility] per daughters request. Daughter understood that prescription for Vicodin had to be a hard copy given to pharmacy. Asked daughter before patient left the floor if there was anything she needed or anything we could do for her or her mother. Daughter denied needing assistance at this time. Patient taken out of facility by staff via wheelchair.

## 2012-09-23 ENCOUNTER — Emergency Department (HOSPITAL_COMMUNITY)
Admission: EM | Admit: 2012-09-23 | Discharge: 2012-09-23 | Disposition: A | Discharge status: Home / Self Care | Payer: Medicare Other | Attending: Emergency Medicine | Admitting: Emergency Medicine

## 2012-09-23 ENCOUNTER — Encounter (HOSPITAL_COMMUNITY): Payer: Self-pay | Admitting: Emergency Medicine

## 2012-09-23 ENCOUNTER — Emergency Department (HOSPITAL_COMMUNITY): Payer: Medicare Other

## 2012-09-23 DIAGNOSIS — E119 Type 2 diabetes mellitus without complications: Secondary | ICD-10-CM | POA: Insufficient documentation

## 2012-09-23 DIAGNOSIS — Z7982 Long term (current) use of aspirin: Secondary | ICD-10-CM | POA: Insufficient documentation

## 2012-09-23 DIAGNOSIS — I2510 Atherosclerotic heart disease of native coronary artery without angina pectoris: Secondary | ICD-10-CM | POA: Insufficient documentation

## 2012-09-23 DIAGNOSIS — Z9861 Coronary angioplasty status: Secondary | ICD-10-CM | POA: Insufficient documentation

## 2012-09-23 DIAGNOSIS — R5383 Other fatigue: Secondary | ICD-10-CM | POA: Insufficient documentation

## 2012-09-23 DIAGNOSIS — N189 Chronic kidney disease, unspecified: Secondary | ICD-10-CM | POA: Insufficient documentation

## 2012-09-23 DIAGNOSIS — M129 Arthropathy, unspecified: Secondary | ICD-10-CM | POA: Insufficient documentation

## 2012-09-23 DIAGNOSIS — J40 Bronchitis, not specified as acute or chronic: Secondary | ICD-10-CM | POA: Insufficient documentation

## 2012-09-23 DIAGNOSIS — Z8673 Personal history of transient ischemic attack (TIA), and cerebral infarction without residual deficits: Secondary | ICD-10-CM | POA: Insufficient documentation

## 2012-09-23 DIAGNOSIS — Z79899 Other long term (current) drug therapy: Secondary | ICD-10-CM | POA: Insufficient documentation

## 2012-09-23 DIAGNOSIS — R062 Wheezing: Secondary | ICD-10-CM | POA: Insufficient documentation

## 2012-09-23 DIAGNOSIS — F0390 Unspecified dementia without behavioral disturbance: Secondary | ICD-10-CM | POA: Insufficient documentation

## 2012-09-23 DIAGNOSIS — Z862 Personal history of diseases of the blood and blood-forming organs and certain disorders involving the immune mechanism: Secondary | ICD-10-CM | POA: Insufficient documentation

## 2012-09-23 DIAGNOSIS — I509 Heart failure, unspecified: Secondary | ICD-10-CM | POA: Insufficient documentation

## 2012-09-23 DIAGNOSIS — Z87440 Personal history of urinary (tract) infections: Secondary | ICD-10-CM | POA: Insufficient documentation

## 2012-09-23 DIAGNOSIS — E785 Hyperlipidemia, unspecified: Secondary | ICD-10-CM | POA: Insufficient documentation

## 2012-09-23 DIAGNOSIS — J3489 Other specified disorders of nose and nasal sinuses: Secondary | ICD-10-CM | POA: Insufficient documentation

## 2012-09-23 DIAGNOSIS — Z8679 Personal history of other diseases of the circulatory system: Secondary | ICD-10-CM | POA: Insufficient documentation

## 2012-09-23 DIAGNOSIS — R5381 Other malaise: Secondary | ICD-10-CM | POA: Insufficient documentation

## 2012-09-23 DIAGNOSIS — K219 Gastro-esophageal reflux disease without esophagitis: Secondary | ICD-10-CM | POA: Insufficient documentation

## 2012-09-23 DIAGNOSIS — Z8709 Personal history of other diseases of the respiratory system: Secondary | ICD-10-CM | POA: Insufficient documentation

## 2012-09-23 DIAGNOSIS — Z8701 Personal history of pneumonia (recurrent): Secondary | ICD-10-CM | POA: Insufficient documentation

## 2012-09-23 LAB — CBC WITH DIFFERENTIAL/PLATELET
Eosinophils Relative: 0 % (ref 0–5)
HCT: 45.3 % (ref 36.0–46.0)
Hemoglobin: 15.2 g/dL — ABNORMAL HIGH (ref 12.0–15.0)
Lymphocytes Relative: 4 % — ABNORMAL LOW (ref 12–46)
MCHC: 33.6 g/dL (ref 30.0–36.0)
MCV: 88.3 fL (ref 78.0–100.0)
Monocytes Absolute: 0.7 10*3/uL (ref 0.1–1.0)
Monocytes Relative: 9 % (ref 3–12)
Neutro Abs: 7.1 10*3/uL (ref 1.7–7.7)
WBC: 8.2 10*3/uL (ref 4.0–10.5)

## 2012-09-23 LAB — BASIC METABOLIC PANEL
BUN: 18 mg/dL (ref 6–23)
CO2: 24 mEq/L (ref 19–32)
Calcium: 10.4 mg/dL (ref 8.4–10.5)
Chloride: 101 mEq/L (ref 96–112)
Creatinine, Ser: 0.92 mg/dL (ref 0.50–1.10)

## 2012-09-23 NOTE — ED Provider Notes (Signed)
History    This chart was scribed for [name], MD by Toya Smothers, ED Scribe. The patient was seen in room APA01/APA01. Patient's care was started at 1518.  CSN: [id number]  Arrival date & time 09/23/12  1518   First MD Initiated Contact with Patient 09/23/12 1524      Chief Complaint  Patient presents with  . Shortness of Breath    Patient is a 79 y.o. female presenting with shortness of breath. The history is provided by a relative and the patient.  Shortness of Breath Severity:  Mild Onset quality:  Unable to specify Progression:  Unchanged Chronicity:  Recurrent Context: URI   Associated symptoms: wheezing   Associated symptoms: no abdominal pain, no chest pain, no cough, no headaches and no rash     HPI Comments: [name] is a 79 y.o. female with h/o URI and AFIB, brought by family to the Emergency Department to recheck O2 saturation levels. Pt was evaluated by PCP Dr. Sherwood Gambler today and sent to the ED, Per family, "because he was unable to monitor oxygen level and had concerns about dark spots forming on the her arms and legs." Pt was admitted 09/16/2012 for weakness, and d/c 09/20/2012. Weakness has decreased mildly and Pt now endorses progressive congestion. Symptoms have not been treated PTA. Pt denies headache, diaphoresis, fever, chills, nausea, vomiting, diarrhea, weakness, cough, SOB and any other pain. Current medications include nitroglycerine 0.4 mg spray, Lisinopril 10 mg, and Protonix 40 mg. Pt denies use of tobacco, alcohol, and illicit drug use. Pt lives at home with her brother and sister.   Pt was treated with zythromax an  Past Medical History  Diagnosis Date  . Coronary artery disease   . Ventricular tachycardia   . CHF (congestive heart failure)   . GERD (gastroesophageal reflux disease)   . Atrial flutter   . Sick sinus syndrome   . Dementia   . Diabetes mellitus   . Thrombocytopenia   . Right bundle branch block   . Hyperlipidemia   . TIA  (transient ischemic attack)   . Arthritis   . ITP (idiopathic thrombocytopenic purpura) 07/14/2011    Chronic low grade idiopathic thrombocytopenia purpura still not in need of therapy.   Marland Kitchen URI (upper respiratory infection)     history  . Hx: UTI (urinary tract infection)   . Chronic kidney disease   . Pneumonia     Past Surgical History  Procedure Laterality Date  . Coronary angioplasty with stent placement    . Biopsy thyroid      No family history on file.  History  Substance Use Topics  . Smoking status: Never Smoker   . Smokeless tobacco: Never Used  . Alcohol Use: No    Review of Systems  Constitutional: Negative for appetite change and fatigue.  HENT: Positive for congestion. Negative for sinus pressure and ear discharge.   Eyes: Negative for discharge.  Respiratory: Positive for shortness of breath and wheezing. Negative for cough and choking.   Cardiovascular: Negative for chest pain.  Gastrointestinal: Negative for nausea, abdominal pain and diarrhea.  Genitourinary: Negative for frequency and hematuria.  Musculoskeletal: Negative for back pain.  Skin: Negative for rash.  Neurological: Negative for seizures and headaches.  Psychiatric/Behavioral: Negative for hallucinations.    Allergies  Review of patient's allergies indicates no known allergies.  Home Medications   Current Outpatient Rx  Name  Route  Sig  Dispense  Refill  . amiodarone (PACERONE) 200  MG tablet   Oral   Take 1 tablet (200 mg total) by mouth daily.   30 tablet   0   . amLODipine (NORVASC) 2.5 MG tablet   Oral   Take 2.5 mg by mouth every morning.          Marland Kitchen aspirin 81 MG tablet   Oral   Take 81 mg by mouth every morning.          Marland Kitchen azithromycin (ZITHROMAX) 250 MG tablet   Oral   Take 2 tablets (500 mg total) by mouth daily.   3 each   0   . furosemide (LASIX) 20 MG tablet   Oral   Take 20 mg by mouth as needed.         Marland Kitchen guaiFENesin (MUCINEX) 600 MG 12 hr tablet    Oral   Take 600 mg by mouth every morning.           Marland Kitchen HYDROcodone-acetaminophen (NORCO/VICODIN) 5-325 MG per tablet   Oral   Take 1-2 tablets by mouth every 4 (four) hours as needed.   30 tablet   0   . Ibuprofen (ADVIL) 200 MG CAPS   Oral   Take 1 capsule by mouth as needed.         . Ipratropium-Albuterol (COMBIVENT) 20-100 MCG/ACT AERS respimat   Inhalation   Inhale 2 puffs into the lungs every 6 (six) hours as needed for wheezing.   1 Inhaler   0   . isosorbide mononitrate (IMDUR) 60 MG 24 hr tablet   Oral   Take 60 mg by mouth every morning.          Marland Kitchen lisinopril (PRINIVIL,ZESTRIL) 10 MG tablet   Oral   Take 10 mg by mouth every morning.          . Multiple Vitamins-Iron (MULTIVITAMIN/IRON) TABS   Oral   Take 1 tablet by mouth every morning.           . nitroGLYCERIN (NITROLINGUAL) 0.4 MG/SPRAY spray   Sublingual   Place 2 sprays under the tongue every 5 (five) minutes as needed.          . nitroGLYCERIN (NITROSTAT) 0.4 MG SL tablet   Sublingual   Place 0.4 mg under the tongue every 5 (five) minutes as needed.         . OXYGEN-HELIUM IN   Inhalation   Inhale into the lungs at bedtime.         . pantoprazole (PROTONIX) 40 MG tablet   Oral   Take 40 mg by mouth every morning.          . potassium chloride SA (K-DUR,KLOR-CON) 20 MEQ tablet   Oral   Take 20 mEq by mouth every morning.          . simvastatin (ZOCOR) 20 MG tablet   Oral   Take 20 mg by mouth at bedtime.          . solifenacin (VESICARE) 5 MG tablet   Oral   Take 5 mg by mouth every morning.            BP 130/62  Pulse 83  Temp(Src) 97.7 F (36.5 C)  Resp 22  Wt 112 lb (50.803 kg)  BMI 20.48 kg/m2  SpO2 100%  Physical Exam  Constitutional: She is oriented to person, place, and time. She appears well-developed.  Distal and general weakness  HENT:  Head: Normocephalic.  Mucus membranes dry  Eyes: Conjunctivae and  EOM are normal. No scleral icterus.   Neck: Neck supple. No thyromegaly present.  Cardiovascular: Normal rate and regular rhythm.  Exam reveals no gallop and no friction rub.   No murmur heard. Pulmonary/Chest: No stridor. She has no wheezes. She has no rales. She exhibits no tenderness.  Crackles bilaterally  Abdominal: She exhibits no distension. There is no tenderness. There is no rebound.  Musculoskeletal: Normal range of motion. She exhibits no edema.  Lymphadenopathy:    She has no cervical adenopathy.  Neurological: She is oriented to person, place, and time. Coordination normal.  Skin: No rash noted. No erythema.  Psychiatric: She has a normal mood and affect. Her behavior is normal.    ED Course  Procedures DIAGNOSTIC STUDIES: Oxygen Saturation is 100% on [place], normal by my interpretation.    COORDINATION OF CARE: 15:33- Evaluated Pt. Pt is awake, alert, and without distress. 15:37- Patient and Family understand and agree with initial ED impression and plan with expectations set for ED visit. 19:13- Pt feels improved after observation and treatment in ED.   Orders placed during the hospital encounter of 09/23/12  . ED EKG  . ED EKG  . EKG 12-LEAD  . EKG 12-LEAD     Labs Reviewed  CBC WITH DIFFERENTIAL - Abnormal; Notable for the following:    RBC 5.13 (*)    Hemoglobin 15.2 (*)    Platelets 106 (*)    Neutrophils Relative % 87 (*)    Lymphocytes Relative 4 (*)    Lymphs Abs 0.3 (*)    All other components within normal limits  BASIC METABOLIC PANEL - Abnormal; Notable for the following:    GFR calc non Af Amer 54 (*)    GFR calc Af Amer 62 (*)    All other components within normal limits  PRO B NATRIURETIC PEPTIDE - Abnormal; Notable for the following:    Pro B Natriuretic peptide (BNP) 2560.0 (*)    All other components within normal limits   Dg Chest Portable 1 View  09/23/2012   *RADIOLOGY REPORT*  Clinical Data: Shortness of breath.  PORTABLE CHEST - 1 VIEW  Comparison: Chest x-ray  09/16/2012.  Findings: Lung volumes are low.  There are bibasilar opacities, favored to represent subsegmental atelectasis.  Possible trace right pleural effusion.  Pulmonary venous congestion, without frank pulmonary edema.  Mild cardiomegaly. The patient is rotated to the right on today's exam, resulting in distortion of the mediastinal contours and reduced diagnostic sensitivity and specificity for mediastinal pathology.  Atherosclerosis in the thoracic aorta. Right-sided calcified pleural plaques again noted.  IMPRESSION: 1.  Low lung volumes with probable bibasilar subsegmental atelectasis and small right-sided pleural effusion. 2.  Cardiomegaly with pulmonary venous congestion, but no frank pulmonary edema at this time. 3.  Atherosclerosis.   Original Report Authenticated By: Trudie Reed, M.D.     No diagnosis found.    MDM  Pt with sats 99-100.  Sent here from office with sats in the 70s.  Possible false reading     The chart was scribed for me under my direct supervision.  I personally performed the history, physical, and medical decision making and all procedures in the evaluation of this patient.[name], MD 09/23/12 [phone]

## 2012-09-23 NOTE — ED Notes (Signed)
Pt placed on O2 at 2L. Pt's hands cold, warm pack given. Pt speaking complete sentences. Nad noted.

## 2012-09-23 NOTE — ED Notes (Signed)
Pt recently d/c'd from hospital for AFIB. Pt f/u with Dr.Fusco today and had O2 saturation of 75% in office. O2 sats 70%-90% in route per ems. Denies cp. nad noted.

## 2012-09-26 ENCOUNTER — Encounter: Payer: Self-pay | Admitting: Cardiovascular Disease

## 2012-09-26 ENCOUNTER — Ambulatory Visit (INDEPENDENT_AMBULATORY_CARE_PROVIDER_SITE_OTHER): Payer: Medicare Other | Admitting: Cardiovascular Disease

## 2012-09-26 VITALS — BP 100/56 | HR 88 | Ht 60.0 in

## 2012-09-26 DIAGNOSIS — I872 Venous insufficiency (chronic) (peripheral): Secondary | ICD-10-CM

## 2012-09-26 DIAGNOSIS — I2510 Atherosclerotic heart disease of native coronary artery without angina pectoris: Secondary | ICD-10-CM

## 2012-09-26 DIAGNOSIS — R600 Localized edema: Secondary | ICD-10-CM

## 2012-09-26 DIAGNOSIS — R609 Edema, unspecified: Secondary | ICD-10-CM

## 2012-09-26 DIAGNOSIS — I4892 Unspecified atrial flutter: Secondary | ICD-10-CM

## 2012-09-26 DIAGNOSIS — I10 Essential (primary) hypertension: Secondary | ICD-10-CM

## 2012-09-26 MED ORDER — DILTIAZEM HCL ER COATED BEADS 120 MG PO CP24: 120.0000 mg | ORAL_CAPSULE | Freq: Every day | ORAL | Status: DC

## 2012-09-26 NOTE — Patient Instructions (Addendum)
Your physician has recommended you make the following change in your medication: STOP Amlodipine. START Diltiazem 120mg  daily.   Your physician recommends that you schedule a follow-up appointment in 4 weeks with extender.   Your physician recommends that you schedule a follow-up appointment in: 4 months.

## 2012-09-27 ENCOUNTER — Inpatient Hospital Stay (HOSPITAL_COMMUNITY)
Admission: EM | Admit: 2012-09-27 | Discharge: 2012-10-06 | DRG: 291 | Disposition: A | Discharge status: Skilled Nursing Facility | Payer: Medicare Other | Attending: Internal Medicine | Admitting: Internal Medicine

## 2012-09-27 ENCOUNTER — Emergency Department (HOSPITAL_COMMUNITY): Payer: Medicare Other

## 2012-09-27 ENCOUNTER — Encounter (HOSPITAL_COMMUNITY): Payer: Self-pay | Admitting: Emergency Medicine

## 2012-09-27 DIAGNOSIS — I872 Venous insufficiency (chronic) (peripheral): Secondary | ICD-10-CM

## 2012-09-27 DIAGNOSIS — N179 Acute kidney failure, unspecified: Secondary | ICD-10-CM | POA: Diagnosis not present

## 2012-09-27 DIAGNOSIS — R627 Adult failure to thrive: Secondary | ICD-10-CM | POA: Diagnosis present

## 2012-09-27 DIAGNOSIS — Z7982 Long term (current) use of aspirin: Secondary | ICD-10-CM

## 2012-09-27 DIAGNOSIS — J9620 Acute and chronic respiratory failure, unspecified whether with hypoxia or hypercapnia: Secondary | ICD-10-CM | POA: Diagnosis present

## 2012-09-27 DIAGNOSIS — I2510 Atherosclerotic heart disease of native coronary artery without angina pectoris: Secondary | ICD-10-CM | POA: Diagnosis present

## 2012-09-27 DIAGNOSIS — I739 Peripheral vascular disease, unspecified: Secondary | ICD-10-CM

## 2012-09-27 DIAGNOSIS — E119 Type 2 diabetes mellitus without complications: Secondary | ICD-10-CM | POA: Diagnosis present

## 2012-09-27 DIAGNOSIS — I5023 Acute on chronic systolic (congestive) heart failure: Principal | ICD-10-CM | POA: Diagnosis present

## 2012-09-27 DIAGNOSIS — E1159 Type 2 diabetes mellitus with other circulatory complications: Secondary | ICD-10-CM | POA: Diagnosis present

## 2012-09-27 DIAGNOSIS — Z79899 Other long term (current) drug therapy: Secondary | ICD-10-CM

## 2012-09-27 DIAGNOSIS — R4181 Age-related cognitive decline: Secondary | ICD-10-CM | POA: Diagnosis present

## 2012-09-27 DIAGNOSIS — I495 Sick sinus syndrome: Secondary | ICD-10-CM | POA: Diagnosis present

## 2012-09-27 DIAGNOSIS — N189 Chronic kidney disease, unspecified: Secondary | ICD-10-CM | POA: Diagnosis present

## 2012-09-27 DIAGNOSIS — Z9861 Coronary angioplasty status: Secondary | ICD-10-CM

## 2012-09-27 DIAGNOSIS — I472 Ventricular tachycardia: Secondary | ICD-10-CM

## 2012-09-27 DIAGNOSIS — M129 Arthropathy, unspecified: Secondary | ICD-10-CM | POA: Diagnosis present

## 2012-09-27 DIAGNOSIS — I129 Hypertensive chronic kidney disease with stage 1 through stage 4 chronic kidney disease, or unspecified chronic kidney disease: Secondary | ICD-10-CM | POA: Diagnosis present

## 2012-09-27 DIAGNOSIS — I509 Heart failure, unspecified: Secondary | ICD-10-CM | POA: Diagnosis present

## 2012-09-27 DIAGNOSIS — Z8701 Personal history of pneumonia (recurrent): Secondary | ICD-10-CM

## 2012-09-27 DIAGNOSIS — I5032 Chronic diastolic (congestive) heart failure: Secondary | ICD-10-CM

## 2012-09-27 DIAGNOSIS — M79609 Pain in unspecified limb: Secondary | ICD-10-CM

## 2012-09-27 DIAGNOSIS — D649 Anemia, unspecified: Secondary | ICD-10-CM

## 2012-09-27 DIAGNOSIS — J9601 Acute respiratory failure with hypoxia: Secondary | ICD-10-CM

## 2012-09-27 DIAGNOSIS — R05 Cough: Secondary | ICD-10-CM

## 2012-09-27 DIAGNOSIS — J690 Pneumonitis due to inhalation of food and vomit: Secondary | ICD-10-CM | POA: Diagnosis present

## 2012-09-27 DIAGNOSIS — R680 Hypothermia, not associated with low environmental temperature: Secondary | ICD-10-CM | POA: Diagnosis not present

## 2012-09-27 DIAGNOSIS — R0902 Hypoxemia: Secondary | ICD-10-CM | POA: Diagnosis present

## 2012-09-27 DIAGNOSIS — Z7901 Long term (current) use of anticoagulants: Secondary | ICD-10-CM

## 2012-09-27 DIAGNOSIS — R1310 Dysphagia, unspecified: Secondary | ICD-10-CM | POA: Diagnosis not present

## 2012-09-27 DIAGNOSIS — Z993 Dependence on wheelchair: Secondary | ICD-10-CM

## 2012-09-27 DIAGNOSIS — K219 Gastro-esophageal reflux disease without esophagitis: Secondary | ICD-10-CM | POA: Diagnosis present

## 2012-09-27 DIAGNOSIS — I4891 Unspecified atrial fibrillation: Secondary | ICD-10-CM | POA: Diagnosis present

## 2012-09-27 DIAGNOSIS — I70269 Atherosclerosis of native arteries of extremities with gangrene, unspecified extremity: Secondary | ICD-10-CM | POA: Diagnosis present

## 2012-09-27 DIAGNOSIS — I959 Hypotension, unspecified: Secondary | ICD-10-CM

## 2012-09-27 DIAGNOSIS — Z515 Encounter for palliative care: Secondary | ICD-10-CM

## 2012-09-27 DIAGNOSIS — I4892 Unspecified atrial flutter: Secondary | ICD-10-CM | POA: Diagnosis present

## 2012-09-27 DIAGNOSIS — D72819 Decreased white blood cell count, unspecified: Secondary | ICD-10-CM

## 2012-09-27 DIAGNOSIS — D693 Immune thrombocytopenic purpura: Secondary | ICD-10-CM | POA: Diagnosis present

## 2012-09-27 DIAGNOSIS — R64 Cachexia: Secondary | ICD-10-CM | POA: Diagnosis present

## 2012-09-27 DIAGNOSIS — F0390 Unspecified dementia without behavioral disturbance: Secondary | ICD-10-CM | POA: Diagnosis present

## 2012-09-27 DIAGNOSIS — Z87440 Personal history of urinary (tract) infections: Secondary | ICD-10-CM

## 2012-09-27 DIAGNOSIS — D72829 Elevated white blood cell count, unspecified: Secondary | ICD-10-CM | POA: Diagnosis not present

## 2012-09-27 DIAGNOSIS — Z8673 Personal history of transient ischemic attack (TIA), and cerebral infarction without residual deficits: Secondary | ICD-10-CM

## 2012-09-27 DIAGNOSIS — E785 Hyperlipidemia, unspecified: Secondary | ICD-10-CM | POA: Diagnosis present

## 2012-09-27 DIAGNOSIS — Z66 Do not resuscitate: Secondary | ICD-10-CM | POA: Diagnosis not present

## 2012-09-27 DIAGNOSIS — I743 Embolism and thrombosis of arteries of the lower extremities: Secondary | ICD-10-CM | POA: Diagnosis present

## 2012-09-27 DIAGNOSIS — Z681 Body mass index (BMI) 19 or less, adult: Secondary | ICD-10-CM

## 2012-09-27 DIAGNOSIS — R630 Anorexia: Secondary | ICD-10-CM | POA: Diagnosis present

## 2012-09-27 DIAGNOSIS — G8929 Other chronic pain: Secondary | ICD-10-CM | POA: Diagnosis present

## 2012-09-27 LAB — CBC WITH DIFFERENTIAL/PLATELET
Basophils Relative: 0 % (ref 0–1)
Eosinophils Absolute: 0 10*3/uL (ref 0.0–0.7)
Eosinophils Relative: 0 % (ref 0–5)
HCT: 39.9 % (ref 36.0–46.0)
Hemoglobin: 13.2 g/dL (ref 12.0–15.0)
MCH: 29.3 pg (ref 26.0–34.0)
MCHC: 33.1 g/dL (ref 30.0–36.0)
MCV: 88.5 fL (ref 78.0–100.0)
Monocytes Absolute: 0.8 10*3/uL (ref 0.1–1.0)
Monocytes Relative: 9 % (ref 3–12)

## 2012-09-27 LAB — COMPREHENSIVE METABOLIC PANEL
Alkaline Phosphatase: 57 U/L (ref 39–117)
BUN: 17 mg/dL (ref 6–23)
Creatinine, Ser: 0.9 mg/dL (ref 0.50–1.10)
GFR calc Af Amer: 64 mL/min — ABNORMAL LOW (ref >90)
Glucose, Bld: 151 mg/dL — ABNORMAL HIGH (ref 70–99)
Potassium: 3.9 mEq/L (ref 3.5–5.1)
Total Protein: 6.3 g/dL (ref 6.0–8.3)

## 2012-09-27 LAB — BLOOD GAS, ARTERIAL
Acid-base deficit: 1.5 mmol/L (ref 0.0–2.0)
O2 Content: 5 L/min
O2 Saturation: 90.1 %
pCO2 arterial: 30.3 mmHg — ABNORMAL LOW (ref 35.0–45.0)

## 2012-09-27 LAB — GLUCOSE, CAPILLARY: Glucose-Capillary: 210 mg/dL — ABNORMAL HIGH (ref 70–99)

## 2012-09-27 LAB — URINALYSIS, ROUTINE W REFLEX MICROSCOPIC
Glucose, UA: NEGATIVE mg/dL
Hgb urine dipstick: NEGATIVE
Ketones, ur: NEGATIVE mg/dL
Protein, ur: NEGATIVE mg/dL

## 2012-09-27 MED ORDER — SODIUM CHLORIDE 0.9 % IV SOLN: 250.0000 mL | INTRAVENOUS | Status: DC | PRN

## 2012-09-27 MED ORDER — CARVEDILOL 6.25 MG PO TABS
6.2500 mg | ORAL_TABLET | Freq: Two times a day (BID) | ORAL | Status: DC
  Administered 2012-09-28 – 2012-10-02 (×9): 6.25 mg via ORAL
  Filled 2012-09-27: qty 1
  Filled 2012-09-27: qty 2
  Filled 2012-09-27 (×2): qty 1
  Filled 2012-09-27: qty 2
  Filled 2012-09-27 (×5): qty 1
  Filled 2012-09-27 (×2): qty 2
  Filled 2012-09-27: qty 1

## 2012-09-27 MED ORDER — IPRATROPIUM-ALBUTEROL 18-103 MCG/ACT IN AERO
2.0000 | INHALATION_SPRAY | Freq: Four times a day (QID) | RESPIRATORY_TRACT | Status: DC | PRN
  Filled 2012-09-27: qty 14.7

## 2012-09-27 MED ORDER — FUROSEMIDE 10 MG/ML IJ SOLN
40.0000 mg | Freq: Two times a day (BID) | INTRAMUSCULAR | Status: DC
  Administered 2012-09-27 – 2012-09-29 (×4): 40 mg via INTRAVENOUS
  Filled 2012-09-27 (×4): qty 4

## 2012-09-27 MED ORDER — PANTOPRAZOLE SODIUM 40 MG PO TBEC
40.0000 mg | DELAYED_RELEASE_TABLET | Freq: Every morning | ORAL | Status: DC
  Administered 2012-09-28 – 2012-10-05 (×6): 40 mg via ORAL
  Filled 2012-09-27 (×5): qty 1

## 2012-09-27 MED ORDER — SODIUM CHLORIDE 0.9 % IJ SOLN
3.0000 mL | INTRAMUSCULAR | Status: DC | PRN
  Administered 2012-09-28: 3 mL via INTRAVENOUS

## 2012-09-27 MED ORDER — LISINOPRIL 10 MG PO TABS
10.0000 mg | ORAL_TABLET | Freq: Every morning | ORAL | Status: DC
  Administered 2012-09-28 – 2012-10-02 (×4): 10 mg via ORAL
  Filled 2012-09-27 (×6): qty 1

## 2012-09-27 MED ORDER — SODIUM CHLORIDE 0.9 % IV SOLN: INTRAVENOUS | Status: DC

## 2012-09-27 MED ORDER — POTASSIUM CHLORIDE CRYS ER 20 MEQ PO TBCR
20.0000 meq | EXTENDED_RELEASE_TABLET | Freq: Every morning | ORAL | Status: DC
  Administered 2012-09-28 – 2012-10-05 (×6): 20 meq via ORAL
  Filled 2012-09-27 (×10): qty 1

## 2012-09-27 MED ORDER — DILTIAZEM HCL 100 MG IV SOLR
5.0000 mg/h | Freq: Once | INTRAVENOUS | Status: DC
  Filled 2012-09-27: qty 100

## 2012-09-27 MED ORDER — ISOSORBIDE MONONITRATE ER 60 MG PO TB24
60.0000 mg | ORAL_TABLET | Freq: Every morning | ORAL | Status: DC
  Administered 2012-09-28 – 2012-10-02 (×4): 60 mg via ORAL
  Filled 2012-09-27 (×6): qty 1

## 2012-09-27 MED ORDER — HYDROCODONE-ACETAMINOPHEN 5-325 MG PO TABS
1.0000 | ORAL_TABLET | ORAL | Status: DC | PRN
  Administered 2012-09-28 – 2012-10-01 (×3): 1 via ORAL
  Filled 2012-09-27 (×3): qty 1

## 2012-09-27 MED ORDER — ENOXAPARIN SODIUM 30 MG/0.3ML ~~LOC~~ SOLN
30.0000 mg | SUBCUTANEOUS | Status: DC
  Administered 2012-09-27 – 2012-09-28 (×2): 30 mg via SUBCUTANEOUS
  Filled 2012-09-27 (×2): qty 0.3

## 2012-09-27 MED ORDER — ACETAMINOPHEN 325 MG PO TABS
650.0000 mg | ORAL_TABLET | ORAL | Status: DC | PRN
  Administered 2012-09-28 – 2012-10-05 (×4): 650 mg via ORAL
  Filled 2012-09-27 (×4): qty 2

## 2012-09-27 MED ORDER — ADULT MULTIVITAMIN W/MINERALS CH
1.0000 | ORAL_TABLET | ORAL | Status: DC
  Administered 2012-09-28 – 2012-10-05 (×6): 1 via ORAL
  Filled 2012-09-27 (×11): qty 1

## 2012-09-27 MED ORDER — SODIUM CHLORIDE 0.9 % IJ SOLN
3.0000 mL | Freq: Two times a day (BID) | INTRAMUSCULAR | Status: DC
  Administered 2012-09-27 – 2012-10-05 (×12): 3 mL via INTRAVENOUS

## 2012-09-27 MED ORDER — ONDANSETRON HCL 4 MG/2ML IJ SOLN: 4.0000 mg | Freq: Four times a day (QID) | INTRAMUSCULAR | Status: DC | PRN

## 2012-09-27 MED ORDER — DILTIAZEM HCL 25 MG/5ML IV SOLN: 10.0000 mg | Freq: Once | INTRAVENOUS | Status: DC

## 2012-09-27 MED ORDER — SIMVASTATIN 20 MG PO TABS
20.0000 mg | ORAL_TABLET | Freq: Every day | ORAL | Status: DC
  Administered 2012-09-27 – 2012-10-05 (×7): 20 mg via ORAL
  Filled 2012-09-27 (×10): qty 1

## 2012-09-27 MED ORDER — SODIUM CHLORIDE 0.9 % IV SOLN
INTRAVENOUS | Status: DC
  Administered 2012-09-27 – 2012-09-30 (×3): via INTRAVENOUS

## 2012-09-27 MED ORDER — DARIFENACIN HYDROBROMIDE ER 7.5 MG PO TB24
7.5000 mg | ORAL_TABLET | Freq: Every day | ORAL | Status: DC
  Administered 2012-09-27 – 2012-10-05 (×7): 7.5 mg via ORAL
  Filled 2012-09-27 (×11): qty 1

## 2012-09-27 MED ORDER — ASPIRIN 81 MG PO TABS
81.0000 mg | ORAL_TABLET | Freq: Every morning | ORAL | Status: DC
  Filled 2012-09-27: qty 1

## 2012-09-27 MED ORDER — NITROGLYCERIN 0.4 MG SL SUBL: 0.4000 mg | SUBLINGUAL_TABLET | SUBLINGUAL | Status: DC | PRN

## 2012-09-27 MED ORDER — IBUPROFEN 400 MG PO TABS: 200.0000 mg | ORAL_TABLET | Freq: Four times a day (QID) | ORAL | Status: DC | PRN

## 2012-09-27 NOTE — ED Notes (Signed)
Patient brought in via EMS from home. Patient in resp distress found by home health care nurse-O2 sat of 46. Per EMS patient O2 sat 88% on non rebreather-placed on c-pap by EMS. O2 sat now 97%.  HR 140s. Blood sugar 114.

## 2012-09-27 NOTE — H&P (Addendum)
[facility] History and Physical  [name] ZOX:[id number] DOB: [birth year] DOA: 09/27/2012  Referring physician: Bruce Donath, ER physician PCP: Cassell Smiles., MD  Specialists: None  Chief Complaint: Shortness of breath  HPI: [name] is a 79 y.o. female  With past medical history of systolic CHF, mild dementia and diabetes we just discharged from the hospitalist service one week prior after a brief stay for episodes of nonsustained ventricular tachycardia. After being home for the last few days, she did have some progressively worsening shortness of breath which initially felt to be mild congestion. However today patient became quite short of breath and paramedics were called. Patient was brought in in acute respiratory distress and was placed on oxygen nonrebreather. In the emergency room, she was noted to have decreased oxygen saturations on that and had to be placed on BiPAP. She is also noted to be in rapid atrial flutter and was started on IV Cardizem. Lab work was done and patient was noted to have a BNP of 3230 and a normal white count with 84% neutrophils. Chest x-ray was unremarkable, as was renal function and urinalysis. Patient's heart rate started to come down and Cardizem drip was discontinued. Patient was given a dose of IV Lasix in the emergency room as well and at the time hospitalists were called for admission, patient was feeling much better and able to be weaned down to nasal cannula.  Review of Systems: Patient seen after arrival to the floor. She does okay. States that her breathing is better, although still a little rough. Her main complaint is of fatigue. She denies any headaches, chest pain, coughing. She denies abdominal pain. She does complain of some knee swelling and pain. Review systems is otherwise negative.  Past Medical History  Diagnosis Date  . Coronary artery disease   . Ventricular tachycardia   . CHF (congestive heart failure)   . GERD  (gastroesophageal reflux disease)   . Atrial flutter   . Sick sinus syndrome   . Dementia   . Diabetes mellitus   . Thrombocytopenia   . Right bundle branch block   . Hyperlipidemia   . TIA (transient ischemic attack)   . Arthritis   . ITP (idiopathic thrombocytopenic purpura) 07/14/2011    Chronic low grade idiopathic thrombocytopenia purpura still not in need of therapy.   Marland Kitchen URI (upper respiratory infection)     history  . Hx: UTI (urinary tract infection)   . Chronic kidney disease   . Pneumonia    Past Surgical History  Procedure Laterality Date  . Coronary angioplasty with stent placement    . Biopsy thyroid     Social History:  reports that she has never smoked. She has never used smokeless tobacco. She reports that she does not drink alcohol or use illicit drugs. Patient lives at home with the rest of her family. She is mostly bed and wheelchair-bound. She has not ambulated in 5 years.  No Known Allergies  Family history: Hypertension  Prior to Admission medications   Medication Sig Start Date End Date Taking? Authorizing Provider  albuterol-ipratropium (COMBIVENT) 18-103 MCG/ACT inhaler Inhale 2 puffs into the lungs every 6 (six) hours as needed for wheezing.   Yes Historical Provider, MD  aspirin 81 MG tablet Take 81 mg by mouth every morning.    Yes Historical Provider, MD  diltiazem (CARDIZEM CD) 120 MG 24 hr capsule Take 1 capsule (120 mg total) by mouth daily. 09/26/12  Yes Lennette Bihari,  MD  guaiFENesin (MUCINEX) 600 MG 12 hr tablet Take 600 mg by mouth every morning.     Yes Historical Provider, MD  HYDROcodone-acetaminophen (NORCO/VICODIN) 5-325 MG per tablet Take 1 tablet by mouth every 4 (four) hours as needed. 09/20/12  Yes Nimish Normajean Glasgow, MD  ibuprofen (ADVIL,MOTRIN) 200 MG tablet Take 200 mg by mouth every 6 (six) hours as needed for pain.   Yes Historical Provider, MD  isosorbide mononitrate (IMDUR) 60 MG 24 hr tablet Take 60 mg by mouth every morning.    Yes  Historical Provider, MD  lisinopril (PRINIVIL,ZESTRIL) 10 MG tablet Take 10 mg by mouth every morning.    Yes Historical Provider, MD  Multiple Vitamins-Iron (MULTIVITAMIN/IRON) TABS Take 1 tablet by mouth every morning.     Yes Historical Provider, MD  nitroGLYCERIN (NITROSTAT) 0.4 MG SL tablet Place 0.4 mg under the tongue every 5 (five) minutes as needed.   Yes Historical Provider, MD  pantoprazole (PROTONIX) 40 MG tablet Take 40 mg by mouth every morning.    Yes Historical Provider, MD  potassium chloride SA (K-DUR,KLOR-CON) 20 MEQ tablet Take 20 mEq by mouth every morning.    Yes Historical Provider, MD  simvastatin (ZOCOR) 20 MG tablet Take 20 mg by mouth at bedtime.    Yes Historical Provider, MD  solifenacin (VESICARE) 5 MG tablet Take 5 mg by mouth every morning.    Yes Historical Provider, MD   Physical Exam: Filed Vitals:   09/27/12 1540 09/27/12 1656 09/27/12 1817 09/27/12 1819  BP: 105/69 97/78 122/78   Pulse: 102 101 109   Temp:   97.4 F (36.3 C)   TempSrc:   Oral   Resp: 18 30 20    Height:    5' (1.524 m)  Weight:    47.628 kg (105 lb)  SpO2: 95% 100%       General:  Alert and oriented x2, no acute distress  Eyes: Sclera nonicteric, extraocular movements are intact  ENT: Normocephalic and atraumatic the mucous membranes are slightly dry  Neck: Supple, JVD  Cardiovascular: Irregular rhythm, borderline tachycardia  Respiratory: Decreased breath sounds throughout, more so at the bases  Abdomen: Soft, nontender, nondistended, hypoactive bowel sounds  Skin: Superficial bruising, but no skin breaks, tears or lesions  Musculoskeletal: No clubbing or cyanosis, trace pitting edema lower extremities, mostly emaciated  Psychiatric: Patient has some mild dementia, but actually interact with me and is quite appropriate.  Neurologic: No overt deficits  Labs on Admission:  Basic Metabolic Panel:  Recent Labs Lab 09/23/12 1545 09/27/12 1522  NA 142 142  K 4.5 3.9   CL 101 104  CO2 24 26  GLUCOSE 78 151*  BUN 18 17  CREATININE 0.92 0.90  CALCIUM 10.4 9.7   Liver Function Tests:  Recent Labs Lab 09/27/12 1522  AST 17  ALT 32  ALKPHOS 57  BILITOT 0.7  PROT 6.3  ALBUMIN 2.8*   CBC:  Recent Labs Lab 09/23/12 1545 09/27/12 1522  WBC 8.2 9.3  NEUTROABS 7.1 7.8*  HGB 15.2* 13.2  HCT 45.3 39.9  MCV 88.3 88.5  PLT 106* 129*    BNP (last 3 results)  Recent Labs  09/16/12 1342 09/23/12 1545 09/27/12 1522  PROBNP 3786.0* 2560.0* 3230.0*    Radiological Exams on Admission: Dg Chest Portable 1 View  09/27/2012    IMPRESSION:  1.  Stable cardiomegaly. 2.  Artifacts overlie the left chest.  No definite pneumonia, but the left mid and upper lung is  difficult to assess due to the overlapping artifactual opacities.   Original Report Authenticated By: Dwyane Dee, M.D.    EKG: Independently reviewed. A flutter with variable AV block. Right bundle branch block. Left ventricular hypertrophy.  Assessment/Plan Principal Problem:   Acute on chronic systolic heart failure: Doing better. Continue IV Lasix. Already on ACE inhibitor. No allergy so will try to switch Cardizem for beta blocker. Continue daily aspirin. Active Problems:   Coronary artery disease   GERD (gastroesophageal reflux disease): Continue PPI   Dementia: Stable   Diabetes: Sliding scale   Atrial flutter: Better controlled now, now that respiratory distress has decreased. We'll try to use beta blocker instead which would better for her heart failure. If symptoms persist, consult cardiology.   Code Status: Limited code. No compressions. Intubation and drugs are okay.  Family Communication: Plan discussed with patient and multiple children at the bedside.  Disposition Plan: Home with home health in a few days.  Time spent:  35 minutes  Hollice Espy [facility] Pager [phone]  If 7PM-7AM, please contact night-coverage [email or web address] Password  [facility] 09/27/2012, 8:09 PM

## 2012-09-27 NOTE — ED Provider Notes (Signed)
History     CSN: [id number]  Arrival date & time 09/27/12  1401   First MD Initiated Contact with Patient 09/27/12 1411      Chief Complaint  Patient presents with  . Respiratory Distress    (Consider location/radiation/quality/duration/timing/severity/associated sxs/prior treatment) The history is provided by the patient, a relative and the EMS personnel. The history is limited by the condition of the patient.   patient here with acute onset of respiratory distress this prior to arrival. According to EMS, patient had rhonchi and was placed on nonrebreather and she had low oxygen saturation. She was subsequently placed on CPAP and did improve. No further history obtainable due to her severe state  Past Medical History  Diagnosis Date  . Coronary artery disease   . Ventricular tachycardia   . CHF (congestive heart failure)   . GERD (gastroesophageal reflux disease)   . Atrial flutter   . Sick sinus syndrome   . Dementia   . Diabetes mellitus   . Thrombocytopenia   . Right bundle branch block   . Hyperlipidemia   . TIA (transient ischemic attack)   . Arthritis   . ITP (idiopathic thrombocytopenic purpura) 07/14/2011    Chronic low grade idiopathic thrombocytopenia purpura still not in need of therapy.   Marland Kitchen URI (upper respiratory infection)     history  . Hx: UTI (urinary tract infection)   . Chronic kidney disease   . Pneumonia     Past Surgical History  Procedure Laterality Date  . Coronary angioplasty with stent placement    . Biopsy thyroid      History reviewed. No pertinent family history.  History  Substance Use Topics  . Smoking status: Never Smoker   . Smokeless tobacco: Never Used  . Alcohol Use: No    OB History   Grav Para Term Preterm Abortions TAB SAB Ect Mult Living                  Review of Systems  Unable to perform ROS   Allergies  Review of patient's allergies indicates no known allergies.  Home Medications   Current Outpatient Rx   Name  Route  Sig  Dispense  Refill  . aspirin 81 MG tablet   Oral   Take 81 mg by mouth every morning.          . diltiazem (CARDIZEM CD) 120 MG 24 hr capsule   Oral   Take 1 capsule (120 mg total) by mouth daily.   30 capsule   3   . diphenoxylate-atropine (LOMOTIL) 2.5-0.025 MG per tablet   Oral   Take 1 tablet by mouth daily.          . furosemide (LASIX) 20 MG tablet   Oral   Take 20 mg by mouth as needed.         Marland Kitchen guaiFENesin (MUCINEX) 600 MG 12 hr tablet   Oral   Take 600 mg by mouth every morning.           Marland Kitchen HYDROcodone-acetaminophen (NORCO/VICODIN) 5-325 MG per tablet   Oral   Take 1-2 tablets by mouth every 4 (four) hours as needed.   30 tablet   0   . Ibuprofen (ADVIL) 200 MG CAPS   Oral   Take 1 capsule by mouth as needed.         . Ipratropium-Albuterol (COMBIVENT) 20-100 MCG/ACT AERS respimat   Inhalation   Inhale 2 puffs into the lungs every 6 (  six) hours as needed for wheezing.   1 Inhaler   0   . isosorbide mononitrate (IMDUR) 60 MG 24 hr tablet   Oral   Take 60 mg by mouth every morning.          Marland Kitchen lisinopril (PRINIVIL,ZESTRIL) 10 MG tablet   Oral   Take 10 mg by mouth every morning.          . Multiple Vitamins-Iron (MULTIVITAMIN/IRON) TABS   Oral   Take 1 tablet by mouth every morning.           . nitroGLYCERIN (NITROLINGUAL) 0.4 MG/SPRAY spray   Sublingual   Place 2 sprays under the tongue every 5 (five) minutes as needed.          . nitroGLYCERIN (NITROSTAT) 0.4 MG SL tablet   Sublingual   Place 0.4 mg under the tongue every 5 (five) minutes as needed.         . OXYGEN-HELIUM IN   Inhalation   Inhale into the lungs at bedtime.         . pantoprazole (PROTONIX) 40 MG tablet   Oral   Take 40 mg by mouth every morning.          . potassium chloride SA (K-DUR,KLOR-CON) 20 MEQ tablet   Oral   Take 20 mEq by mouth every morning.          . simvastatin (ZOCOR) 20 MG tablet   Oral   Take 20 mg by mouth  at bedtime.          . solifenacin (VESICARE) 5 MG tablet   Oral   Take 5 mg by mouth every morning.            BP 136/63  Pulse 128  Resp 33  Wt 112 lb (50.803 kg)  BMI 21.87 kg/m2  SpO2 95%  Physical Exam  Nursing note and vitals reviewed. Constitutional: She is oriented to person, place, and time.  Non-toxic appearance. She appears distressed.  HENT:  Head: Normocephalic and atraumatic.  Eyes: Conjunctivae, EOM and lids are normal. Pupils are equal, round, and reactive to light.  Neck: Normal range of motion. Neck supple. No tracheal deviation present. No mass present.  Cardiovascular: Regular rhythm and normal heart sounds.  Tachycardia present.  Exam reveals no gallop.   No murmur heard. Pulmonary/Chest: No stridor. Tachypnea noted. She is in respiratory distress. She has decreased breath sounds. She has wheezes. She has rhonchi. She has no rales.  Abdominal: Soft. Normal appearance and bowel sounds are normal. She exhibits no distension. There is no tenderness. There is no rebound and no CVA tenderness.  Musculoskeletal: Normal range of motion. She exhibits no edema and no tenderness.  3+ bilateral pitting edema lower extremity  Neurological: She is alert and oriented to person, place, and time. She has normal strength. No cranial nerve deficit or sensory deficit. GCS eye subscore is 4. GCS verbal subscore is 5. GCS motor subscore is 6.  Skin: Skin is warm. No abrasion and no rash noted. She is diaphoretic.  Psychiatric: Her mood appears anxious. Her speech is delayed. She is agitated.    ED Course  Procedures (including critical care time)  Labs Reviewed  CBC WITH DIFFERENTIAL  PRO B NATRIURETIC PEPTIDE  COMPREHENSIVE METABOLIC PANEL   No results found.   No diagnosis found.    MDM   Date: 09/27/2012  Rate: 140  Rhythm: atrial flutter  QRS Axis: normal  Intervals: normal  ST/T Wave  abnormalities: nonspecific ST changes  Conduction Disutrbances:none   Narrative Interpretation:   Old EKG Reviewed: unchanged  Patient was in severe respiratory distress upon her initial arrival and was on CPAP. She did improve her oxygenation and was eventually transitioned to 4 L nasal cannula. She presented in atrial flutter at first and Cardizem was ordered however her rate came down and therefore it was discontinued. She was rechecked multiple times and her oxygenation has continued to improve. She appears to be in CHF at this time. Blood gas results noted. Spoke with family about CODE STATUS and they request that she be intubated if need be. She is able to maintain her airway at this time. I spoke with [facility] hospitalist and patient will be admitted   CRITICAL CARE Performed by: Toy Baker Total critical care time: 61 Critical care time was exclusive of separately billable procedures and treating other patients. Critical care was necessary to treat or prevent imminent or life-threatening deterioration. Critical care was time spent personally by me on the following activities: development of treatment plan with patient and/or surrogate as well as nursing, discussions with consultants, evaluation of patient's response to treatment, examination of patient, obtaining history from patient or surrogate, ordering and performing treatments and interventions, ordering and review of laboratory studies, ordering and review of radiographic studies, pulse oximetry and re-evaluation of patient's condition.           Toy Baker, MD 09/27/12 [phone]

## 2012-09-27 NOTE — ED Notes (Signed)
Pt removed from bipap, placed on [place] at 4LPM , tolerating well with oxygen sats 93-95%.

## 2012-09-27 NOTE — ED Notes (Addendum)
Pt arrived to er by [facility] with c/o respiratory distress, family members report that pt has been sob of breath for the past week, tryiing to sleep sitting up in wheelchair, sob became worse today, pt arrived to er on bipap by EMS with improvement in sob, pt has bilateral leg swelling. Dr. Isaias Cowman at bedside upon pt arrival to er,

## 2012-09-27 NOTE — ED Notes (Signed)
EDP advised to hold off on cardizem due to heart rate being 93-114

## 2012-09-28 DIAGNOSIS — J9601 Acute respiratory failure with hypoxia: Secondary | ICD-10-CM

## 2012-09-28 LAB — BASIC METABOLIC PANEL WITH GFR
BUN: 14 mg/dL (ref 6–23)
CO2: 29 meq/L (ref 19–32)
Calcium: 9.5 mg/dL (ref 8.4–10.5)
Chloride: 104 meq/L (ref 96–112)
Creatinine, Ser: 1 mg/dL (ref 0.50–1.10)
GFR calc Af Amer: 56 mL/min — ABNORMAL LOW
GFR calc non Af Amer: 48 mL/min — ABNORMAL LOW
Glucose, Bld: 152 mg/dL — ABNORMAL HIGH (ref 70–99)
Potassium: 4 meq/L (ref 3.5–5.1)
Sodium: 143 meq/L (ref 135–145)

## 2012-09-28 LAB — CBC
HCT: 38.4 % (ref 36.0–46.0)
Hemoglobin: 12.6 g/dL (ref 12.0–15.0)
MCHC: 32.8 g/dL (ref 30.0–36.0)
MCV: 88.5 fL (ref 78.0–100.0)

## 2012-09-28 LAB — GLUCOSE, CAPILLARY: Glucose-Capillary: 272 mg/dL — ABNORMAL HIGH (ref 70–99)

## 2012-09-28 MED ORDER — GLUCERNA SHAKE PO LIQD
237.0000 mL | Freq: Three times a day (TID) | ORAL | Status: DC
  Administered 2012-09-28 – 2012-10-04 (×11): 237 mL via ORAL
  Filled 2012-09-28: qty 237

## 2012-09-28 MED ORDER — INSULIN ASPART 100 UNIT/ML ~~LOC~~ SOLN
0.0000 [IU] | Freq: Three times a day (TID) | SUBCUTANEOUS | Status: DC
  Administered 2012-09-28: 5 [IU] via SUBCUTANEOUS
  Administered 2012-09-29 (×2): 1 [IU] via SUBCUTANEOUS
  Administered 2012-09-30: 3 [IU] via SUBCUTANEOUS
  Administered 2012-10-01: 17:00:00 via SUBCUTANEOUS
  Administered 2012-10-01: 3 [IU] via SUBCUTANEOUS
  Administered 2012-10-02: 2 [IU] via SUBCUTANEOUS
  Administered 2012-10-02: 1 [IU] via SUBCUTANEOUS
  Administered 2012-10-04 (×2): 2 [IU] via SUBCUTANEOUS
  Administered 2012-10-05: 3 [IU] via SUBCUTANEOUS

## 2012-09-28 MED ORDER — MORPHINE SULFATE 2 MG/ML IJ SOLN
2.0000 mg | INTRAMUSCULAR | Status: DC | PRN
  Administered 2012-09-28 – 2012-09-29 (×3): 2 mg via INTRAVENOUS
  Filled 2012-09-28 (×3): qty 1

## 2012-09-28 MED ORDER — ASPIRIN 81 MG PO CHEW
81.0000 mg | CHEWABLE_TABLET | Freq: Every day | ORAL | Status: DC
  Administered 2012-09-28 – 2012-10-05 (×6): 81 mg via ORAL
  Filled 2012-09-28 (×5): qty 1

## 2012-09-28 NOTE — Progress Notes (Addendum)
Pt continues to c/o of right knee pain.  Received Vicodin at 1640.  Pt's daughter states that during last hospital visit pt received one dose of Morphine for knee pain and had relief.  Family also asked about possible xray for right knee.  Dr. Irene Limbo notified via text page.

## 2012-09-28 NOTE — Progress Notes (Signed)
[facility] PROGRESS NOTE  [name] ZOX:[id number] DOB: [birth year] DOA: 09/27/2012 PCP: Cassell Smiles., MD Cardiologist: Dr. Tresa Endo  Assessment/Plan: 1. Acute on chronic systolic congestive heart failure: Clinically improving. Continue Lasix, beta blocker, lisinopril. 2. Acute respiratory failure with hypoxia: Superimposed on chronic respiratory failure (nighttime oxygen). Wean oxygen as tolerated. Treat heart failure. 3. Atrial flutter with rapid ventricular response: Largely rate controlled at this point. Continue Coreg. 4. History of nonsustained ventricular tachycardia: Continue telemetry. No recurrence documented. 5. Chronic thrombocytopenia: Secondary to ITP, no treatment required per previous documentation. 6. Diabetes mellitus type 2 diet controlled: Hemoglobin A1c 6.08/2012. Start sliding scale insulin. 7. Dementia: Appears stable.   Obtain last cardiology office note  Wean oxygen as tolerated  Start sliding scale insulin  Now full code per daughter/healthcare power of attorney  Discussed in detail with daughter is at bedside including healthcare power of attorney. We discussed treatment options in the event of clinical decline, respiratory or cardiac arrest. We discussed components of CPR and resuscitation in detail including aggressive compressions, cardioversion or defibrillation, medications and intubation. We discussed morbidity and mortality of these components. Healthcare power of attorney is torn as other family members have desires about two thirds. At this point she requests the patient be full code.  Code Status: Full code DVT prophylaxis: Lovenox Family Communication: As above Disposition Plan: Return home when improved  Brendia Sacks, MD  [facility]  Pager [phone] If 7PM-7AM, please contact night-coverage at [email or web address], password [facility] 09/28/2012, 12:10 PM  LOS: 1 day   Clinical Summary: 79 year old woman with history of systolic  heart failure presented to the emergency department with atrial flutter with rapid ventricular response and severe respiratory distress on CPAP. She was transitioned 4 L nasal cannula in the emergency department. Repeat heart rate spontaneously improved with treatment of respiratory issues.  Consultants:   Procedures:    HPI/Subjective: Afebrile, heart rate elevated last night but currently in the 90s. Blood pressure stable. High oxygen requirement. Overall daughter at bedside feels the patient is improved remarkably. Patient denied shortness of breath or complaints.  Objective: Filed Vitals:   09/28/12 0215 09/28/12 0548 09/28/12 0801 09/28/12 1057  BP: 117/73 129/81 118/73 109/65  Pulse: 121 91 94 92  Temp: 98.5 F (36.9 C) 97.7 F (36.5 C)    TempSrc:      Resp: 18 16    Height:      Weight:  47.764 kg (105 lb 4.8 oz)    SpO2: 96% 100% 100% 99%    Intake/Output Summary (Last 24 hours) at 09/28/12 1210 Last data filed at 09/28/12 1000  Gross per 24 hour  Intake    120 ml  Output   3550 ml  Net  -3430 ml     Filed Weights   09/27/12 1409 09/27/12 1819 09/28/12 0548  Weight: 50.803 kg (112 lb) 47.628 kg (105 lb) 47.764 kg (105 lb 4.8 oz)    Exam:  General:  Appears calm and comfortable, eating lunch Cardiovascular: RRR, no m/r/g. No LE edema. Telemetry: Atrial fibrillation, no arrhythmias  Respiratory: CTA bilaterally, no w/r/r. Normal respiratory effort. Abdomen: soft, ntnd Psychiatric: Appears confused, grossly normal mood and affect. Speech fluent and appropriate. Neurologic: grossly non-focal.  Data Reviewed:  Platelets stable 130. Basic metabolic panel unremarkable.  Pending studies:  None  Scheduled Meds: . aspirin  81 mg Oral Daily  . carvedilol  6.25 mg Oral BID WC  . darifenacin  7.5 mg Oral Daily  . enoxaparin (LOVENOX) injection  30 mg Subcutaneous Q24H  . furosemide  40 mg Intravenous Q12H  . isosorbide mononitrate  60 mg Oral q morning -  10a  . lisinopril  10 mg Oral q morning - 10a  . multivitamin with minerals  1 tablet Oral BH-q7a  . pantoprazole  40 mg Oral q morning - 10a  . potassium chloride SA  20 mEq Oral q morning - 10a  . simvastatin  20 mg Oral QHS  . sodium chloride  3 mL Intravenous Q12H   Continuous Infusions: . sodium chloride 10 mL/hr at 09/27/12 1440    Principal Problem:   Acute on chronic systolic heart failure Active Problems:   Coronary artery disease   GERD (gastroesophageal reflux disease)   Dementia   Diabetes   Atrial flutter   Time spent 20 minutes

## 2012-09-28 NOTE — Progress Notes (Signed)
RN notified by [employer] that pt had an 8 beat run of V Tach with rate in 150s.  Rate now in 90s running AFib. Pt resting in bed.  Alert and talking with no complaints.  Family at bedside.  Dr. Irene Limbo notified via text page.

## 2012-09-28 NOTE — Progress Notes (Signed)
Release of records form faxed to Dr. Tresa Endo at [facility] for most recent office visit records.

## 2012-09-28 NOTE — Care Management Note (Signed)
    Page 1 of 1   10/05/2012     12:02:38 PM   CARE MANAGEMENT NOTE 10/05/2012  Patient:  [name], [name]   Account Number:  [id number]  Date Initiated:  09/28/2012  Documentation initiated by:  Sharrie Rothman  Subjective/Objective Assessment:   Pt admitted from home with CHF. Pt lives with her daughter and is active with [facility] RN, PT, and aide. Pt is requiring more care than the family can provide.     Action/Plan:   PT consult placed due to family wanting placement. CSW is aware and will follow case. If pt discharges back home, CM will need to arrange resumption of HH.   Anticipated DC Date:  10/03/2012   Anticipated DC Plan:  SKILLED NURSING FACILITY  In-house referral  Clinical Social Worker      DC Planning Services  CM consult      Choice offered to / List presented to:             Status of service:  In process, will continue to follow Medicare Important Message given?   (If response is "NO", the following Medicare IM given date fields will be blank) Date Medicare IM given:   Date Additional Medicare IM given:    Discharge Disposition:    Per UR Regulation:    If discussed at Long Length of Stay Meetings, dates discussed:   10/04/2012    Comments:  10/05/12.Marland KitchenMarland KitchenOletta Cohn, RN,BSN, [place] [phone] Spoke with pt daughter at bedside as pt was asleep. Daughter states pt will be going to SNF to get stronger. CSW to offer choices to daughter today.  09/28/12 1430 Arlyss Queen, RN BSN CM

## 2012-09-28 NOTE — Progress Notes (Signed)
Inpatient Diabetes Program Recommendations  AACE/ADA: New Consensus Statement on Inpatient Glycemic Control (2013)  Target Ranges:  Prepandial:   less than 140 mg/dL      Peak postprandial:   less than 180 mg/dL (1-2 hours)      Critically ill patients:  140 - 180 mg/dL   Results for LAIBA, FUERTE (MRN [id number]) as of 09/28/2012 09:17  Ref. Range 09/27/2012 20:55  Glucose-Capillary Latest Range: 70-99 mg/dL 409 (H)   Results for ZOELLA, ROBERTI (MRN [id number]) as of 09/28/2012 09:17  Ref. Range 09/27/2012 15:22 09/28/2012 04:47  Glucose Latest Range: 70-99 mg/dL 956 (H) 213 (H)   Inpatient Diabetes Program Recommendations Correction (SSI): Please order CBGs with Novolog sensitive correction ACHS.  Note: Patient has a history of diabetes but according to the home medication list, she is not taking any medication for diabetes management.  Last A1C was 6.0% on 09/16/12.  While inpatient, please order CBGs with Novolog sensitive correction scale ACHS to maintain inpatient glycemic control.    Thanks, Orlando Penner, RN, MSN, CCRN Diabetes Coordinator Inpatient Diabetes Program [phone]

## 2012-09-28 NOTE — Progress Notes (Signed)
INITIAL NUTRITION ASSESSMENT  DOCUMENTATION CODES Per approved criteria  -Not Applicable   INTERVENTION: Glucerna Shake po TID, each supplement provides 220 kcal and 10 grams of protein.  NUTRITION DIAGNOSIS: Decreased sodium needs related to heart failure as evidenced by Acute on chronic systolic heart failure  Goal: Pt to meet >/= 90% of their estimated nutrition needs  Monitor:  Po intake, labs, I/O's and wt trends  Reason for Assessment: Malnutrition Screen = 2  79 y.o. female  Admitting Dx: Acute on chronic systolic heart failure  ASSESSMENT: Pt daughters at bedside and provided hx. She reports pt usual diet Mechanical Soft/ Low sodium, and good appetite prior to acute illness. Usually feeds herself.   Weight loss since admission desirable. Pt receiving diuretics and on daily wts. Will add oral supplement for nutrition support, follow po intake and careplan progression.  Height: Ht Readings from Last 1 Encounters:  09/27/12 5' (1.524 m)    Weight: Wt Readings from Last 1 Encounters:  09/28/12 105 lb 4.8 oz (47.764 kg)    Ideal Body Weight: 100#   % Ideal Body Weight: 105%  Wt Readings from Last 10 Encounters:  09/28/12 105 lb 4.8 oz (47.764 kg)  09/23/12 112 lb (50.803 kg)  09/20/12 112 lb (50.803 kg)  04/19/12 110 lb (49.896 kg)  01/18/12 112 lb (50.803 kg)  07/14/11 106 lb (48.081 kg)    Usual Body Weight: 105-110#  % Usual Body Weight: 100%  BMI:  Body mass index is 20.57 kg/(m^2). normal range  Estimated Nutritional Needs: Kcal: 1200-1440 Protein: 53-62 gr Fluid: per MD goals  Skin: No issues noted  Diet Order: Dysphagia 2  EDUCATION NEEDS: -No education needs identified at this time   Intake/Output Summary (Last 24 hours) at 09/28/12 1334 Last data filed at 09/28/12 1000  Gross per 24 hour  Intake    120 ml  Output   3550 ml  Net  -3430 ml    Last BM: 09/27/12  Labs:   Recent Labs Lab 09/23/12 1545 09/27/12 1522  09/28/12 0447  NA 142 142 143  K 4.5 3.9 4.0  CL 101 104 104  CO2 24 26 29   BUN 18 17 14   CREATININE 0.92 0.90 1.00  CALCIUM 10.4 9.7 9.5  GLUCOSE 78 151* 152*    CBG (last 3)   Recent Labs  09/27/12 2055  GLUCAP 210*    Scheduled Meds: . aspirin  81 mg Oral Daily  . carvedilol  6.25 mg Oral BID WC  . darifenacin  7.5 mg Oral Daily  . enoxaparin (LOVENOX) injection  30 mg Subcutaneous Q24H  . feeding supplement  237 mL Oral TID BM  . furosemide  40 mg Intravenous Q12H  . insulin aspart  0-9 Units Subcutaneous TID WC  . isosorbide mononitrate  60 mg Oral q morning - 10a  . lisinopril  10 mg Oral q morning - 10a  . multivitamin with minerals  1 tablet Oral BH-q7a  . pantoprazole  40 mg Oral q morning - 10a  . potassium chloride SA  20 mEq Oral q morning - 10a  . simvastatin  20 mg Oral QHS  . sodium chloride  3 mL Intravenous Q12H    Continuous Infusions: . sodium chloride 10 mL/hr at 09/27/12 1440    Past Medical History  Diagnosis Date  . Coronary artery disease   . Ventricular tachycardia   . CHF (congestive heart failure)   . GERD (gastroesophageal reflux disease)   . Atrial flutter   .  Sick sinus syndrome   . Dementia   . Diabetes mellitus   . Thrombocytopenia   . Right bundle branch block   . Hyperlipidemia   . TIA (transient ischemic attack)   . Arthritis   . ITP (idiopathic thrombocytopenic purpura) 07/14/2011    Chronic low grade idiopathic thrombocytopenia purpura still not in need of therapy.   Marland Kitchen URI (upper respiratory infection)     history  . Hx: UTI (urinary tract infection)   . Chronic kidney disease   . Pneumonia     Past Surgical History  Procedure Laterality Date  . Coronary angioplasty with stent placement    . Biopsy thyroid      Royann Shivers MS,RD,LDN,CSG Office: #[phone] Pager: [phone]

## 2012-09-28 NOTE — Progress Notes (Signed)
Pt's daughter states pt has hx of right knee pain.  Pt received Tylenol earlier.  Asked about possible warm compresses or pain patch.  Dr. Irene Limbo notified via text page.

## 2012-09-28 NOTE — Progress Notes (Signed)
UR chart review completed.  

## 2012-09-28 NOTE — Progress Notes (Signed)
Pt continues to complain of severe right knee pain.  Dr. Irene Limbo notified via text page and asked if patient have something else for pain relief.  Family at bedside and notified.

## 2012-09-28 NOTE — Progress Notes (Signed)
Dr. Irene Limbo returned page.  No new orders at this time.

## 2012-09-29 ENCOUNTER — Inpatient Hospital Stay (HOSPITAL_COMMUNITY): Payer: Medicare Other

## 2012-09-29 DIAGNOSIS — M79609 Pain in unspecified limb: Secondary | ICD-10-CM

## 2012-09-29 DIAGNOSIS — R0602 Shortness of breath: Secondary | ICD-10-CM

## 2012-09-29 LAB — GLUCOSE, CAPILLARY: Glucose-Capillary: 148 mg/dL — ABNORMAL HIGH (ref 70–99)

## 2012-09-29 LAB — BASIC METABOLIC PANEL
Calcium: 9.3 mg/dL (ref 8.4–10.5)
Creatinine, Ser: 0.8 mg/dL (ref 0.50–1.10)
GFR calc non Af Amer: 63 mL/min — ABNORMAL LOW (ref >90)
Sodium: 137 mEq/L (ref 135–145)

## 2012-09-29 MED ORDER — HEPARIN BOLUS VIA INFUSION
2500.0000 [IU] | Freq: Once | INTRAVENOUS | Status: AC
  Administered 2012-09-29: 2500 [IU] via INTRAVENOUS
  Filled 2012-09-29: qty 2500

## 2012-09-29 MED ORDER — HEPARIN (PORCINE) IN NACL 100-0.45 UNIT/ML-% IJ SOLN
1300.0000 [IU]/h | INTRAMUSCULAR | Status: DC
  Administered 2012-09-29 (×2): 800 [IU]/h via INTRAVENOUS
  Administered 2012-09-30 – 2012-10-03 (×4): 900 [IU]/h via INTRAVENOUS
  Administered 2012-10-04 (×2): 1000 [IU]/h via INTRAVENOUS
  Administered 2012-10-04: 1100 [IU]/h via INTRAVENOUS
  Administered 2012-10-05: 1300 [IU]/h via INTRAVENOUS
  Filled 2012-09-29 (×8): qty 250

## 2012-09-29 MED ORDER — FUROSEMIDE 40 MG PO TABS
40.0000 mg | ORAL_TABLET | Freq: Two times a day (BID) | ORAL | Status: DC
  Administered 2012-09-29 – 2012-10-02 (×5): 40 mg via ORAL
  Filled 2012-09-29 (×8): qty 1

## 2012-09-29 MED ORDER — IOHEXOL 350 MG/ML SOLN
150.0000 mL | Freq: Once | INTRAVENOUS | Status: AC | PRN
  Administered 2012-09-29: 150 mL via INTRAVENOUS

## 2012-09-29 NOTE — Progress Notes (Signed)
Patient being transferred to [facility] via [facility] in stable condition via stretcher.  Family is taking valuables and transporting them to the hospital.  Report given to [facility] nurses and team.

## 2012-09-29 NOTE — Progress Notes (Addendum)
[facility]  Family continues to be indecisive.  I have discussed the ramification of delay again with the family.  They are waiting one additional family member before deciding.  Leonides Sake, MD [facility] [place] Office: [phone] Pager: [phone]  09/29/2012, 10:55 PM    Addendum  After over 1 hour total of discussion with the family, they have elected to proceed with palliative care rather than taking her to the operating room.  They are aware of the consequences of such.  With this patient's advanced age and multiple co-morbidities, I think is a reasonable decision.  If pain control is not adequate, an attempt at AKA or a hip dysarticulation can be considered as a palliative measure.  Leonides Sake, MD [facility] [place] Office: [phone] Pager: [phone]  09/30/2012, 12:07 AM

## 2012-09-29 NOTE — Progress Notes (Signed)
[facility] progress note. Chief complaint. Transfer note. History of present illness. This 79 year old female at [facility] with congestive heart failure and atrial flutter with RVR. Complaining of right knee pain and examination noted her right lower extremity from knee to foot cool to touch. ABIs were obtained and revealed no arterial flow within the right superficial femoral, popliteal, DP and PT arteries. Proximal occlusion or high-grade stenosis suspected. The patient was transferred to [facility] to allow vascular surgery consult. The patient has now arrived and I'm seeing her at bedside to ensure her continued to stability post transfer and ensure her orders have transferred appropriately. Vital signs. Temperature 97.2, pulse 98, respiration 18, blood pressure 97/45. O2 sats 99%. General appearance. Thin frail elderly female who is alert and cooperative. Cardiac. Irregularly irregular. Lungs. Breath sounds clear but reduced with poor inspiratory effort. Abdomen. Soft with positive bowel sounds. No pain. Extremities. Left lower extremity is warm to touch but with nonpalpable DP and PT pulses. Trophic changes noted. Right lower extremity is cool to touch from knee to foot with marked trophic changes noted. Nonpalpable popliteal, DP, PT pulses. Pain with palpation. Impression/plan. Problem #1. Ischemic right lower leg. Patient will be seen in consult by Doctor Imogene Burn of vascular surgery with possible right leg thromboembolectomy with family in the process of considering this procedure at this point. Patient clinically appears stable post transfer and all orders appear to have transferred appropriately.

## 2012-09-29 NOTE — Progress Notes (Signed)
[facility] PROGRESS NOTE  ANICIA LEUTHOLD ZOX:[id number] DOB: 01-Apr-1922 DOA: 09/27/2012 PCP: Cassell Smiles., MD Cardiologist: Dr. Tresa Endo  Assessment/Plan: 1. Acute on chronic systolic congestive heart failure: Continues to improve. Change to oral Lasix. Continue beta blocker and lisinopril. 2. Acute respiratory failure with hypoxia: Appears to be improving. Superimposed on chronic respiratory failure (nighttime oxygen). Wean oxygen as tolerated. Treat heart failure. 3. Atrial flutter with rapid ventricular response: Largely rate controlled at this point. Continue Coreg. 4. History of nonsustained ventricular tachycardia: Continue telemetry. No recurrence documented. 5. Chronic thrombocytopenia: Secondary to ITP, no treatment required per previous documentation. 6. Diabetes mellitus type 2 diet controlled: Hemoglobin A1c 6.08/2012. Continue sliding scale insulin 7. Dementia: Appears stable. 8. Chronic knee pain: Appears stable at this point.   Change to oral Lasix  Wean oxygen  Obtain last cardiology office note  Physical, occupational therapy consults. Anticipate need for skilled nursing facility.  Discussed with daughter/healthcare power of attorney at bedside. We will obtain therapy evaluations. Acute medical issues appear to be improving. Patient is chronically ill her long-term prognosis is guarded.  Code Status: Full code DVT prophylaxis: Lovenox Family Communication: As above Disposition Plan: Return home when improved  Brendia Sacks, MD  [facility]  Pager [phone] If 7PM-7AM, please contact night-coverage at [email or web address], password [facility] 09/29/2012, 11:03 AM  LOS: 2 days   Clinical Summary: 79 year old woman with history of systolic heart failure presented to the emergency department with atrial flutter with rapid ventricular response and severe respiratory distress on CPAP. She was transitioned 4 L nasal cannula in the emergency department. Repeat heart  rate spontaneously improved with treatment of respiratory issues.  Consultants:   Procedures:    HPI/Subjective: Afebrile, vitals stable. Rate overall controlled.Complained of knee pain yesterday, however today it seems to be well-controlled. She has chronic knee pain.   Objective: Filed Vitals:   09/28/12 1735 09/28/12 2112 09/29/12 0500 09/29/12 0654  BP:  101/64  104/65  Pulse:  83  113  Temp:  98 F (36.7 C)  98.5 F (36.9 C)  TempSrc:  Oral  Oral  Resp:  24  20  Height:      Weight:   48.535 kg (107 lb)   SpO2: 100% 100%  100%    Intake/Output Summary (Last 24 hours) at 09/29/12 1103 Last data filed at 09/29/12 0920  Gross per 24 hour  Intake    180 ml  Output    400 ml  Net   -220 ml     Filed Weights   09/27/12 1819 09/28/12 0548 09/29/12 0500  Weight: 47.628 kg (105 lb) 47.764 kg (105 lb 4.8 oz) 48.535 kg (107 lb)    Exam:  General:  Appears calm and comfortable, chronically ill Cardiovascular: RRR, no m/r/g. Periods of rapid heartbeat. No LE edema. Telemetry: Narrow complex irregular rhythm, brief episode of SVT Respiratory: CTA bilaterally, no w/r/r. Normal respiratory effort. Abdomen: soft, ntnd Psychiatric: Appears confused, grossly normal mood and affect. Speech fluent and appropriate. Neurologic: grossly non-focal.  Data Reviewed:  Excellent urine output. Basic metabolic panel unremarkable. Blood sugar control improved.  Pending studies:  None  Scheduled Meds: . aspirin  81 mg Oral Daily  . carvedilol  6.25 mg Oral BID WC  . darifenacin  7.5 mg Oral Daily  . enoxaparin (LOVENOX) injection  30 mg Subcutaneous Q24H  . feeding supplement  237 mL Oral TID BM  . furosemide  40 mg Intravenous Q12H  . insulin aspart  0-9 Units Subcutaneous  TID WC  . isosorbide mononitrate  60 mg Oral q morning - 10a  . lisinopril  10 mg Oral q morning - 10a  . multivitamin with minerals  1 tablet Oral BH-q7a  . pantoprazole  40 mg Oral q morning - 10a  .  potassium chloride SA  20 mEq Oral q morning - 10a  . simvastatin  20 mg Oral QHS  . sodium chloride  3 mL Intravenous Q12H   Continuous Infusions: . sodium chloride 10 mL/hr at 09/27/12 1440    Principal Problem:   Acute on chronic systolic heart failure Active Problems:   Coronary artery disease   GERD (gastroesophageal reflux disease)   Dementia   Diabetes   Atrial flutter   Acute respiratory failure with hypoxia   Time spent 20 minutes

## 2012-09-29 NOTE — Progress Notes (Signed)
Called to see patient this afternoon 1508 for recurrent right knee pain, cool to touch. She has a history of chronic right knee pain per her daughter that flares occasionally and which has been associated with a post-shingles phenomenon and has been managed as an outpatient with Lortab. She had a flare of pain during her last hospitalization that was treated effectively with morphine. She had a flare pain yesterday which was improved with morphine. This morning pain was well-controlled and knee exam was unremarkable.  Approximately 1515 exam right knee was cool as was distal right lower extremity. This is a clear change from previous exams. No dorsalis pedis pulse was palpable bilaterally. Patient had pain with extension of knee, no pain with flexion. She is able to abduct right hip, extend and flex right knee and wiggle right toes. Sensation was intact over knee, calf and foot.   ABIs were obtained urgently and revealed no arterial flow within the right superficial femoral, popliteal, dorsalis pedis and posterior tibial arteries. Proximal occlusion or high-grade stenosis was suspected.  On reexamination 1730 knee is slightly cool as is foot but overall the right lower extremity is warmer. She has exquisite pain with extension of the knee but is able to abduct the hip, flex and extend the knee and wiggle right toes. Sensation the knee, calf, foot is intact.  Case discussed with Dr. Imogene Burn [facility], recommended transfer to [facility] cone, CT angio aorta with bifemoral runoff and heparin infusion. He will see patient in consultation on arrival.  Discussed with family including daughter/healthcare power of attorney, additional daughter, son and other family members. Discussed the urgent need for vascular assessment to discuss further treatment options.  Discussed with [facility] and flow manager and arrangements made for transfer to [facility] Team 8, Dr. Blake Divine.  Brendia Sacks, MD [facility] [phone]

## 2012-09-29 NOTE — Consult Note (Addendum)
[facility] [place]  Referred by:  Dr. Brendia Sacks (Hospitalist @ [facility])  Reason for referral: right leg ischemia  History of Present Illness  [name] is a 79 y.o. ([birth year]) female with multiple active severe co-morbidities who presents with chief complaint: shortness of breath.  She was admitted to hospital after EMS transport to [facility] with oxygen desaturation.  In ED there patient had new onset atrial flutter in the setting of chronic cardiac arhythmia.  Patient has documented increased R knee pain yesterday afternoon.  The patient has a long standing history of right knee pain by report from family.  Acute exacerbation of pain occurred this afternoon with marked change in exam ~1500.  History cannot be obtained from the patient due to altered mental status.  Per family the patient has a history of venous stasis, unclear if just right leg or left leg.  The patient has not be ambulatory for years and is wheelchair bound at home.  She lives with her family currently.  Past Medical History  Diagnosis Date  . Coronary artery disease   . Ventricular tachycardia   . CHF (congestive heart failure)   . GERD (gastroesophageal reflux disease)   . Atrial flutter   . Sick sinus syndrome   . Dementia   . Diabetes mellitus   . Thrombocytopenia   . Right bundle branch block   . Hyperlipidemia   . TIA (transient ischemic attack)   . Arthritis   . ITP (idiopathic thrombocytopenic purpura) 07/14/2011    Chronic low grade idiopathic thrombocytopenia purpura still not in need of therapy.   Marland Kitchen URI (upper respiratory infection)     history  . Hx: UTI (urinary tract infection)   . Chronic kidney disease   . Pneumonia     Past Surgical History  Procedure Laterality Date  . Coronary angioplasty with stent placement    . Biopsy thyroid      History   Social History  . Marital Status: Widowed    Spouse Name: N/A    Number of Children: N/A  . Years of Education: N/A    Occupational History  . Not on file.   Social History Main Topics  . Smoking status: Never Smoker   . Smokeless tobacco: Never Used  . Alcohol Use: No  . Drug Use: No  . Sexually Active:    Other Topics Concern  . Not on file   Social History Narrative  . No narrative on file    Family History  Cannot be obtained due to patient's altered mental status and family lack of familiarity with such.   No current facility-administered medications on file prior to encounter.   Current Outpatient Prescriptions on File Prior to Encounter  Medication Sig Dispense Refill  . aspirin 81 MG tablet Take 81 mg by mouth every morning.       Marland Kitchen guaiFENesin (MUCINEX) 600 MG 12 hr tablet Take 600 mg by mouth every morning.        . isosorbide mononitrate (IMDUR) 60 MG 24 hr tablet Take 60 mg by mouth every morning.       Marland Kitchen lisinopril (PRINIVIL,ZESTRIL) 10 MG tablet Take 10 mg by mouth every morning.       . Multiple Vitamins-Iron (MULTIVITAMIN/IRON) TABS Take 1 tablet by mouth every morning.        . nitroGLYCERIN (NITROSTAT) 0.4 MG SL tablet Place 0.4 mg under the tongue every 5 (five) minutes as needed.      Marland Kitchen  pantoprazole (PROTONIX) 40 MG tablet Take 40 mg by mouth every morning.       . potassium chloride SA (K-DUR,KLOR-CON) 20 MEQ tablet Take 20 mEq by mouth every morning.       . simvastatin (ZOCOR) 20 MG tablet Take 20 mg by mouth at bedtime.       . solifenacin (VESICARE) 5 MG tablet Take 5 mg by mouth every morning.       . [DISCONTINUED] fexofenadine (ALLEGRA) 60 MG tablet Take 180 mg by mouth daily as needed. allergies         No Known Allergies   REVIEW OF SYSTEMS:  (Positives checked otherwise negative)  Cannot be obtained due to patient's altered mental status  For VQI Use Only  PRE-ADM LIVING: Home  AMB STATUS: Wheelchair  CAD Sx: None  PRIOR CHF: Asymptomatic, History of CHF  STRESS TEST: [x]  No, [ ]  Normal, [ ]  + ischemia, [ ]  + MI, [ ]  Both  Physical  Examination  Filed Vitals:   09/29/12 1512 09/29/12 1838 09/29/12 1953 09/29/12 2148  BP: 101/66 90/59 88/41  97/45  Pulse: 115 105 102 98  Temp: 97.4 F (36.3 C) 98 F (36.7 C) 97.5 F (36.4 C) 97.2 F (36.2 C)  TempSrc: Axillary Oral Oral Oral  Resp: 20 20 20 18   Height:      Weight:    95 lb 12.8 oz (43.455 kg)  SpO2: 97% 100% 100% 99%   Body mass index is 18.71 kg/(m^2).  General: awake to stimulation, non-communicative, cachectic   Head: [place]/AT, Temporalis wasting  Ear/Nose/Throat: nares w/o erythema or drainage, oropharynx w/o Erythema/Exudate, Mallampati score: 2, cannot test hearing  Eyes: Unable to coop w/ exam due to altered mental status  Neck: Supple, no nuchal rigidity, no palpable LAD  Pulmonary: Sym exp, good air movt, CTAB, no rhonchi, & wheezing, + rales  Cardiac: no Murmurs, rubs or gallops, Irregularly, irregular rhythm and rate  Vascular: Vessel Right Left  Radial faintly Palpable faintly Palpable  Ulnar not Palpable not Palpable  Brachial faintly Palpable faintly Palpable  Carotid Palpable, without bruit Palpable, without bruit  Aorta Not palpable N/A  Femoral Not Palpable Faintly Palpable  Popliteal Not palpable Not palpable  PT Not Palpable Not Palpable  DP Not Palpable Faintly Palpable   Gastrointestinal: soft, NTND, -G/R, - HSM, - masses, - CVAT B  Musculoskeletal: unable to test motor as patient unable to cooperate, she is able to move hips muscle on right to raise right foot, no DF or PF (unclear if not able to cooperate due to altered mental status), R leg with warm proximal thigh and rest of leg cool, bilateral chronic skin hyperpigementation in the lower legs (R>>L), no edema, L calf warmer than R, limited degree of R foot drop already  Neurologic: unable to get due to altered mental status  Psychiatric: unable to get due to altered mental status  Dermatologic: See M/S exam for extremity exam, no rashes otherwise noted  Lymph : No  Cervical, Axillary, or Inguinal lymphadenopathy   Non-Invasive Vascular Imaging  BLE ABI with physiologic studies (Date: 09/29/2012)  Normal left ABI 0.97.   No arterial flow could be detected within the right superficial femoral, popliteal, dorsalis pedis, or posterior tibial arteries.   Findings either represent right lower extremity proximal arterial occlusion or high-grade stenosis with trickle flow which could not be detected.   Laboratory: CBC:    Component Value Date/Time   WBC 9.5 09/28/2012 0447   RBC  4.34 09/28/2012 0447   HGB 12.6 09/28/2012 0447   HCT 38.4 09/28/2012 0447   PLT 130* 09/28/2012 0447   MCV 88.5 09/28/2012 0447   MCH 29.0 09/28/2012 0447   MCHC 32.8 09/28/2012 0447   RDW 15.1 09/28/2012 0447   LYMPHSABS 0.6* 09/27/2012 1522   MONOABS 0.8 09/27/2012 1522   EOSABS 0.0 09/27/2012 1522   BASOSABS 0.0 09/27/2012 1522    BMP:    Component Value Date/Time   NA 137 09/29/2012 0450   K 3.5 09/29/2012 0450   CL 97 09/29/2012 0450   CO2 33* 09/29/2012 0450   GLUCOSE 111* 09/29/2012 0450   BUN 15 09/29/2012 0450   CREATININE 0.80 09/29/2012 0450   CALCIUM 9.3 09/29/2012 0450   GFRNONAA 63* 09/29/2012 0450   GFRAA 74* 09/29/2012 0450    Coagulation: Lab Results  Component Value Date   INR 1.13 09/16/2012   INR 1.18 05/06/2010   INR 1.01 12/23/2009   No results found for this basename: PTT    Cardiac Panel (last 3 results) No results found for this basename: CKTOTAL, CKMB, TROPONINI, RELINDX,  in the last 72 hours  Radiology: Ct Angio Ao+bifem W/cm &/or Wo/cm  09/29/2012   *RADIOLOGY REPORT*  Clinical Data:  Right leg pain, suspected ischemia  CT ANGIOGRAPHY OF ABDOMINAL AORTA WITH ILIOFEMORAL RUNOFF  Technique:  Multidetector CT imaging of the abdomen, pelvis and lower extremities was performed using the standard protocol during bolus administration of intravenous contrast.  Multiplanar CT image reconstructions including MIPs were obtained to evaluate the vascular  anatomy.  Contrast: OMNIPAQUE IOHEXOL 350 MG/ML SOLN  Comparison:  Noninvasive lower extremity vascular evaluation - earlier same day; chest radiograph - 09/27/2012  Vascular Findings:  Evaluation of the abdominal vasculature is limited secondary to patient motion artifact.  Imaged thoracic aorta: Several tiny (2 mm) penetrated atherosclerotic ulcers are noted within distal aspect of the descending thoracic aorta (images 16, 17 and 20). No periaortic stranding.  Abdominal aorta:  There is scattered mixed slightly irregular mixed calcified and noncalcified atherosclerotic plaque within a normal caliber abdominal aorta.  No abdominal aortic dissection or periaortic stranding.  Celiac artery:  There is a mixture of mixed calcified and noncalcified plaque involving the origin of the celiac artery not definitely resulting in hemodynamically significant stenosis.  SMA:  There is mixed calcified and noncalcified plaque involving the origin and proximal aspect of the SMA which likely approaches 40% luminal narrowing (images 42 and 43, series 5).  Right renal artery:  Duplicated codominant right-sided renal artery is are patent but diminutive throughout their course.  There is suspected narrowing of the origin of both right-sided renal arteries.  Left renal artery:  Solitary; diminutive throughout its course but patent.  IMA:  Patent.  -----------------------------------------------------------  Right Lower Extremity:  Pelvic vasculature:  There is scattered mixed calcified and noncalcified plaque within the right common iliac artery, not resulting in hemodynamically significant stenosis.  The right internal iliac artery is mildly ectatic measuring approximately 7 mm in diameter proximally (image 88, series five).  The right external iliac artery is widely patent throughout its course.  Right common femoral artery:  There is a near occlusive filling defect throughout the entirety the common femoral artery. The vessel  appears mildly expanded at this level suggestive of embolism.  Right deep femoral artery:  There is mixed occlusive or nonocclusive thrombus throughout the proximal aspect of the right deep femoral artery.  Right superficial femoral artery:  There is proximally  occlusive and distally partially occlusive thrombus throughout the entirety of the right superficial femoral artery.  Right popliteal artery:  There is near occlusive thrombus throughout the entirety of the right above and below knee popliteal arteries.  Right lower leg:  The anterior tibial artery occludes shortly after its origin.  There is a two vessel runoff to the lower leg however both the posterior tibial and peroneal arteries appear to occlude above the level of the malleoli.  A patent right-sided dorsalis pedis artery is not identified.  -------------------------------------------------------------  Left Lower Extremity:  Pelvic vasculature:  There is mixed calcified and noncalcified plaque involving the origin of the left external iliac artery resulting in approximately 70% luminal narrowing (axial image 73, series 5, coronal image 50, severe 8).  This finding is associated with mild post stenotic dilatation with the left external iliac artery measuring approximately 1.3 cm in greatest short axis diameter (image 76, series five).  The left internal iliac artery is diseased but patent and of normal caliber.  There is a minimal amount of eccentric atherosclerotic plaque with the distal aspect of the left external iliac artery, not resulting in hemodynamically significant stenosis.  Left common femoral artery:  Widely patent without hemodynamically significant narrowing.  Left deep femoral artery:  Widely patent.  Left superficial femoral artery:  There is a short segment severe (rate of 70%) narrowing of the distal aspect of the left superficial femoral artery at the level of the abductor canal (images 194 - 196). There is scattered eccentric mixed  calcified and noncalcified plaque throughout the area of the proximal mid aspect of the left superficial femoral artery, not resulting in hemodynamically significant narrowing.  Left popliteal artery:  Evaluation degraded secondary to patient motion artifact.  There is scattered mixed eccentric calcified and noncalcified plaque primarily involving the above-knee popliteal artery, not definitely resulting in hemodynamically significant narrowing.  Left lower leg:  There is a minimal amount of eccentric calcified plaque involving the proximal aspect the left anterior tibial artery (image 273, series five).  There is a three-vessel runoff to the left foot.  A patent dorsalis pedis artery is identified to the level of the hind foot.  Review of the MIP images confirms the above findings.  ------------------------------------------------------------------- -----  Nonvascular findings:  Evaluation of the abdominal organs is limited to the arterial phase of enhancement. Evaluation of the abdominal organs is degraded secondary to patient motion artifact.  Normal hepatic contour.  There is a peripheral nodular enhancing approximately 2.1 x 1.6 cm hemangioma within the left lobe of the liver (image 43, series five) as well as a similarly appearing approximately 2.3 x 2.2 cm hemangioma within the dome of the right lobe of the liver (coronal images 48 and 49, series 7). Normal appearance of the gallbladder.  No intra or extrahepatic biliary ductal dilatation.  No ascites.  There is a geographic area of apparent wedge shaped perfusion involving the posterior superior aspect of the left kidney (image 40).  Bilateral sub centimeter hypoattenuating lesions too small to accurate characterize of favored to represent renal cysts.  No definite renal stones on the post contrast examination.  No urinary obstruction or perinephric stranding.  Note is made of a sub centimeter (approximately 8 mm) nodule within the medial limb of the right  adrenal gland.  Too small to accurately characterize.  Normal appearance of the left adrenal gland.  There is an approximately 1.2 x 1.5 cm cyst within the lower spleen (image 32, series five).  The pancreatic duct is dilated regional to the head of the pancreas measuring approximately 8 mm in diameter (image 50, series 5 though this finding is without discrete pancreatic atrophy given the patient age.  The bowel is normal in course and caliber without a discrete area of wall thickening or obstruction.  Normal appearance of the appendix.  No pneumoperitoneum, pneumatosis or portal venous gas. No definite retroperitoneal, mesenteric, pelvic or inguinal lymphadenopathy.  Normal appearance of the pelvic organs for age. No free fluid within the pelvis.  Limited visualization of the lower thorax demonstrates bibasilar heterogeneous air space opacities, right greater than left.  Note is made of a small right-sided pleural effusion. There is a geographic approximately 1.9 x 1.3 cm area of decreased attenuation within the right lower lobe adjacent to the costophrenic angle (image 11, series 5, coronal image 38, series seven) which may represent a small area of developing pulmonary abscess.  Cardiomegaly, in particular, there is marked enlargement of the right side of the heart.  Coronary calcifications.  No pericardial effusion.  IMPRESSION:  1.  There is presumed embolism within the right common femoral artery extending to involve their entirety of the right superficial femoral and popliteal arteries as well as the proximal aspect of the right deep femoral artery resulting in multifocal occlusive and partially occlusive flow to the right lower extremity.  There is an atretic two-vessel runoff to the level of the malleoli via the posterior tibial and peroneal arteries.  2.  There is an approximately 2.7 x 2.2 cm geographic region of decreased attenuation within the posterior superior aspect of the left kidney.  Differential  considerations include a focal area of pyelonephritis versus an underlying renal mass, though in current clinical setting, renal infarct is suspected.  3.  No definite evidence of embolism to the left lower extremity, though there is an approximately 70% narrowing involving the proximal aspect of the left common iliac artery with associated mild post stenotic dilatation as well as short segment severe narrowing of the distal aspect of the left superficial femoral artery at the level of the abductor canal.  Three-vessel runoff to the left foot.  The left-sided dorsalis pedis artery is identified to the level of the hind foot.  4.  Scattered irregular atherosclerotic plaque within a mildly tortuous but normal caliber abdominal aorta.  No discrete filling defects within the abdominal aorta.  5.  Cardiomegaly, in particular, there is marked enlargement of the right side of the heart.  In the setting of presumed distal embolism, further evaluation with cardiac echo is recommended.  6.  Air space opacities within the imaged bilateral lower lobes worrisome for multifocal infection with possible approximately 1.9 cm area of relative decreased attenuation within the right lower lobe worrisome for a developing pulmonary parenchymal abscess and associated small right-sided parapneumonic pleural effusion.  7.  Nonspecific pancreatic ductal dilatation, the etiology of which is not detected on this examination.  Further evaluation with non emergent MRCP may be obtained as clinically indicated.  Above findings discussed with Dr. Rubin Payor at 2018.   Original Report Authenticated By: Tacey Ruiz, MD   US Arterial Seg Single  09/29/2012   *RADIOLOGY REPORT*  Clinical Data:  Right leg pain and coldness, rest pain, history diabetes, hyperlipidemia, CHF, ITP, coronary artery disease  ULTRASOUND ANKLE/BRACHIAL INDICES BILATERAL  Comparison: 11/28/2004  Findings: Following pressures obtained, in mm Hg: Left brachial:          127  Left dorsalis pedis:  123 Left posterior tibial:  no flow detected Left ABI:               0.97  Right brachial:         118 Right dorsalis pedis:   no flow detected Right posterior tibial: no flow detected Right ABI:              N/A  Unable to obtain Doppler wave forms at the right superficial femoral or popliteal arteries as well.  IMPRESSION: Normal left ABI 0.97. No arterial flow could be detected within the right superficial femoral, popliteal, dorsalis pedis, or posterior tibial arteries. Findings either represent right lower extremity proximal arterial occlusion or high-grade stenosis with trickle flow which could not be detected.  Critical Value/emergent results were called by telephone at the time of interpretation on 09/29/2012 at 1710 hours to Dr. Irene Limbo, who verbally acknowledged these results.   Original Report Authenticated By: Ulyses Southward, M.D.   Based on my review of the CTA aorta with B runoff, this patient has an arterial embolism from the right  CFA extending throughout the right arterial system.  The calcifications evidence also suggest chronic PAD in this patient.  There is profunda flow present with a wisp of some tibial flow likely from collaterals.   Medical Decision Making  [name] is a 79 y.o. female who presents with: Acute on chronic PAD with acute arterial embolism to R leg, chronic LLE PAD, chronic cardiac arrhythmia with compromised output, dementia, non-ambulatory status, respiratory compromise of multiple etiology, and other multiple active co-morbidities list above   Unfortunately, I suspect the acute event occurred over 24 hours ago with possible additional event this afternoon.  Based on documented time, the patient is already out of the 6 hour limb salvage window.  For this same reason, I don't think thrombolysis is timely option.  I have a suspicion that some degree of muscle infarction is already occurring in the right leg given the foot drop.  I  already suspect a R BKA will be needed.  Unfortunately without at least right femoral thrombembolectomy even a right AKA may be at risk for failure.  I offered the family a R leg thromboembolectomy.  They are considering the procedure at this point. The risk, benefits, and alternative for a thromboembolectomy were discussed with the patient's family as she is not mentally intact to give consent. The family is aware aware the risks include but are not limited to: bleeding, infection, myocardial infarction, stroke, limb loss, nerve damage, need for additional procedures in the future, wound complications, and inability to complete the thromboembolectomy.  I have emphasized to the family the importance of timing in this case.  Given this patient multiple severe co-morbidities, I also feel that pursuing a palliative approach would be appropriate.  Thank you for allowing [place] to participate in this patient's care.  Leonides Sake, MD [facility] [place] Office: [phone] Pager: [phone]  09/29/2012, 10:05 PM

## 2012-09-29 NOTE — Progress Notes (Signed)
Report called to Pattricia Boss, RN at [facility].

## 2012-09-29 NOTE — Progress Notes (Addendum)
Patient did not swallow all medications, unsure which meds not swallowed due to some meds being crushed in applesauce. Dr. Irene Limbo notified.

## 2012-09-29 NOTE — Progress Notes (Addendum)
ANTICOAGULATION CONSULT NOTE - Initial Consult  Pharmacy Consult for Heparin Indication: ischemic leg  No Known Allergies  Patient Measurements: Height: 5' (152.4 cm) Weight: 107 lb (48.535 kg) IBW/kg (Calculated) : 45.5  Vital Signs: Temp: 97.4 F (36.3 C) (06/12 1512) Temp src: Axillary (06/12 1512) BP: 101/66 mmHg (06/12 1512) Pulse Rate: 115 (06/12 1512)  Labs:  Recent Labs  09/27/12 1522 09/28/12 0447 09/29/12 0450  HGB 13.2 12.6  --   HCT 39.9 38.4  --   PLT 129* 130*  --   CREATININE 0.90 1.00 0.80    Estimated Creatinine Clearance: 34.2 ml/min (by C-G formula based on Cr of 0.8).   Medical History: Past Medical History  Diagnosis Date  . Coronary artery disease   . Ventricular tachycardia   . CHF (congestive heart failure)   . GERD (gastroesophageal reflux disease)   . Atrial flutter   . Sick sinus syndrome   . Dementia   . Diabetes mellitus   . Thrombocytopenia   . Right bundle branch block   . Hyperlipidemia   . TIA (transient ischemic attack)   . Arthritis   . ITP (idiopathic thrombocytopenic purpura) 07/14/2011    Chronic low grade idiopathic thrombocytopenia purpura still not in need of therapy.   Marland Kitchen URI (upper respiratory infection)     history  . Hx: UTI (urinary tract infection)   . Chronic kidney disease   . Pneumonia     Medications:  Scheduled:  . aspirin  81 mg Oral Daily  . carvedilol  6.25 mg Oral BID WC  . darifenacin  7.5 mg Oral Daily  . enoxaparin (LOVENOX) injection  30 mg Subcutaneous Q24H  . feeding supplement  237 mL Oral TID BM  . furosemide  40 mg Oral BID  . insulin aspart  0-9 Units Subcutaneous TID WC  . isosorbide mononitrate  60 mg Oral q morning - 10a  . lisinopril  10 mg Oral q morning - 10a  . multivitamin with minerals  1 tablet Oral [id number]  . pantoprazole  40 mg Oral q morning - 10a  . potassium chloride SA  20 mEq Oral q morning - 10a  . simvastatin  20 mg Oral QHS  . sodium chloride  3 mL Intravenous  Q12H    Assessment: [name] who was admitted to [facility] 09/27/12 with HF exacerbation has now developed ischemia of right leg.   She is being transferred to [facility] for vascular evaluation.  She has been on Lovenox for VTE prophylaxis since admission - last dose was ~22hrs ago.  This is being changed to full-dose heparin.  No bleeding noted.  Her baseline platelet count is low, but has been stable.  Goal of Therapy:  Heparin level 0.3-0.7 units/ml Monitor platelets by anticoagulation protocol: Yes   Plan:  Heparin 2500 units bolus followed by continuous infusion at 800 units/hr Check 8 hr heparin level Daily heparin level & CBC while on heparin  Elson Clan 09/29/2012,6:13 PM

## 2012-09-29 NOTE — Progress Notes (Signed)
PT Cancellation Note  Patient Details Name: [name] MRN: [id number] DOB: 06-27-1921   Cancelled Treatment:    Reason Eval/Treat Not Completed: Medical issues which prohibited therapy Several attempts have been made to see pt today.  Earlier, she was too groggy to work with me.  I tried to see her at about 2:45 and pt was more alert but having significant pain primarily in the RLE.  She had pain with gentle palpation of her knee, calf and foot.  Her R calf and foot are very cold whereas the LLE is  warm to the touch.  Pt was clearly having intense pain and unable to work with me.  I spoke with RN who planned to discuss with MD.  Konrad Penta 09/29/2012, 3:42 PM

## 2012-09-29 NOTE — Progress Notes (Signed)
Patient awake moaning and grabbing right knee, assessed knee, cool to touch and sensitive. Morphine was dc'd this AM, unable to give patient Vicodin due to patient not swallowing medications. Dr. Irene Limbo notified.

## 2012-09-30 DIAGNOSIS — I5032 Chronic diastolic (congestive) heart failure: Secondary | ICD-10-CM

## 2012-09-30 DIAGNOSIS — I70269 Atherosclerosis of native arteries of extremities with gangrene, unspecified extremity: Secondary | ICD-10-CM

## 2012-09-30 DIAGNOSIS — I2510 Atherosclerotic heart disease of native coronary artery without angina pectoris: Secondary | ICD-10-CM

## 2012-09-30 LAB — CBC
HCT: 40.9 % (ref 36.0–46.0)
MCH: 28.8 pg (ref 26.0–34.0)
MCHC: 33.5 g/dL (ref 30.0–36.0)
MCV: 86.1 fL (ref 78.0–100.0)
Platelets: 141 10*3/uL — ABNORMAL LOW (ref 150–400)
RDW: 14.9 % (ref 11.5–15.5)
WBC: 8.8 10*3/uL (ref 4.0–10.5)

## 2012-09-30 LAB — GLUCOSE, CAPILLARY: Glucose-Capillary: 241 mg/dL — ABNORMAL HIGH (ref 70–99)

## 2012-09-30 MED ORDER — MORPHINE SULFATE 2 MG/ML IJ SOLN
1.0000 mg | INTRAMUSCULAR | Status: DC | PRN
  Administered 2012-09-30: 1 mg via INTRAVENOUS
  Filled 2012-09-30: qty 1

## 2012-09-30 NOTE — Progress Notes (Signed)
Pt.arrived to unit around 2140. She was transported by stretcher with IV pole, foley catheter and oxygen 2 L [place].She is alert and oriented to self, disoriented to time, place, and situation. Patient had continuous heparin infusion running when she arrived to the floor. Pt.unable to ambulate due to weakness and right lower left discomfort. Son was also at pt.'s beside. MD came to bedside to discuss pt.concerns and possible procedure with family; decision was made. Diet changed from NPO to now carb modified. Patient is resting in bed. No signs/complaints of pain.

## 2012-09-30 NOTE — Progress Notes (Signed)
[facility] [place]  Subjective  - No change in status. Sitting up in chair and appears comfortable.   Objective 111/56 101 97.4 F (36.3 C) (Axillary) 19 99%  Intake/Output Summary (Last 24 hours) at 09/30/12 0936 Last data filed at 09/30/12 0920  Gross per 24 hour  Intake    300 ml  Output   1800 ml  Net  -1500 ml   Vascular right lower extremity from knee down is cold to touch.  Leg is dark and thin in appearance. No open wounds on limb.    Assessment/Planning: Acute on chronic PAD with acute embolism to R CFA, multiple active co-morbidities. Family elected to proceed with a palliative approach, recommend getting Palliative Care consult     Clinton Gallant [facility] 09/30/2012 9:36 AM --  Laboratory Lab Results:  Recent Labs  09/28/12 0447 09/30/12 0515  WBC 9.5 8.8  HGB 12.6 13.7  HCT 38.4 40.9  PLT 130* 141*   BMET  Recent Labs  09/28/12 0447 09/29/12 0450  NA 143 137  K 4.0 3.5  CL 104 97  CO2 29 33*  GLUCOSE 152* 111*  BUN 14 15  CREATININE 1.00 0.80  CALCIUM 9.5 9.3    COAG Lab Results  Component Value Date   INR 1.13 09/16/2012   INR 1.18 05/06/2010   INR 1.01 12/23/2009   No results found for this basename: PTT

## 2012-09-30 NOTE — Progress Notes (Signed)
Patient ZO:XWRU A Noah      DOB: July 10, 1921      [id number]   Family  Returned call this evening. will be meeting for goals of care at 9 am tomorrow 6/13   Addilynn Mowrer L. Ladona Ridgel, MD MBA The Palliative Medicine Team at [facility] Phone: [phone] Pager: [phone]

## 2012-09-30 NOTE — Progress Notes (Signed)
ANTICOAGULATION CONSULT NOTE - Follow-Up Consult  Pharmacy Consult for Heparin Indication: ischemic leg  No Known Allergies  Patient Measurements: Height: 5' (152.4 cm) Weight: 94 lb 5.7 oz (42.8 kg) IBW/kg (Calculated) : 45.5  Vital Signs: Temp: 97.3 F (36.3 C) (06/13 1507) Temp src: Axillary (06/13 1507) BP: 90/58 mmHg (06/13 1507) Pulse Rate: 98 (06/13 1507)  Labs:  Recent Labs  09/28/12 0447 09/29/12 0450 09/30/12 0515 09/30/12 1421  HGB 12.6  --  13.7  --   HCT 38.4  --  40.9  --   PLT 130*  --  141*  --   HEPARINUNFRC  --   --  0.23* 0.48  CREATININE 1.00 0.80  --   --     Estimated Creatinine Clearance: 32.2 ml/min (by C-G formula based on Cr of 0.8).  Assessment: [name] who was admitted to [facility] 09/27/12 with HF exacerbation. Pt developed acute arterial embolism to R leg. Transferred to [facility] 6/13 for vascular evaluation. Family/patient have decided to do palliative care rather than proceeding to OR. Heparin level 0.48 this p.m. - therapeutic on 900 units/hr. No bleeding noted. Plt count improving. H/H stable.  Goal of Therapy:  Heparin level 0.3-0.7 units/ml Monitor platelets by anticoagulation protocol: Yes   Plan:  1) Continue heparin gtt to 900 units/hr 2) Follow-up heparin level in AM 3) Monitor for signs and symptoms of bleeding  Link Snuffer, PharmD, BCPS Clinical Pharmacist [phone]  09/30/2012,3:28 PM

## 2012-09-30 NOTE — Progress Notes (Signed)
Pt daughter asst in med giving Pt not taking from RN at this time. Med were crushed and put in ensure puttn per daughter that is how they do it at home.

## 2012-09-30 NOTE — Progress Notes (Signed)
Foley removed Peri care done

## 2012-09-30 NOTE — Progress Notes (Signed)
Patient WU:JWJX A [place]      DOB: Jul 29, 1921      BJY:[id number]  Consult received for GOC meeting .  Spoke with patient's daughter Natalia Leatherwood. She will be speaking with her brother who is on his way into town and call our phone to schedule as early as tomorrow (Saturday 6/14).  Patient appears comfortable at this time.  Monquie Fulgham L. Ladona Ridgel, MD MBA The Palliative Medicine Team at [facility] Phone: [phone] Pager: [phone]

## 2012-09-30 NOTE — Progress Notes (Signed)
PT Cancellation Note  Patient Details Name: [name] MRN: [id number] DOB: [birth year]   Cancelled Treatment:    Reason Eval/Treat Not Completed: Other (comment) (Pt sleeping and family request to allow pt to sleep. )  Discussed in length with family about d/c planning and options for pt.  Family interested in SNF option. Family and pt meeting with palliative care tomorrow to address goals of care.  Will plan to see pt if indicated in goals of care tomorrow.    [name] 09/30/2012, 5:04 PM  Jake Shark, PT DPT [phone]

## 2012-09-30 NOTE — Progress Notes (Signed)
[facility] [place]  Daily Progress Note  Assessment/Planning: Acute on chronic PAD with acute embolism to R CFA, multiple active co-morbidities   Family elected to proceed with a palliative approach, recommend getting Palliative Care consult.  I would continue with Heparin drip to help prevent further progression of thrombosis.  The question of coumadin will depend ultimately on the family plan as formulated with Palliative Care  No need to immediately consider amputation: both R AKA and hip dysarticulation remain feasible.  Dr. Hart Rochester will be covering for me over the weekend if any questions should develop.  Subjective   comfortable  Objective Filed Vitals:   09/29/12 2148 09/30/12 0140 09/30/12 0500 09/30/12 0534  BP: 97/45 89/64  92/61  Pulse: 98 112  100  Temp: 97.2 F (36.2 C)   97.4 F (36.3 C)  TempSrc: Oral   Axillary  Resp: 18 18  18   Height:      Weight: 95 lb 12.8 oz (43.455 kg)  94 lb 5.7 oz (42.8 kg) 94 lb 5.7 oz (42.8 kg)  SpO2: 99% 100%  96%    Intake/Output Summary (Last 24 hours) at 09/30/12 0754 Last data filed at 09/29/12 1730  Gross per 24 hour  Intake    240 ml  Output   1600 ml  Net  -1360 ml    PULM  B rales CV  Irr, irr VASC  R thigh warm with coolness starting at distal thigh, contraction developing in R knee, dessicated R leg appearance  Laboratory CBC    Component Value Date/Time   WBC 8.8 09/30/2012 0515   HGB 13.7 09/30/2012 0515   HCT 40.9 09/30/2012 0515   PLT 141* 09/30/2012 0515    BMET    Component Value Date/Time   NA 137 09/29/2012 0450   K 3.5 09/29/2012 0450   CL 97 09/29/2012 0450   CO2 33* 09/29/2012 0450   GLUCOSE 111* 09/29/2012 0450   BUN 15 09/29/2012 0450   CREATININE 0.80 09/29/2012 0450   CALCIUM 9.3 09/29/2012 0450   GFRNONAA 63* 09/29/2012 0450   GFRAA 74* 09/29/2012 0450    Leonides Sake, MD [facility] [place] Office: [phone] Pager:  [phone]  09/30/2012, 7:54 AM

## 2012-09-30 NOTE — Evaluation (Signed)
Occupational Therapy Evaluation Patient Details Name: [name] MRN: [id number] DOB: [birth year] Today's Date: 09/30/2012 Time: 4098-1191 OT Time Calculation (min): 37 min  OT Assessment / Plan / Recommendation Clinical Impression  Pt is a 79 yr old female admitted for RLE pain and ischemia.  She also has history of dementia and prior to admission was needing assist with all selfcare tasks and 24 hour assist.  Pt will benefit from acute care OT to help increase independence with basic selfcare tasks.  She currently needs total assist to total assist + 2 for most selfcare tasks.  Will need SNF for follow-up therapy.      OT Assessment  Patient needs continued OT Services    Follow Up Recommendations  SNF    Barriers to Discharge Decreased caregiver support    Equipment Recommendations  None recommended by OT       Frequency  Min 2X/week    Precautions / Restrictions Precautions Precautions: Fall Restrictions Weight Bearing Restrictions: No   Pertinent Vitals/Pain Pain 2/10 on the faces scale, O2 sats 96% on room air at rest    ADL  Eating/Feeding: Performed;+1 Total assistance Where Assessed - Eating/Feeding: Chair Grooming: Performed;Maximal assistance;Wash/dry face Where Assessed - Grooming: Unsupported sitting Upper Body Bathing: Simulated;Maximal assistance Where Assessed - Upper Body Bathing: Unsupported sitting Lower Body Bathing: Simulated;+2 Total assistance Lower Body Bathing: Patient Percentage: 20% Where Assessed - Lower Body Bathing: Supported sit to stand Upper Body Dressing: Simulated;Maximal assistance Where Assessed - Upper Body Dressing: Unsupported sitting Lower Body Dressing: Simulated;+2 Total assistance Lower Body Dressing: Patient Percentage: 20% Where Assessed - Lower Body Dressing: Supported sit to stand Toilet Transfer: Simulated;+2 Total assistance Toilet Transfer: Patient Percentage: 20% Statistician Method: Retail banker: Other (comment) (to bedside chair) Toileting - Clothing Manipulation and Hygiene: Simulated;+2 Total assistance Toileting - Clothing Manipulation and Hygiene: Patient Percentage: 20% Where Assessed - Glass blower/designer Manipulation and Hygiene: Other (comment) (sit to stand from EOB) Tub/Shower Transfer Method: Not assessed Transfers/Ambulation Related to ADLs: Pt needed total assist +2 for for stand pivot transfer to bedside chair ADL Comments: Pt with very little initiation or response to verbal questioning durin session.  She was able to sit EOB for 10 mins with min assist but maintains a posterior pelvic tilt with thoracic kyphosis and cervical flexion.  Max assist to initiate washing her face with the right UE.      OT Diagnosis: Generalized weakness;Cognitive deficits;Acute pain  OT Problem List: Decreased strength;Decreased range of motion;Decreased activity tolerance;Decreased safety awareness;Decreased cognition;Pain;Decreased knowledge of use of DME or AE;Impaired balance (sitting and/or standing) OT Treatment Interventions: Self-care/ADL training;Therapeutic activities;Patient/family education;Therapeutic exercise;DME and/or AE instruction;Balance training   OT Goals Acute Rehab OT Goals OT Goal Formulation: With patient Time For Goal Achievement: 10/14/12 Potential to Achieve Goals: Good ADL Goals Pt Will Perform Eating: with supervision;Sitting, chair ADL Goal: Eating - Progress: Goal set today Pt Will Perform Grooming: with supervision;Sitting, edge of bed;Unsupported ADL Goal: Grooming - Progress: Goal set today Pt Will Perform Upper Body Bathing: with supervision;Unsupported;Sitting, edge of bed ADL Goal: Upper Body Bathing - Progress: Goal set today Pt Will Perform Lower Body Bathing: with max assist;Sit to stand from bed ADL Goal: Lower Body Bathing - Progress: Goal set today Pt Will Transfer to Toilet: with mod assist;Stand pivot transfer;3-in-1 ADL Goal:  Toilet Transfer - Progress: Goal set today Miscellaneous OT Goals Miscellaneous OT Goal #1: Pt will increase bilateral shoulder strength to 3/5 for greater independence  with grooming tasks. OT Goal: Miscellaneous Goal #1 - Progress: Goal set today  Visit Information  Last OT Received On: 09/30/12 Assistance Needed: +2    Subjective Data  Subjective: Pt mostly nodding head yes or no, not very verbal this session. Patient Stated Goal: Family wants her to get stronger but feel she may need rehab.   Prior Functioning     Home Living Lives With: Family;Other (Comment) (stays with daughter and sons) Available Help at Discharge: Family;Available 24 hours/day Type of Home: House Home Access: Stairs to enter [employer] of Steps: 2 Entrance Stairs-Rails: None Home Layout: One level Bathroom Shower/Tub: Engineer, manufacturing systems: Standard Home Adaptive Equipment: Tub transfer bench;Bedside commode/3-in-1;Walker - rolling;Wheelchair - manual;Hospital bed Additional Comments: Was getting home health therapy and aide prior to coming in. Hasn't walked in over a year per her daughter Prior Function Level of Independence: Needs assistance Needs Assistance: Bathing;Dressing;Toileting Bath: Minimal Dressing: Minimal Toileting: Minimal Vocation: Retired Musician: Other (comment) (soft talker) Dominant Hand: Right         Vision/Perception Vision - History Baseline Vision: Other (comment) (daughter reports left eye closes more than left ) Patient Visual Report: No change from baseline Vision - Assessment Eye Alignment: Impaired (comment) Vision Assessment: Vision not tested Perception Perception: Within Functional Limits Praxis Praxis: Intact   Cognition  Cognition Arousal/Alertness: Lethargic Behavior During Therapy: Flat affect Overall Cognitive Status: Impaired/Different from baseline Area of Impairment: Attention;Orientation;Following  commands;Awareness Orientation Level: Disoriented to;Person;Place;Time;Situation Current Attention Level: Focused Following Commands: Follows one step commands inconsistently General Comments: Pt without intellectual awareness at this time.    Extremity/Trunk Assessment Right Upper Extremity Assessment RUE ROM/Strength/Tone: Deficits;Unable to fully assess;Due to impaired cognition RUE ROM/Strength/Tone Deficits: Pt able to grasp and release therapist's hand bilaterally but needed AAROM for shoulder flexion 0-100 degrees. Left Upper Extremity Assessment LUE ROM/Strength/Tone: Unable to fully assess;Due to impaired cognition;Deficits LUE ROM/Strength/Tone Deficits: Pt able to grasp and release therapist's hand bilaterally but needed AAROM for shoulder flexion 0-100 degrees. Trunk Assessment Trunk Assessment: Kyphotic     Mobility Bed Mobility Bed Mobility: Supine to Sit Supine to Sit: 1: +1 Total assist;HOB elevated Transfers Transfers: Sit to Stand;Stand to Sit Sit to Stand: Without upper extremity assist;1: +2 Total assist Sit to Stand: Patient Percentage: 20% Stand to Sit: 1: +2 Total assist;To chair/3-in-1;Without upper extremity assist Stand to Sit: Patient Percentage: 20% Details for Transfer Assistance: Pt did not attempt UE use with transfer.        Balance Balance Balance Assessed: Yes Static Sitting Balance Static Sitting - Balance Support: No upper extremity supported Static Sitting - Level of Assistance: 4: Min assist High Level Balance High Level Balance Comments: Pt with posterior lean in sitting needs occasional assistance and verbal cueing to correct.   End of Session OT - End of Session Activity Tolerance: Patient limited by fatigue Patient left: in chair;with call bell/phone within reach;with family/visitor present Nurse Communication: Mobility status     Davyon Fisch OTR/L Pager number [phone] 09/30/2012, 9:07 AM

## 2012-09-30 NOTE — Progress Notes (Signed)
[facility]  Patient Status: Active (receiving services up to time of hospitalization)  [facility] is providing the following services: RN, PT, OT, MSW and HHA  If patient discharges after hours, please call [phone].   [name] 09/30/2012, 12:57 PM

## 2012-09-30 NOTE — Progress Notes (Signed)
ANTICOAGULATION CONSULT NOTE - Follow-Up Consult  Pharmacy Consult for Heparin Indication: ischemic leg  No Known Allergies  Patient Measurements: Height: 5' (152.4 cm) Weight: 94 lb 5.7 oz (42.8 kg) IBW/kg (Calculated) : 45.5  Vital Signs: Temp: 97.4 F (36.3 C) (06/13 0534) Temp src: Axillary (06/13 0534) BP: 92/61 mmHg (06/13 0534) Pulse Rate: 100 (06/13 0534)  Labs:  Recent Labs  09/27/12 1522 09/28/12 0447 09/29/12 0450 09/30/12 0515  HGB 13.2 12.6  --  13.7  HCT 39.9 38.4  --  40.9  PLT 129* 130*  --  141*  HEPARINUNFRC  --   --   --  0.23*  CREATININE 0.90 1.00 0.80  --     Estimated Creatinine Clearance: 32.2 ml/min (by C-G formula based on Cr of 0.8).  Assessment: [name] who was admitted to [facility] 09/27/12 with HF exacerbation. Pt developed acute arterial embolism to R leg. Transferred to [facility] 6/13 for vascular evaluation. Family/patient have decided to do palliative care rather than proceeding to OR. Heparin level 0.23 this a.m. - SUBtherapeutic on 800 units/hr. No bleeding noted. Plt count improving. H/H stable.  Goal of Therapy:  Heparin level 0.3-0.7 units/ml Monitor platelets by anticoagulation protocol: Yes   Plan:  1) Increase heparin gtt to 900 units/hr 2) Check 8 hr heparin level  Christoper Fabian, PharmD, BCPS Clinical pharmacist, pager [phone] 09/30/2012,6:03 AM

## 2012-09-30 NOTE — Progress Notes (Signed)
[facility] PROGRESS NOTE  [name] ZHY:[id number] DOB: [birth year] DOA: 09/27/2012 PCP: Cassell Smiles., MD    Brief Narrative Pt is a 79 y.o. (1921/07/02) female with multiple medical problems including CHF, atrial flutter,mild dementia and diabetes who was initially admitted to [facility] with chief complaint shortness of breath with desaturation per EMS.  In ED there patient had new onset atrial flutter in the setting of chronic cardiac arhythmia. While at [facility] yesterday 6/12 she reported increased R knee pain. The patient has a long standing history of right knee pain by report from family. Acute exacerbation of the pain occurred 6/12 afternoon with marked change in exam ~3:pm. History cannot be obtained from the patient due to altered mental status. Per family the patient has a history of venous stasis, unclear if just right leg or left leg. The patient has not be ambulatory for years and is wheelchair bound at home. She lives with her family currently  Assessment/Plan:   1.Acute on chronic PAD with acute arterial embolism to right leg -Appreciate vascular assistance -Following discussion with Dr. Imogene Burn family decided to proceed with palliative care rather than pt having surgery -Have consulted palliative care, continue pain management, follow. 2.Acute on chronic systolic congestive heart failure: - Continue Lasix, beta blocker, lisinopril.  3.Acute respiratory failure with hypoxia: Superimposed on chronic respiratory failure (nighttime oxygen). Wean oxygen as tolerated. Treat heart failure.  4.Atrial flutter with rapid ventricular response:   Continue Coreg for rate control.  5.History of nonsustained ventricular tachycardia: No recurrence documented, follow.  6.Chronic thrombocytopenia: Secondary to ITP, no treatment required per previous documentation.  7.Diabetes mellitus type 2 diet controlled: Hemoglobin A1c 6.08/2012. on sliding scale insulin.  8.Dementia:stable   Code Status:  full Family Communication: daughter/POA at bedside Disposition Plan: pending palliative meeting   Consultants:  Vascular surgery - Dr Imogene Burn  Palliative care  Procedures:  none  Antibiotics:  none  HPI/Subjective: Pt c/o R. Knee pain, daughter at bedside  Objective: Filed Vitals:   09/29/12 2148 09/30/12 0140 09/30/12 0500 09/30/12 0534  BP: 97/45 89/64  92/61  Pulse: 98 112  100  Temp: 97.2 F (36.2 C)   97.4 F (36.3 C)  TempSrc: Oral   Axillary  Resp: 18 18  18   Height:      Weight: 43.455 kg (95 lb 12.8 oz)  42.8 kg (94 lb 5.7 oz) 42.8 kg (94 lb 5.7 oz)  SpO2: 99% 100%  96%    Intake/Output Summary (Last 24 hours) at 09/30/12 0847 Last data filed at 09/29/12 1730  Gross per 24 hour  Intake    240 ml  Output   1600 ml  Net  -1360 ml   Filed Weights   09/29/12 2148 09/30/12 0500 09/30/12 0534  Weight: 43.455 kg (95 lb 12.8 oz) 42.8 kg (94 lb 5.7 oz) 42.8 kg (94 lb 5.7 oz)    Exam:   General:  Elderly female, in no resp distress  Cardiovascular: regular, nl S1S2  Respiratory: Decreased BS at bases, no wheezes  Abdomen: soft +BS NT/ND  Extremities:  RLE cool to touch and lower leg dark/wrinkled   Data Reviewed: Basic Metabolic Panel:  Recent Labs Lab 09/23/12 1545 09/27/12 1522 09/28/12 0447 09/29/12 0450  NA 142 142 143 137  K 4.5 3.9 4.0 3.5  CL 101 104 104 97  CO2 24 26 29  33*  GLUCOSE 78 151* 152* 111*  BUN 18 17 14 15   CREATININE 0.92 0.90 1.00 0.80  CALCIUM 10.4 9.7  9.5 9.3   Liver Function Tests:  Recent Labs Lab 09/27/12 1522  AST 17  ALT 32  ALKPHOS 57  BILITOT 0.7  PROT 6.3  ALBUMIN 2.8*   No results found for this basename: LIPASE, AMYLASE,  in the last 168 hours No results found for this basename: AMMONIA,  in the last 168 hours CBC:  Recent Labs Lab 09/23/12 1545 09/27/12 1522 09/28/12 0447 09/30/12 0515  WBC 8.2 9.3 9.5 8.8  NEUTROABS 7.1 7.8*  --   --   HGB 15.2* 13.2 12.6 13.7  HCT 45.3 39.9 38.4  40.9  MCV 88.3 88.5 88.5 86.1  PLT 106* 129* 130* 141*   Cardiac Enzymes: No results found for this basename: CKTOTAL, CKMB, CKMBINDEX, TROPONINI,  in the last 168 hours BNP (last 3 results)  Recent Labs  09/16/12 1342 09/23/12 1545 09/27/12 1522  PROBNP 3786.0* 2560.0* 3230.0*   CBG:  Recent Labs Lab 09/28/12 2114 09/29/12 0729 09/29/12 1130 09/29/12 1717 09/29/12 2140  GLUCAP 132* 91 126* 148* 157*    No results found for this or any previous visit (from the past 240 hour(s)).   Studies: Ct Angio Ao+bifem W/cm &/or Wo/cm  09/29/2012   *RADIOLOGY REPORT*  Clinical Data:  Right leg pain, suspected ischemia  CT ANGIOGRAPHY OF ABDOMINAL AORTA WITH ILIOFEMORAL RUNOFF  Technique:  Multidetector CT imaging of the abdomen, pelvis and lower extremities was performed using the standard protocol during bolus administration of intravenous contrast.  Multiplanar CT image reconstructions including MIPs were obtained to evaluate the vascular anatomy.  Contrast: OMNIPAQUE IOHEXOL 350 MG/ML SOLN  Comparison:  Noninvasive lower extremity vascular evaluation - earlier same day; chest radiograph - 09/27/2012  Vascular Findings:  Evaluation of the abdominal vasculature is limited secondary to patient motion artifact.  Imaged thoracic aorta: Several tiny (2 mm) penetrated atherosclerotic ulcers are noted within distal aspect of the descending thoracic aorta (images 16, 17 and 20). No periaortic stranding.  Abdominal aorta:  There is scattered mixed slightly irregular mixed calcified and noncalcified atherosclerotic plaque within a normal caliber abdominal aorta.  No abdominal aortic dissection or periaortic stranding.  Celiac artery:  There is a mixture of mixed calcified and noncalcified plaque involving the origin of the celiac artery not definitely resulting in hemodynamically significant stenosis.  SMA:  There is mixed calcified and noncalcified plaque involving the origin and proximal aspect of  the SMA which likely approaches 40% luminal narrowing (images 42 and 43, series 5).  Right renal artery:  Duplicated codominant right-sided renal artery is are patent but diminutive throughout their course.  There is suspected narrowing of the origin of both right-sided renal arteries.  Left renal artery:  Solitary; diminutive throughout its course but patent.  IMA:  Patent.  -----------------------------------------------------------  Right Lower Extremity:  Pelvic vasculature:  There is scattered mixed calcified and noncalcified plaque within the right common iliac artery, not resulting in hemodynamically significant stenosis.  The right internal iliac artery is mildly ectatic measuring approximately 7 mm in diameter proximally (image 88, series five).  The right external iliac artery is widely patent throughout its course.  Right common femoral artery:  There is a near occlusive filling defect throughout the entirety the common femoral artery. The vessel appears mildly expanded at this level suggestive of embolism.  Right deep femoral artery:  There is mixed occlusive or nonocclusive thrombus throughout the proximal aspect of the right deep femoral artery.  Right superficial femoral artery:  There is proximally occlusive and distally  partially occlusive thrombus throughout the entirety of the right superficial femoral artery.  Right popliteal artery:  There is near occlusive thrombus throughout the entirety of the right above and below knee popliteal arteries.  Right lower leg:  The anterior tibial artery occludes shortly after its origin.  There is a two vessel runoff to the lower leg however both the posterior tibial and peroneal arteries appear to occlude above the level of the malleoli.  A patent right-sided dorsalis pedis artery is not identified.  -------------------------------------------------------------  Left Lower Extremity:  Pelvic vasculature:  There is mixed calcified and noncalcified plaque  involving the origin of the left external iliac artery resulting in approximately 70% luminal narrowing (axial image 73, series 5, coronal image 50, severe 8).  This finding is associated with mild post stenotic dilatation with the left external iliac artery measuring approximately 1.3 cm in greatest short axis diameter (image 76, series five).  The left internal iliac artery is diseased but patent and of normal caliber.  There is a minimal amount of eccentric atherosclerotic plaque with the distal aspect of the left external iliac artery, not resulting in hemodynamically significant stenosis.  Left common femoral artery:  Widely patent without hemodynamically significant narrowing.  Left deep femoral artery:  Widely patent.  Left superficial femoral artery:  There is a short segment severe (rate of 70%) narrowing of the distal aspect of the left superficial femoral artery at the level of the abductor canal (images 194 - 196). There is scattered eccentric mixed calcified and noncalcified plaque throughout the area of the proximal mid aspect of the left superficial femoral artery, not resulting in hemodynamically significant narrowing.  Left popliteal artery:  Evaluation degraded secondary to patient motion artifact.  There is scattered mixed eccentric calcified and noncalcified plaque primarily involving the above-knee popliteal artery, not definitely resulting in hemodynamically significant narrowing.  Left lower leg:  There is a minimal amount of eccentric calcified plaque involving the proximal aspect the left anterior tibial artery (image 273, series five).  There is a three-vessel runoff to the left foot.  A patent dorsalis pedis artery is identified to the level of the hind foot.  Review of the MIP images confirms the above findings.  ------------------------------------------------------------------- -----  Nonvascular findings:  Evaluation of the abdominal organs is limited to the arterial phase of  enhancement. Evaluation of the abdominal organs is degraded secondary to patient motion artifact.  Normal hepatic contour.  There is a peripheral nodular enhancing approximately 2.1 x 1.6 cm hemangioma within the left lobe of the liver (image 43, series five) as well as a similarly appearing approximately 2.3 x 2.2 cm hemangioma within the dome of the right lobe of the liver (coronal images 48 and 49, series 7). Normal appearance of the gallbladder.  No intra or extrahepatic biliary ductal dilatation.  No ascites.  There is a geographic area of apparent wedge shaped perfusion involving the posterior superior aspect of the left kidney (image 40).  Bilateral sub centimeter hypoattenuating lesions too small to accurate characterize of favored to represent renal cysts.  No definite renal stones on the post contrast examination.  No urinary obstruction or perinephric stranding.  Note is made of a sub centimeter (approximately 8 mm) nodule within the medial limb of the right adrenal gland.  Too small to accurately characterize.  Normal appearance of the left adrenal gland.  There is an approximately 1.2 x 1.5 cm cyst within the lower spleen (image 32, series five).  The pancreatic duct  is dilated regional to the head of the pancreas measuring approximately 8 mm in diameter (image 50, series 5 though this finding is without discrete pancreatic atrophy given the patient age.  The bowel is normal in course and caliber without a discrete area of wall thickening or obstruction.  Normal appearance of the appendix.  No pneumoperitoneum, pneumatosis or portal venous gas. No definite retroperitoneal, mesenteric, pelvic or inguinal lymphadenopathy.  Normal appearance of the pelvic organs for age. No free fluid within the pelvis.  Limited visualization of the lower thorax demonstrates bibasilar heterogeneous air space opacities, right greater than left.  Note is made of a small right-sided pleural effusion. There is a geographic  approximately 1.9 x 1.3 cm area of decreased attenuation within the right lower lobe adjacent to the costophrenic angle (image 11, series 5, coronal image 38, series seven) which may represent a small area of developing pulmonary abscess.  Cardiomegaly, in particular, there is marked enlargement of the right side of the heart.  Coronary calcifications.  No pericardial effusion.  IMPRESSION:  1.  There is presumed embolism within the right common femoral artery extending to involve their entirety of the right superficial femoral and popliteal arteries as well as the proximal aspect of the right deep femoral artery resulting in multifocal occlusive and partially occlusive flow to the right lower extremity.  There is an atretic two-vessel runoff to the level of the malleoli via the posterior tibial and peroneal arteries.  2.  There is an approximately 2.7 x 2.2 cm geographic region of decreased attenuation within the posterior superior aspect of the left kidney.  Differential considerations include a focal area of pyelonephritis versus an underlying renal mass, though in current clinical setting, renal infarct is suspected.  3.  No definite evidence of embolism to the left lower extremity, though there is an approximately 70% narrowing involving the proximal aspect of the left common iliac artery with associated mild post stenotic dilatation as well as short segment severe narrowing of the distal aspect of the left superficial femoral artery at the level of the abductor canal.  Three-vessel runoff to the left foot.  The left-sided dorsalis pedis artery is identified to the level of the hind foot.  4.  Scattered irregular atherosclerotic plaque within a mildly tortuous but normal caliber abdominal aorta.  No discrete filling defects within the abdominal aorta.  5.  Cardiomegaly, in particular, there is marked enlargement of the right side of the heart.  In the setting of presumed distal embolism, further evaluation with  cardiac echo is recommended.  6.  Air space opacities within the imaged bilateral lower lobes worrisome for multifocal infection with possible approximately 1.9 cm area of relative decreased attenuation within the right lower lobe worrisome for a developing pulmonary parenchymal abscess and associated small right-sided parapneumonic pleural effusion.  7.  Nonspecific pancreatic ductal dilatation, the etiology of which is not detected on this examination.  Further evaluation with non emergent MRCP may be obtained as clinically indicated.  Above findings discussed with Dr. Rubin Payor at 2018.   Original Report Authenticated By: Tacey Ruiz, MD   US Arterial Seg Single  09/29/2012   *RADIOLOGY REPORT*  Clinical Data:  Right leg pain and coldness, rest pain, history diabetes, hyperlipidemia, CHF, ITP, coronary artery disease  ULTRASOUND ANKLE/BRACHIAL INDICES BILATERAL  Comparison: 11/28/2004  Findings: Following pressures obtained, in mm Hg: Left brachial:          127 Left dorsalis pedis:  123 Left posterior tibial:  no flow detected Left ABI:               0.97  Right brachial:         118 Right dorsalis pedis:   no flow detected Right posterior tibial: no flow detected Right ABI:              N/A  Unable to obtain Doppler wave forms at the right superficial femoral or popliteal arteries as well.  IMPRESSION: Normal left ABI 0.97. No arterial flow could be detected within the right superficial femoral, popliteal, dorsalis pedis, or posterior tibial arteries. Findings either represent right lower extremity proximal arterial occlusion or high-grade stenosis with trickle flow which could not be detected.  Critical Value/emergent results were called by telephone at the time of interpretation on 09/29/2012 at 1710 hours to Dr. Irene Limbo, who verbally acknowledged these results.   Original Report Authenticated By: Ulyses Southward, M.D.    Scheduled Meds: . aspirin  81 mg Oral Daily  . carvedilol  6.25 mg Oral BID  WC  . darifenacin  7.5 mg Oral Daily  . feeding supplement  237 mL Oral TID BM  . furosemide  40 mg Oral BID  . insulin aspart  0-9 Units Subcutaneous TID WC  . isosorbide mononitrate  60 mg Oral q morning - 10a  . lisinopril  10 mg Oral q morning - 10a  . multivitamin with minerals  1 tablet Oral [id number]  . pantoprazole  40 mg Oral q morning - 10a  . potassium chloride SA  20 mEq Oral q morning - 10a  . simvastatin  20 mg Oral QHS  . sodium chloride  3 mL Intravenous Q12H   Continuous Infusions: . sodium chloride 10 mL/hr at 09/29/12 2148  . heparin 900 Units/hr (09/30/12 1610)    Principal Problem:   Acute on chronic systolic heart failure Active Problems:   Coronary artery disease   GERD (gastroesophageal reflux disease)   Dementia   Diabetes   Atrial flutter   Acute respiratory failure with hypoxia    Time spent: 35    Jsiah Menta C  [facility] Pager [phone]. If 7PM-7AM, please contact night-coverage at [email or web address], password [email or web address] 09/30/2012, 8:47 AM  LOS: 3 days

## 2012-10-01 DIAGNOSIS — Z515 Encounter for palliative care: Secondary | ICD-10-CM

## 2012-10-01 DIAGNOSIS — M79609 Pain in unspecified limb: Secondary | ICD-10-CM

## 2012-10-01 LAB — GLUCOSE, CAPILLARY
Glucose-Capillary: 128 mg/dL — ABNORMAL HIGH (ref 70–99)
Glucose-Capillary: 242 mg/dL — ABNORMAL HIGH (ref 70–99)
Glucose-Capillary: 202 mg/dL — ABNORMAL HIGH (ref 70–99)
Glucose-Capillary: 99 mg/dL (ref 70–99)

## 2012-10-01 LAB — CBC
HCT: 41.1 % (ref 36.0–46.0)
Hemoglobin: 13.8 g/dL (ref 12.0–15.0)
MCH: 29.2 pg (ref 26.0–34.0)
MCHC: 33.6 g/dL (ref 30.0–36.0)
MCV: 87.1 fL (ref 78.0–100.0)
RBC: 4.72 MIL/uL (ref 3.87–5.11)

## 2012-10-01 LAB — HEPARIN LEVEL (UNFRACTIONATED): Heparin Unfractionated: 0.6 IU/mL (ref 0.30–0.70)

## 2012-10-01 MED ORDER — LIDOCAINE 5 % EX PTCH
1.0000 | MEDICATED_PATCH | CUTANEOUS | Status: DC
  Administered 2012-10-01 – 2012-10-06 (×6): 1 via TRANSDERMAL
  Filled 2012-10-01 (×6): qty 1

## 2012-10-01 MED ORDER — HYDROCODONE-ACETAMINOPHEN 5-325 MG PO TABS
1.0000 | ORAL_TABLET | ORAL | Status: DC
  Administered 2012-10-01 – 2012-10-04 (×5): 1 via ORAL
  Filled 2012-10-01 (×7): qty 1

## 2012-10-01 NOTE — Progress Notes (Signed)
Pt.is A/Ox1. She had no signs of pain. Pt.is on continuous heparin drip. Foley catheter discontinued by previous RN at start of shift. Pt.has had poor fluid and food intake. Monitored patient's output. Bladder scan done and 181 ml was remaining at 0613. Pt.is incontinent and voided small amount of urine soon after bladder scan. Will leave sticky note for MD to address poor intake and considering mattress overlay for pt.

## 2012-10-01 NOTE — Consult Note (Signed)
Patient EX:BMWU A [name]      DOB: 07-30-1921      XLK:[id number]  Met with Patient's son's [name], and [name], and two daughters [name] and [name].  Grandaughter [name] was also present.  Patient unable to participate due to advanced dementia. Family able to articulate disease process and outcomes of having ischemic limb.  They recognize that gangrene can threaten her life and recognize the risks that go with amputation.  We talked about the patient not eating well and role of feeding tubes.  We talked about her code status at this time in her life.  Family overall has chosen a non surgical approach they are leaning toward long term anticoagulation as long as it makes sense and is possible .  They are going to talk among themselves about her code status but are leaning toward DNR with continued general medical treatments but wanted to talk further.  They are in agreement with pain control with scheduled oral medications and prn morphine IV for breakthrough.  They understand that she is at high risk for death related to her illness and so we talked about her location of care.  Her daughter [name] has been her excellent care giver for many years . They do not feel that they can take her home as [name] has had failing health . We talked about SNF vs Hospice home disposition.  They will talk among themselves and get back with me this weekend.   Total time 900- 1010 am.  Full consult to follow.  [name], MD MBA The Palliative Medicine Team at [facility] Phone: [phone] Pager: [phone]

## 2012-10-01 NOTE — Evaluation (Signed)
Physical Therapy Evaluation Patient Details Name: [name] MRN: [id number] DOB: 02/23/1922 Today's Date: 10/01/2012 Time: 4098-1191 PT Time Calculation (min): 17 min  PT Assessment / Plan / Recommendation Clinical Impression  Pt is 79 y/o female admitted for ischemic foot and increase pain.  Pt with FTT and limited due to pain.  Family discussing goals of care with Palliative Care Team with possible SNF vs. Hospice Care.  Family did report unable to continue to care for pt at home.  Will recommend SNF vs Hospice and defer PT services if needed to next venue per Family wishes.  PT will sign off.     PT Assessment  All further PT needs can be met in the next venue of care    Follow Up Recommendations  SNF (vs Hospice (family further discussing))    Precautions / Restrictions Precautions Precautions: Fall   Pertinent Vitals/Pain Moaning with rolling in bed      Mobility  Bed Mobility Bed Mobility: Rolling Right;Rolling Left;Scooting to [facility] Rolling Right: 1: +2 Total assist Rolling Right: Patient Percentage: 0% Rolling Left: 1: +2 Total assist Rolling Left: Patient Percentage: 0% Supine to Sit: 1: +2 Total assist Supine to Sit: Patient Percentage: 0% Details for Bed Mobility Assistance: +2 (A) with all mobility.  Pt reposition in bed and HOB elevated for pt to attempt to eat.  Pt moaned with rolling therefore did not sit pt EOB and defer to SNF./ Transfers Transfers: Not assessed Ambulation/Gait Ambulation/Gait Assistance: Not tested (comment)    Exercises     PT Diagnosis:    PT Problem List:   PT Treatment Interventions:     PT Goals    Visit Information  Last PT Received On: 10/01/12 Assistance Needed: +2    Subjective Data  Subjective: "I guess so." (Pt stated after son asked if she wanted to eat.) Patient Stated Goal: Family to go to SNF.  Discussing SNF vs Hospice   Prior Functioning  Home Living Lives With: Family;Other (Comment) (stays with  daughter and sons) Available Help at Discharge: Family;Available 24 hours/day Type of Home: House Home Access: Stairs to enter [employer] of Steps: 2 Entrance Stairs-Rails: None Home Layout: One level Bathroom Shower/Tub: Engineer, manufacturing systems: Standard Home Adaptive Equipment: Tub transfer bench;Bedside commode/3-in-1;Walker - rolling;Wheelchair - manual;Hospital bed Additional Comments: Was getting home health therapy and aide prior to coming in. Hasn't walked in over a year per her daughter Prior Function Level of Independence: Needs assistance Needs Assistance: Bathing;Dressing;Toileting Bath: Minimal Dressing: Minimal Toileting: Minimal Vocation: Retired Musician: Other (comment) (soft talker) Dominant Hand: Right    Cognition  Cognition Arousal/Alertness: Lethargic Behavior During Therapy: Flat affect Overall Cognitive Status: Impaired/Different from baseline General Comments: Difficult to assess due to lethargy    Extremity/Trunk Assessment Right Lower Extremity Assessment RLE ROM/Strength/Tone: Unable to fully assess;Due to impaired cognition Left Lower Extremity Assessment LLE ROM/Strength/Tone: Unable to fully assess;Due to impaired cognition   Balance    End of Session PT - End of Session Activity Tolerance: Patient limited by fatigue Patient left: in bed;with call bell/phone within reach;with family/visitor present Nurse Communication: Need for lift equipment  GP     Cabella Kimm 10/01/2012, 2:56 PM

## 2012-10-01 NOTE — Progress Notes (Signed)
ANTICOAGULATION CONSULT NOTE - Follow-Up Consult  Pharmacy Consult for Heparin Indication: ischemic leg  No Known Allergies  Patient Measurements: Height: 5' (152.4 cm) Weight: 92 lb 2.4 oz (41.8 kg) IBW/kg (Calculated) : 45.5  Vital Signs: Temp: 98.3 [name] (36.8 C) (06/14 0457) Temp src: Axillary (06/14 0457) BP: 103/72 mmHg (06/14 0803) Pulse Rate: 120 (06/14 0803)  Labs:  Recent Labs  09/29/12 0450 09/30/12 0515 09/30/12 1421 10/01/12 0750  HGB  --  13.7  --  13.8  HCT  --  40.9  --  41.1  PLT  --  141*  --  122*  HEPARINUNFRC  --  0.23* 0.48 0.91*  CREATININE 0.80  --   --   --     Estimated Creatinine Clearance: 31.5 ml/min (by C-G formula based on Cr of 0.8).  Assessment: [name] who was admitted to [facility] 09/27/12 with HF exacerbation. Pt developed acute arterial embolism to R leg. Transferred to [facility] 6/13 for vascular evaluation. Family/patient have decided to do palliative care rather than proceeding to OR. Heparin level 0.91 this AM, CBC stable, plts 122<141, no overt bleeding noted.   Goal of Therapy:  Heparin level 0.3-0.7 units/ml Monitor platelets by anticoagulation protocol: Yes   Plan:  -Decrease heparin drip to 800 units/hr -8 hour HL at 1800 -Daily CBC/HL -Monitor for bleeding -[name]/U after possible goals of care meeting  [name], PharmD Clinical Pharmacist Phone: [phone] Pager: [phone] 10/01/2012 9:35 AM

## 2012-10-01 NOTE — Progress Notes (Addendum)
[facility] PROGRESS NOTE  [name]:[id number] DOB: 1921/07/01 DOA: 09/27/2012 PCP: Cassell Smiles., MD    Brief Narrative Pt is a 79 y.o. ([birth year]) female with multiple medical problems including CHF, atrial flutter,mild dementia and diabetes who was initially admitted to [facility] with chief complaint shortness of breath with desaturation per EMS.  In ED there patient had new onset atrial flutter in the setting of chronic cardiac arhythmia. While at [facility] yesterday 6/12 she reported increased R knee pain. The patient has a long standing history of right knee pain by report from family. Acute exacerbation of the pain occurred 6/12 afternoon with marked change in exam ~3:pm. History cannot be obtained from the patient due to altered mental status. Per family the patient has a history of venous stasis, unclear if just right leg or left leg. The patient has not be ambulatory for years and is wheelchair bound at home. She lives with her family currently  Assessment/Plan:   1.Acute on chronic PAD with acute arterial embolism to right leg -Appreciate vascular assistance -Following discussion with Dr. Imogene Burn family decided 6/13 to proceed with palliative care rather than pt having surgery -appreciate palliative care assistance, on scheduled po meds for pain management-family confirmed nonsurgical treatment and still to decide on alternate feeding, and code status as well as snf vs hospice. -continue heparin for anticoagulation 2.Acute on chronic systolic congestive heart failure: - Continue Lasix, beta blocker, lisinopril.  3.Acute respiratory failure with hypoxia: Superimposed on chronic respiratory failure (nighttime oxygen). Wean oxygen as tolerated. Treat heart failure.  4.Atrial flutter with rapid ventricular response:   Continue Coreg for rate control.  5.History of nonsustained ventricular tachycardia: No recurrence documented, follow.  6.Chronic thrombocytopenia: Secondary to ITP, no  treatment required per previous documentation.  7.Diabetes mellitus type 2 diet controlled: Hemoglobin A1c 6.08/2012. on sliding scale insulin.  8.Dementia:stable   Code Status: full Family Communication: daughter/POA at bedside Disposition Plan: pending palliative meeting   Consultants:  Vascular surgery - Dr Imogene Burn  Palliative care  Procedures:  none  Antibiotics:  none  HPI/Subjective: Pt sleeping, appears comfortable. per daughter at bedside as been mostly sleepy today  Objective: Filed Vitals:   10/01/12 0457 10/01/12 0803 10/01/12 1039 10/01/12 1328  BP: 90/61 103/72 90/40 100/42  Pulse: 77 120 83   Temp: 98.3 F (36.8 C)     TempSrc: Axillary     Resp: 18  18   Height:      Weight: 41.8 kg (92 lb 2.4 oz)     SpO2: 97%  99%     Intake/Output Summary (Last 24 hours) at 10/01/12 1610 Last data filed at 10/01/12 0900  Gross per 24 hour  Intake     63 ml  Output    200 ml  Net   -137 ml   Filed Weights   09/30/12 0500 09/30/12 0534 10/01/12 0457  Weight: 42.8 kg (94 lb 5.7 oz) 42.8 kg (94 lb 5.7 oz) 41.8 kg (92 lb 2.4 oz)    Exam:   General:  Elderly female,somnolent in no resp distress  Cardiovascular: regular, nl S1S2  Respiratory: Decreased BS at bases, no wheezes  Abdomen: soft +BS NT/ND  Extremities:  RLE cool to touch and lower leg dark/wrinkled   Data Reviewed: Basic Metabolic Panel:  Recent Labs Lab 09/27/12 1522 09/28/12 0447 09/29/12 0450  NA 142 143 137  K 3.9 4.0 3.5  CL 104 104 97  CO2 26 29 33*  GLUCOSE 151* 152* 111*  BUN 17 14 15   CREATININE 0.90 1.00 0.80  CALCIUM 9.7 9.5 9.3   Liver Function Tests:  Recent Labs Lab 09/27/12 1522  AST 17  ALT 32  ALKPHOS 57  BILITOT 0.7  PROT 6.3  ALBUMIN 2.8*   No results found for this basename: LIPASE, AMYLASE,  in the last 168 hours No results found for this basename: AMMONIA,  in the last 168 hours CBC:  Recent Labs Lab 09/27/12 1522 09/28/12 0447  09/30/12 0515 10/01/12 0750  WBC 9.3 9.5 8.8 11.1*  NEUTROABS 7.8*  --   --   --   HGB 13.2 12.6 13.7 13.8  HCT 39.9 38.4 40.9 41.1  MCV 88.5 88.5 86.1 87.1  PLT 129* 130* 141* 122*   Cardiac Enzymes: No results found for this basename: CKTOTAL, CKMB, CKMBINDEX, TROPONINI,  in the last 168 hours BNP (last 3 results)  Recent Labs  09/16/12 1342 09/23/12 1545 09/27/12 1522  PROBNP 3786.0* 2560.0* 3230.0*   CBG:  Recent Labs Lab 09/30/12 1131 09/30/12 1659 09/30/12 2106 10/01/12 0642 10/01/12 1059  GLUCAP 241* 130* 193* 128* 242*    No results found for this or any previous visit (from the past 240 hour(s)).   Studies: Ct Angio Ao+bifem W/cm &/or Wo/cm  09/29/2012   *RADIOLOGY REPORT*  Clinical Data:  Right leg pain, suspected ischemia  CT ANGIOGRAPHY OF ABDOMINAL AORTA WITH ILIOFEMORAL RUNOFF  Technique:  Multidetector CT imaging of the abdomen, pelvis and lower extremities was performed using the standard protocol during bolus administration of intravenous contrast.  Multiplanar CT image reconstructions including MIPs were obtained to evaluate the vascular anatomy.  Contrast: OMNIPAQUE IOHEXOL 350 MG/ML SOLN  Comparison:  Noninvasive lower extremity vascular evaluation - earlier same day; chest radiograph - 09/27/2012  Vascular Findings:  Evaluation of the abdominal vasculature is limited secondary to patient motion artifact.  Imaged thoracic aorta: Several tiny (2 mm) penetrated atherosclerotic ulcers are noted within distal aspect of the descending thoracic aorta (images 16, 17 and 20). No periaortic stranding.  Abdominal aorta:  There is scattered mixed slightly irregular mixed calcified and noncalcified atherosclerotic plaque within a normal caliber abdominal aorta.  No abdominal aortic dissection or periaortic stranding.  Celiac artery:  There is a mixture of mixed calcified and noncalcified plaque involving the origin of the celiac artery not definitely resulting in  hemodynamically significant stenosis.  SMA:  There is mixed calcified and noncalcified plaque involving the origin and proximal aspect of the SMA which likely approaches 40% luminal narrowing (images 42 and 43, series 5).  Right renal artery:  Duplicated codominant right-sided renal artery is are patent but diminutive throughout their course.  There is suspected narrowing of the origin of both right-sided renal arteries.  Left renal artery:  Solitary; diminutive throughout its course but patent.  IMA:  Patent.  -----------------------------------------------------------  Right Lower Extremity:  Pelvic vasculature:  There is scattered mixed calcified and noncalcified plaque within the right common iliac artery, not resulting in hemodynamically significant stenosis.  The right internal iliac artery is mildly ectatic measuring approximately 7 mm in diameter proximally (image 88, series five).  The right external iliac artery is widely patent throughout its course.  Right common femoral artery:  There is a near occlusive filling defect throughout the entirety the common femoral artery. The vessel appears mildly expanded at this level suggestive of embolism.  Right deep femoral artery:  There is mixed occlusive or nonocclusive thrombus throughout the proximal aspect of the right deep  femoral artery.  Right superficial femoral artery:  There is proximally occlusive and distally partially occlusive thrombus throughout the entirety of the right superficial femoral artery.  Right popliteal artery:  There is near occlusive thrombus throughout the entirety of the right above and below knee popliteal arteries.  Right lower leg:  The anterior tibial artery occludes shortly after its origin.  There is a two vessel runoff to the lower leg however both the posterior tibial and peroneal arteries appear to occlude above the level of the malleoli.  A patent right-sided dorsalis pedis artery is not identified.   -------------------------------------------------------------  Left Lower Extremity:  Pelvic vasculature:  There is mixed calcified and noncalcified plaque involving the origin of the left external iliac artery resulting in approximately 70% luminal narrowing (axial image 73, series 5, coronal image 50, severe 8).  This finding is associated with mild post stenotic dilatation with the left external iliac artery measuring approximately 1.3 cm in greatest short axis diameter (image 76, series five).  The left internal iliac artery is diseased but patent and of normal caliber.  There is a minimal amount of eccentric atherosclerotic plaque with the distal aspect of the left external iliac artery, not resulting in hemodynamically significant stenosis.  Left common femoral artery:  Widely patent without hemodynamically significant narrowing.  Left deep femoral artery:  Widely patent.  Left superficial femoral artery:  There is a short segment severe (rate of 70%) narrowing of the distal aspect of the left superficial femoral artery at the level of the abductor canal (images 194 - 196). There is scattered eccentric mixed calcified and noncalcified plaque throughout the area of the proximal mid aspect of the left superficial femoral artery, not resulting in hemodynamically significant narrowing.  Left popliteal artery:  Evaluation degraded secondary to patient motion artifact.  There is scattered mixed eccentric calcified and noncalcified plaque primarily involving the above-knee popliteal artery, not definitely resulting in hemodynamically significant narrowing.  Left lower leg:  There is a minimal amount of eccentric calcified plaque involving the proximal aspect the left anterior tibial artery (image 273, series five).  There is a three-vessel runoff to the left foot.  A patent dorsalis pedis artery is identified to the level of the hind foot.  Review of the MIP images confirms the above findings.   ------------------------------------------------------------------- -----  Nonvascular findings:  Evaluation of the abdominal organs is limited to the arterial phase of enhancement. Evaluation of the abdominal organs is degraded secondary to patient motion artifact.  Normal hepatic contour.  There is a peripheral nodular enhancing approximately 2.1 x 1.6 cm hemangioma within the left lobe of the liver (image 43, series five) as well as a similarly appearing approximately 2.3 x 2.2 cm hemangioma within the dome of the right lobe of the liver (coronal images 48 and 49, series 7). Normal appearance of the gallbladder.  No intra or extrahepatic biliary ductal dilatation.  No ascites.  There is a geographic area of apparent wedge shaped perfusion involving the posterior superior aspect of the left kidney (image 40).  Bilateral sub centimeter hypoattenuating lesions too small to accurate characterize of favored to represent renal cysts.  No definite renal stones on the post contrast examination.  No urinary obstruction or perinephric stranding.  Note is made of a sub centimeter (approximately 8 mm) nodule within the medial limb of the right adrenal gland.  Too small to accurately characterize.  Normal appearance of the left adrenal gland.  There is an approximately 1.2 x 1.5  cm cyst within the lower spleen (image 32, series five).  The pancreatic duct is dilated regional to the head of the pancreas measuring approximately 8 mm in diameter (image 50, series 5 though this finding is without discrete pancreatic atrophy given the patient age.  The bowel is normal in course and caliber without a discrete area of wall thickening or obstruction.  Normal appearance of the appendix.  No pneumoperitoneum, pneumatosis or portal venous gas. No definite retroperitoneal, mesenteric, pelvic or inguinal lymphadenopathy.  Normal appearance of the pelvic organs for age. No free fluid within the pelvis.  Limited visualization of the lower  thorax demonstrates bibasilar heterogeneous air space opacities, right greater than left.  Note is made of a small right-sided pleural effusion. There is a geographic approximately 1.9 x 1.3 cm area of decreased attenuation within the right lower lobe adjacent to the costophrenic angle (image 11, series 5, coronal image 38, series seven) which may represent a small area of developing pulmonary abscess.  Cardiomegaly, in particular, there is marked enlargement of the right side of the heart.  Coronary calcifications.  No pericardial effusion.  IMPRESSION:  1.  There is presumed embolism within the right common femoral artery extending to involve their entirety of the right superficial femoral and popliteal arteries as well as the proximal aspect of the right deep femoral artery resulting in multifocal occlusive and partially occlusive flow to the right lower extremity.  There is an atretic two-vessel runoff to the level of the malleoli via the posterior tibial and peroneal arteries.  2.  There is an approximately 2.7 x 2.2 cm geographic region of decreased attenuation within the posterior superior aspect of the left kidney.  Differential considerations include a focal area of pyelonephritis versus an underlying renal mass, though in current clinical setting, renal infarct is suspected.  3.  No definite evidence of embolism to the left lower extremity, though there is an approximately 70% narrowing involving the proximal aspect of the left common iliac artery with associated mild post stenotic dilatation as well as short segment severe narrowing of the distal aspect of the left superficial femoral artery at the level of the abductor canal.  Three-vessel runoff to the left foot.  The left-sided dorsalis pedis artery is identified to the level of the hind foot.  4.  Scattered irregular atherosclerotic plaque within a mildly tortuous but normal caliber abdominal aorta.  No discrete filling defects within the abdominal  aorta.  5.  Cardiomegaly, in particular, there is marked enlargement of the right side of the heart.  In the setting of presumed distal embolism, further evaluation with cardiac echo is recommended.  6.  Air space opacities within the imaged bilateral lower lobes worrisome for multifocal infection with possible approximately 1.9 cm area of relative decreased attenuation within the right lower lobe worrisome for a developing pulmonary parenchymal abscess and associated small right-sided parapneumonic pleural effusion.  7.  Nonspecific pancreatic ductal dilatation, the etiology of which is not detected on this examination.  Further evaluation with non emergent MRCP may be obtained as clinically indicated.  Above findings discussed with Dr. Rubin Payor at 2018.   Original Report Authenticated By: Tacey Ruiz, MD   US Arterial Seg Single  09/29/2012   *RADIOLOGY REPORT*  Clinical Data:  Right leg pain and coldness, rest pain, history diabetes, hyperlipidemia, CHF, ITP, coronary artery disease  ULTRASOUND ANKLE/BRACHIAL INDICES BILATERAL  Comparison: 11/28/2004  Findings: Following pressures obtained, in mm Hg: Left brachial:  127 Left dorsalis pedis:          123 Left posterior tibial:  no flow detected Left ABI:               0.97  Right brachial:         118 Right dorsalis pedis:   no flow detected Right posterior tibial: no flow detected Right ABI:              N/A  Unable to obtain Doppler wave forms at the right superficial femoral or popliteal arteries as well.  IMPRESSION: Normal left ABI 0.97. No arterial flow could be detected within the right superficial femoral, popliteal, dorsalis pedis, or posterior tibial arteries. Findings either represent right lower extremity proximal arterial occlusion or high-grade stenosis with trickle flow which could not be detected.  Critical Value/emergent results were called by telephone at the time of interpretation on 09/29/2012 at 1710 hours to Dr. Irene Limbo, who  verbally acknowledged these results.   Original Report Authenticated By: Ulyses Southward, M.D.    Scheduled Meds: . aspirin  81 mg Oral Daily  . carvedilol  6.25 mg Oral BID WC  . darifenacin  7.5 mg Oral Daily  . feeding supplement  237 mL Oral TID BM  . furosemide  40 mg Oral BID  . HYDROcodone-acetaminophen  1 tablet Oral Q4H  . insulin aspart  0-9 Units Subcutaneous TID WC  . isosorbide mononitrate  60 mg Oral q morning - 10a  . lidocaine  1 patch Transdermal Q24H  . lisinopril  10 mg Oral q morning - 10a  . multivitamin with minerals  1 tablet Oral BH-q7a  . pantoprazole  40 mg Oral q morning - 10a  . potassium chloride SA  20 mEq Oral q morning - 10a  . simvastatin  20 mg Oral QHS  . sodium chloride  3 mL Intravenous Q12H   Continuous Infusions: . sodium chloride 10 mL/hr at 09/30/12 2234  . heparin 800 Units/hr (10/01/12 1033)    Principal Problem:   Acute on chronic systolic heart failure Active Problems:   Coronary artery disease   GERD (gastroesophageal reflux disease)   Dementia   Diabetes   Atrial flutter   Acute respiratory failure with hypoxia    Time spent: 25    [facility]  [facility] Pager [phone]. If 7PM-7AM, please contact night-coverage at [email or web address], password [facility] 10/01/2012, 4:10 PM  LOS: 4 days

## 2012-10-01 NOTE — Progress Notes (Signed)
Physical Therapy Discharge Patient Details Name: [name] MRN: [id number] DOB: [birth year] Today's Date: 10/01/2012 Time: 0865-7846 PT Time Calculation (min): 17 min  Patient discharged from PT services secondary to Family wishes to defer PT to next venue if PT needed at that time.  Continue to discuss goals of care with Palliative Team and family to discuss Hospice vs. SNF.Marland Kitchen     GP     Sharlette Jansma 10/01/2012, 2:57 PM   Jake Shark, PT DPT [phone]

## 2012-10-01 NOTE — Progress Notes (Signed)
Pt more alert this am, however she is call on in pain. Will give Norco and continue to monitor

## 2012-10-01 NOTE — Progress Notes (Signed)
ANTICOAGULATION CONSULT NOTE - Follow Up Consult  Pharmacy Consult for Heparin Indication: ischemic leg  No Known Allergies  Patient Measurements: Height: 5' (152.4 cm) Weight: 92 lb 2.4 oz (41.8 kg) IBW/kg (Calculated) : 45.5 Heparin Dosing Weight: 41.8 kg  Vital Signs: BP: 100/42 mmHg (06/14 1328) Pulse Rate: 83 (06/14 1039)  Labs:  Recent Labs  09/29/12 0450  09/30/12 0515 09/30/12 1421 10/01/12 0750 10/01/12 1812  HGB  --   --  13.7  --  13.8  --   HCT  --   --  40.9  --  41.1  --   PLT  --   --  141*  --  122*  --   HEPARINUNFRC  --   < > 0.23* 0.48 0.91* 0.60  CREATININE 0.80  --   --   --   --   --   < > = values in this interval not displayed.  Estimated Creatinine Clearance: 31.5 ml/min (by C-G formula based on Cr of 0.8).   Medications:  Scheduled:  . aspirin  81 mg Oral Daily  . carvedilol  6.25 mg Oral BID WC  . darifenacin  7.5 mg Oral Daily  . feeding supplement  237 mL Oral TID BM  . furosemide  40 mg Oral BID  . HYDROcodone-acetaminophen  1 tablet Oral Q4H  . insulin aspart  0-9 Units Subcutaneous TID WC  . isosorbide mononitrate  60 mg Oral q morning - 10a  . lidocaine  1 patch Transdermal Q24H  . lisinopril  10 mg Oral q morning - 10a  . multivitamin with minerals  1 tablet Oral BH-q7a  . pantoprazole  40 mg Oral q morning - 10a  . potassium chloride SA  20 mEq Oral q morning - 10a  . simvastatin  20 mg Oral QHS  . sodium chloride  3 mL Intravenous Q12H   Infusions:  . sodium chloride 10 mL/hr at 09/30/12 2234  . heparin 800 Units/hr (10/01/12 1033)    Assessment: [name] continues on anticoagulation for ischemic leg.  Heparin level is therapeutic on 800 units/hr.  Goal of Therapy:  Heparin level 0.3-0.7 units/ml Monitor platelets by anticoagulation protocol: Yes   Plan:  Continue heparin at 800 units/hr. Follow-up with heparin level and CBC in AM. Will follow-up family/MD plans for long term anticoagulation vs. palliative  care.  [employer], Pharm.D., BCPS Clinical Pharmacist Pager [phone] 10/01/2012 7:37 PM

## 2012-10-01 NOTE — Consult Note (Signed)
Patient [name]      DOB: [birth year]      JXB:[id number]     Consult Note from the Palliative Medicine Team at [facility]    Consult Requested by: Dr. Donna Bernard      PCP: Cassell Smiles., MD Reason for Consultation: Goals of care    Phone Number:[phone]  Assessment of patients Current state: 79 yr old african [place] female with a past medical history for advanced dementia cared for at home by daughter Loel Ro with the support of her sisters and brothers. Patient was readmitted after recent treatment for VTach, for shortness of breath and cough.  Patient was noted to have cool right limb consistent with ischemia . She was transferred to [facility] for vascular evaluation.  Family is struggling with end of life decisions. They do not want surgery but are having a hard time internalizing that she is going to die eventually related to this ischemic limb.   Goals of Care: 1.  Code Status: FULL .Lengthy conversation about the CPR and code status . POA understands but other family members having a hard time with making her DNR despite head knowledge of the harm which it could cause.  Family going to talk among themselves   2. Scope of Treatment: 1. Pain: first and foremost family wants aggressive pain management.  Will schedule hydrocodone, add lidocaine patch for what it is worth.  Backup breakthrough pain med is low dose morphine.   2. Anorexia: change diet to puree . She has known dysphagia in the past 3. Cardiac: family expecting continued maintanence treatment.  Family would be open to oral warfarin if it makes sense and patient can take po.   4. Disposition: Family knows that they can't care for her at home . We discussed SNF vs Hospice Home.  Family will disucss   3. Symptom Management:    1. Pain: schedule hydrocodone q 4 hrs, add lidocaine patch.  Breakthrough,  With prn morphine.  2. Bowel Regimen: Monitor.  PTA  3. Fever: Prn Tylenol 4. Nausea/Vomiting: prn  zofran if needed.  None reported  4. Psychosocial:  Patient has been lovingly cared for by her daughter Loel Ro at home for many years.  She has two daughters Randal Buba and three sons- Standley Dakins  5. Spiritual: a women of faith who would benefit from chaplain  Visits        Patient Documents Completed or Given: Document Given Completed  Advanced Directives Pkt    MOST    DNR    Gone from My Sight    Hard Choices      Brief HPI: 79 yr old with ischemic leg.  Family struggling with end of life state and choice.  We were asked to help with goals of care.   ROS: unable to obtain due to patient's dementia    PMH:  Past Medical History  Diagnosis Date  . Coronary artery disease   . Ventricular tachycardia   . CHF (congestive heart failure)   . GERD (gastroesophageal reflux disease)   . Atrial flutter   . Sick sinus syndrome   . Dementia   . Diabetes mellitus   . Thrombocytopenia   . Right bundle branch block   . Hyperlipidemia   . TIA (transient ischemic attack)   . Arthritis   . ITP (idiopathic thrombocytopenic purpura) 07/14/2011    Chronic low grade idiopathic thrombocytopenia purpura still not in need of therapy.   Marland Kitchen URI (upper respiratory  infection)     history  . Hx: UTI (urinary tract infection)   . Chronic kidney disease   . Pneumonia      PSH: Past Surgical History  Procedure Laterality Date  . Coronary angioplasty with stent placement    . Biopsy thyroid     I have reviewed the FH and SH and  If appropriate update it with new information. No Known Allergies Scheduled Meds: . aspirin  81 mg Oral Daily  . carvedilol  6.25 mg Oral BID WC  . darifenacin  7.5 mg Oral Daily  . feeding supplement  237 mL Oral TID BM  . furosemide  40 mg Oral BID  . insulin aspart  0-9 Units Subcutaneous TID WC  . isosorbide mononitrate  60 mg Oral q morning - 10a  . lisinopril  10 mg Oral q morning - 10a  . multivitamin with minerals  1 tablet Oral  BH-q7a  . pantoprazole  40 mg Oral q morning - 10a  . potassium chloride SA  20 mEq Oral q morning - 10a  . simvastatin  20 mg Oral QHS  . sodium chloride  3 mL Intravenous Q12H   Continuous Infusions: . sodium chloride 10 mL/hr at 09/30/12 2234  . heparin 900 Units/hr (09/30/12 2233)   PRN Meds:.sodium chloride, acetaminophen, albuterol-ipratropium, HYDROcodone-acetaminophen, morphine injection, nitroGLYCERIN, ondansetron (ZOFRAN) IV, sodium chloride    BP 103/72  Pulse 120  Temp(Src) 98.3 F (36.8 C) (Axillary)  Resp 18  Ht 5' (1.524 m)  Wt 41.8 kg (92 lb 2.4 oz)  BMI 18 kg/m2  SpO2 97%   PPS: 20 %   Intake/Output Summary (Last 24 hours) at 10/01/12 0907 Last data filed at 09/30/12 2238  Gross per 24 hour  Intake 851.52 ml  Output    400 ml  Net 451.52 ml     Physical Exam:  General:  Pleasantly confused,  Slightly agitated from pain HEENT:  PERRL, EOMI, anicteric, mmm Chest:   Decreased, no rhonchi, rales or wheezes CVS: regular, rate and rhythm, S1,S2 Abdomen:scaphoid , soft, not tender Ext: thin, wasting Neuro:awake but pleasantly confused  Labs: CBC    Component Value Date/Time   WBC 11.1* 10/01/2012 0750   RBC 4.72 10/01/2012 0750   HGB 13.8 10/01/2012 0750   HCT 41.1 10/01/2012 0750   PLT 122* 10/01/2012 0750   MCV 87.1 10/01/2012 0750   MCH 29.2 10/01/2012 0750   MCHC 33.6 10/01/2012 0750   RDW 14.8 10/01/2012 0750   LYMPHSABS 0.6* 09/27/2012 1522   MONOABS 0.8 09/27/2012 1522   EOSABS 0.0 09/27/2012 1522   BASOSABS 0.0 09/27/2012 1522      CMP     Component Value Date/Time   NA 137 09/29/2012 0450   K 3.5 09/29/2012 0450   CL 97 09/29/2012 0450   CO2 33* 09/29/2012 0450   GLUCOSE 111* 09/29/2012 0450   BUN 15 09/29/2012 0450   CREATININE 0.80 09/29/2012 0450   CALCIUM 9.3 09/29/2012 0450   PROT 6.3 09/27/2012 1522   ALBUMIN 2.8* 09/27/2012 1522   AST 17 09/27/2012 1522   ALT 32 09/27/2012 1522   ALKPHOS 57 09/27/2012 1522   BILITOT 0.7 09/27/2012 1522    GFRNONAA 63* 09/29/2012 0450   GFRAA 74* 09/29/2012 0450    Chest Xray Reviewed/Impressions:   CT angio of abd: Right lower leg: The anterior tibial artery occludes shortly after  its origin. There is a two vessel runoff to the lower leg however  both the  posterior tibial and peroneal arteries appear to occlude  above the level of the malleoli. A patent right-sided dorsalis  pedis artery is not identified.  Severe narrowing of left superficial femoral artery, otherwise various diffuse variations of plaque without occlusion.   Time In Time Out Total Time Spent with Patient Total Overall Time  900 am 1010 am 70 min 70 min    Greater than 50%  of this time was spent counseling and coordinating care related to the above assessment and plan.    Ilaisaane Marts L. Ladona Ridgel, MD MBA The Palliative Medicine Team at [facility] Phone: [phone] Pager: [phone]

## 2012-10-02 DIAGNOSIS — D649 Anemia, unspecified: Secondary | ICD-10-CM

## 2012-10-02 LAB — CBC
MCH: 29.4 pg (ref 26.0–34.0)
MCHC: 33.8 g/dL (ref 30.0–36.0)
MCV: 86.9 fL (ref 78.0–100.0)
Platelets: 120 10*3/uL — ABNORMAL LOW (ref 150–400)
RBC: 4.59 MIL/uL (ref 3.87–5.11)

## 2012-10-02 LAB — BASIC METABOLIC PANEL WITH GFR
BUN: 33 mg/dL — ABNORMAL HIGH (ref 6–23)
CO2: 29 meq/L (ref 19–32)
Calcium: 9.6 mg/dL (ref 8.4–10.5)
Chloride: 95 meq/L — ABNORMAL LOW (ref 96–112)
Creatinine, Ser: 1.38 mg/dL — ABNORMAL HIGH (ref 0.50–1.10)
GFR calc Af Amer: 38 mL/min — ABNORMAL LOW
GFR calc non Af Amer: 33 mL/min — ABNORMAL LOW
Glucose, Bld: 104 mg/dL — ABNORMAL HIGH (ref 70–99)
Potassium: 4.4 meq/L (ref 3.5–5.1)
Sodium: 135 meq/L (ref 135–145)

## 2012-10-02 LAB — GLUCOSE, CAPILLARY
Glucose-Capillary: 105 mg/dL — ABNORMAL HIGH (ref 70–99)
Glucose-Capillary: 197 mg/dL — ABNORMAL HIGH (ref 70–99)
Glucose-Capillary: 135 mg/dL — ABNORMAL HIGH (ref 70–99)

## 2012-10-02 MED ORDER — BISACODYL 10 MG RE SUPP
10.0000 mg | Freq: Every day | RECTAL | Status: DC | PRN
Start: 2012-10-02 — End: 2012-10-06
  Administered 2012-10-02: 10 mg via RECTAL
  Filled 2012-10-02: qty 1

## 2012-10-02 MED ORDER — FUROSEMIDE 20 MG PO TABS
20.0000 mg | ORAL_TABLET | Freq: Every day | ORAL | Status: DC
  Filled 2012-10-02: qty 1

## 2012-10-02 NOTE — Clinical Social Work Placement (Addendum)
Clinical Social Work Department CLINICAL SOCIAL WORK PLACEMENT NOTE 10/02/2012  Patient:  [name], [name]  Account Number:  [id number] Admit date:  09/27/2012  Clinical Social Worker:  Macario Golds, LCSW  Date/time:  10/02/2012 04:00 PM  Clinical Social Work is seeking post-discharge placement for this patient at the following level of care:   SKILLED NURSING   (*CSW will update this form in Epic as items are completed)   10/02/2012  Patient/family provided with [facility] Department of Clinical Social Work's list of facilities offering this level of care within the geographic area requested by the patient (or if unable, by the patient's family).  10/02/2012  Patient/family informed of their freedom to choose among providers that offer the needed level of care, that participate in Medicare, Medicaid or managed care program needed by the patient, have an available bed and are willing to accept the patient.  10/02/2012  Patient/family informed of [facility]' ownership interest in [facility], as well as of the fact that they are under no obligation to receive care at this facility.  PASARR submitted to EDS on 09/30/2012 PASARR number received from EDS on 09/30/2012  FL2 transmitted to all facilities in geographic area requested by pt/family on  10/02/2012 FL2 transmitted to all facilities within larger geographic area on   Patient informed that his/her managed care company has contracts with or will negotiate with  certain facilities, including the following:     Patient/family informed of bed offers received:  10/04/12 Patient chooses bed at  [facility] Physician recommends and patient chooses bed at    Patient to be transferred to [facility]  on  09/1912  Patient to be transferred to facility by  Ambulance  Sharin Mons)  The following physician request were entered in Epic:   Additional Comments: 10/06/12  Patient d/c'd to SNF today. Daughter [name] is aware and went to facility  to sign admit papers.  Patient will open her eyes but is poorly responsive.  Nursing notified and called report.  Family continues to want active treatment and "rehab" for patient despite her continued deterioration and will not consider Hospice. Family has strong expressions of faith and feel that "GOD is going to heal her."  CSW supported daughter and answered questions and concerns by family.  No further CSW intervention indicated; CSW signing off.  Lorri Frederick. [place]  [phone]

## 2012-10-02 NOTE — Progress Notes (Signed)
[facility] PROGRESS NOTE  [name] FAO:[id number] DOB: [birth year] DOA: 09/27/2012 PCP: Cassell Smiles., MD    Brief Narrative Pt is a 79 y.o. ([birth year]) female with multiple medical problems including CHF, atrial flutter,mild dementia and diabetes who was initially admitted to [facility] with chief complaint shortness of breath with desaturation per EMS.  In ED there patient had new onset atrial flutter in the setting of chronic cardiac arhythmia. While at [facility] yesterday 6/12 she reported increased R knee pain. The patient has a long standing history of right knee pain by report from family. Acute exacerbation of the pain occurred 6/12 afternoon with marked change in exam ~3:pm. History cannot be obtained from the patient due to altered mental status. Per family the patient has a history of venous stasis, unclear if just right leg or left leg. The patient has not be ambulatory for years and is wheelchair bound at home. She lives with her family currently  Assessment/Plan:   1.Acute on chronic PAD with acute arterial embolism to right leg -Appreciate vascular assistance -Following discussion with Dr. Imogene Burn family decided 6/13 to proceed with palliative care rather than pt having surgery -appreciate palliative care assistance, on scheduled po meds for pain management-family confirmed nonsurgical treatment and still to decide on alternate feeding, and code status as well as snf vs hospice. -continue heparin for anticoagulation -updated son at bedside, hypotensive with markedly elevated wbc today 6/15>>declining, family has not decided on changing code status yet 2.Acute on chronic systolic congestive heart failure: -  Lasix dose decreased, continue beta blocker, lisinopril as BP tolerates- added hold parameters.  3.Acute respiratory failure with hypoxia: Superimposed on chronic respiratory failure (nighttime oxygen). Wean oxygen as tolerated. Treat heart failure.  4.Atrial flutter with rapid  ventricular response:   Continue Coreg for rate control.  5.History of nonsustained ventricular tachycardia: No recurrence documented, follow.  6.Chronic thrombocytopenia: Secondary to ITP, no treatment required per previous documentation.  7.Diabetes mellitus type 2 diet controlled: Hemoglobin A1c 6.08/2012. on sliding scale insulin.  8.Dementia:stable 9. Leukocytosis  Code Status: full Family Communication: son at bedisde Disposition Plan: pending clinical course   Consultants:  Vascular surgery - Dr Imogene Burn  Palliative care  Procedures:  none  Antibiotics:  none  HPI/Subjective: Somnolent but opens eyes briefly to voice,appears comfortable. Son at bedside and states she ate small breakfast ealier on this am.  Objective: Filed Vitals:   10/01/12 1328 10/01/12 2117 10/02/12 0456 10/02/12 0830  BP: 100/42 125/74 84/72 92/50   Pulse:  72 70 72  Temp:  98.1 F (36.7 C) 98 F (36.7 C)   TempSrc:  Oral Oral   Resp:  18 18 18   Height:      Weight:   40.9 kg (90 lb 2.7 oz)   SpO2:  95% 3% 90%    Intake/Output Summary (Last 24 hours) at 10/02/12 1124 Last data filed at 10/02/12 0850  Gross per 24 hour  Intake     60 ml  Output      0 ml  Net     60 ml   Filed Weights   09/30/12 0534 10/01/12 0457 10/02/12 0456  Weight: 42.8 kg (94 lb 5.7 oz) 41.8 kg (92 lb 2.4 oz) 40.9 kg (90 lb 2.7 oz)    Exam:   General:  Elderly female,somnolent in no resp distress  Cardiovascular: regular, nl S1S2  Respiratory: Decreased BS at bases, no wheezes  Abdomen: soft +BS NT/ND  Extremities:  RLE cool to touch and lower  leg dark/wrinkled, no pulse palpable, no edema.   Data Reviewed: Basic Metabolic Panel:  Recent Labs Lab 09/27/12 1522 09/28/12 0447 09/29/12 0450 10/02/12 0410  NA 142 143 137 135  K 3.9 4.0 3.5 4.4  CL 104 104 97 95*  CO2 26 29 33* 29  GLUCOSE 151* 152* 111* 104*  BUN 17 14 15  33*  CREATININE 0.90 1.00 0.80 1.38*  CALCIUM 9.7 9.5 9.3 9.6   Liver  Function Tests:  Recent Labs Lab 09/27/12 1522  AST 17  ALT 32  ALKPHOS 57  BILITOT 0.7  PROT 6.3  ALBUMIN 2.8*   No results found for this basename: LIPASE, AMYLASE,  in the last 168 hours No results found for this basename: AMMONIA,  in the last 168 hours CBC:  Recent Labs Lab 09/27/12 1522 09/28/12 0447 09/30/12 0515 10/01/12 0750 10/02/12 0410  WBC 9.3 9.5 8.8 11.1* 24.4*  NEUTROABS 7.8*  --   --   --   --   HGB 13.2 12.6 13.7 13.8 13.5  HCT 39.9 38.4 40.9 41.1 39.9  MCV 88.5 88.5 86.1 87.1 86.9  PLT 129* 130* 141* 122* 120*   Cardiac Enzymes: No results found for this basename: CKTOTAL, CKMB, CKMBINDEX, TROPONINI,  in the last 168 hours BNP (last 3 results)  Recent Labs  09/16/12 1342 09/23/12 1545 09/27/12 1522  PROBNP 3786.0* 2560.0* 3230.0*   CBG:  Recent Labs Lab 10/01/12 0642 10/01/12 1059 10/01/12 1615 10/01/12 2138 10/02/12 0626  GLUCAP 128* 242* 202* 99 105*    No results found for this or any previous visit (from the past 240 hour(s)).   Studies: No results found.  Scheduled Meds: . aspirin  81 mg Oral Daily  . carvedilol  6.25 mg Oral BID WC  . darifenacin  7.5 mg Oral Daily  . feeding supplement  237 mL Oral TID BM  . [START ON 10/03/2012] furosemide  20 mg Oral Daily  . HYDROcodone-acetaminophen  1 tablet Oral Q4H  . insulin aspart  0-9 Units Subcutaneous TID WC  . isosorbide mononitrate  60 mg Oral q morning - 10a  . lidocaine  1 patch Transdermal Q24H  . lisinopril  10 mg Oral q morning - 10a  . multivitamin with minerals  1 tablet Oral BH-q7a  . pantoprazole  40 mg Oral q morning - 10a  . potassium chloride SA  20 mEq Oral q morning - 10a  . simvastatin  20 mg Oral QHS  . sodium chloride  3 mL Intravenous Q12H   Continuous Infusions: . sodium chloride 10 mL/hr at 09/30/12 2234  . heparin 800 Units/hr (10/01/12 1033)    Principal Problem:   Acute on chronic systolic heart failure Active Problems:   Coronary artery  disease   GERD (gastroesophageal reflux disease)   Dementia   Diabetes   Atrial flutter   Acute respiratory failure with hypoxia    Time spent: 25    [facility]  [facility] Pager [phone]. If 7PM-7AM, please contact night-coverage at [email or web address], password [facility] 10/02/2012, 11:24 AM  LOS: 5 days

## 2012-10-02 NOTE — Progress Notes (Signed)
Patient ZO:XWRU A Bracknell      DOB: 06/15/1921      EAV:[id number]   Palliative Medicine Team at [facility] Progress Note    Subjective:  Patient currently comfortable.  Son Molly Maduro at the bedside.  Patient able to awaken and tell me she is not in pain.  Son relates family not ready to change code status. Understands she is not doing well and may die.  Patient ate a little breakfast     Filed Vitals:   10/02/12 0830  BP: 92/50  Pulse: 72  Temp:   Resp: 18   Physical exam: General: rousable but sleeping on my arrival, no acute distress compared with yesterday PERRL, EOMI, pleasantly demented CHest: decreased but clear NWG:[id number] rate and rhythm S1,S2 Abd: soft, not tender, positive BS Ext; right limb, cold, no palpable pulse. Neuro: more reserved today, not as restless  Lab Results  Component Value Date   CREATININE 1.38* 10/02/2012   BUN 33* 10/02/2012   NA 135 10/02/2012   K 4.4 10/02/2012   CL 95* 10/02/2012   CO2 29 10/02/2012   Lab Results  Component Value Date   WBC 24.4* 10/02/2012   HGB 13.5 10/02/2012   HCT 39.9 10/02/2012   MCV 86.9 10/02/2012   PLT 120* 10/02/2012   Assessment and plan: 79 yr old african [place] female with an ischemic limb.  Family is aware this is a life limiting illnes.s  They are having trouble making full comfort decisions despite declining health.Continued scheduled hydrocodone with prn morphine for breakthorugh.  I am concerned that we are going to loose Miss Kelty soon.  Family updated through [place].  1.  Full code remains in place at this time  2.  Pain :scheduled hydrocodone q 4 hrs with new mattress and Lidocaine patch has helped in be more comfortable.  3.  Cachexia/anorexia: comfort  Feed.  Disposition:  To be determined  Prognosis: very poor; days   Total  Time 25 min  Kelen Laura L. Ladona Ridgel, MD MBA The Palliative Medicine Team at [facility] Phone: [phone] Pager: [phone]

## 2012-10-02 NOTE — Clinical Social Work Note (Signed)
Clinical Social Work Department BRIEF PSYCHOSOCIAL ASSESSMENT 10/02/2012  Patient:  [name], [name]     Account Number:  [id number]     Admit date:  09/27/2012  Clinical Social Worker:  Verl Blalock  Date/Time:  10/02/2012 04:00 PM  Referred by:  Physician  Date Referred:  10/02/2012 Referred for  SNF Placement  Residential hospice placement   Other Referral:   Patient family determining GOC with Palliative Medicine for appropriate discharge disposition   Interview type:  Family Other interview type:   Patient sleeping - one son at bedside    PSYCHOSOCIAL DATA Living Status:  FAMILY Admitted from facility:   Level of care:   Primary support name:  Deaunna, Olarte  [phone]  Burns,Catherine [phone] Primary support relationship to patient:  CHILD, ADULT Degree of support available:   Strong    CURRENT CONCERNS Current Concerns  Post-Acute Placement  Other - See comment   Other Concerns:   Patient family conversing about patient GOC with possibility of residential hospice, however do not fully understand the terms in which qualifies patient for residential hospice    SOCIAL WORK ASSESSMENT / PLAN Clinical Social Worker met with patient son at bedside to offer support and discuss patient plans at discharge. Patient states that patient was living with one of her daughters prior to hospitalization.  Patient was essentially bed bound with her daughter as the primary caregiver.  Patient son states that patient daughter could no longer take care of patient which began hospitalization.     Patient son is not willing to make definitive decisions at this time without discussing with other family members, but was agreeable to initiate SNF search in [place]. CSW to initiate search and follow up with patient family regarding possible bed offers.  Patient family has clearly verbalized some interest in Residentail Hospice, however at this time we are continuing  aggressive care with patient as a full code.  CSW will follow up with patient family following Palliative Medicine follow up.  CSW remains available for support and will follow up with patient family regarding potential bed offers.   Assessment/plan status:  Psychosocial Support/Ongoing Assessment of Needs Other assessment/ plan:   Information/referral to community resources:   No resources given at this time.  Patient family to follow up with Palliative Medicine to determine patient GOC    PATIENT'S/FAMILY'S RESPONSE TO PLAN OF CARE: Patient oriented to person only sleeping in the bed. Patient son is a permanent figure at bedside per RN with other family members coming and going.  Patient son is not willing to make any decisions without consulting with the rest of his siblings.  Patient family seems to be large and supportive, however unrealistic about patient medical decline and needs at discharge.  Patient family will need to establish patient GOC prior to initiating discharge options.  Patient son is very emotional about patient condition but very willing to engage in the process.

## 2012-10-02 NOTE — Progress Notes (Signed)
ANTICOAGULATION CONSULT NOTE - Follow-Up Consult  Pharmacy Consult for Heparin Indication: ischemic leg  No Known Allergies  Patient Measurements: Height: 5' (152.4 cm) Weight: 90 lb 2.7 oz (40.9 kg) (bed scale) IBW/kg (Calculated) : 45.5  Vital Signs: Temp: 98 F (36.7 C) (06/15 0456) Temp src: Oral (06/15 0456) BP: 92/50 mmHg (06/15 0830) Pulse Rate: 72 (06/15 0830)  Labs:  Recent Labs  09/30/12 0515  10/01/12 0750 10/01/12 1812 10/02/12 0410  HGB 13.7  --  13.8  --  13.5  HCT 40.9  --  41.1  --  39.9  PLT 141*  --  122*  --  120*  HEPARINUNFRC 0.23*  < > 0.91* 0.60 0.39  CREATININE  --   --   --   --  1.38*  < > = values in this interval not displayed.  Estimated Creatinine Clearance: 17.8 ml/min (by C-G formula based on Cr of 1.38).  Assessment: [name] who was admitted to [facility] 09/27/12 with HF exacerbation. Pt developed acute arterial embolism to R leg. Transferred to [facility] 6/13 for vascular evaluation. Family/patient have decided to do palliative care rather than proceeding to OR. Heparin level 0.39 this AM on 800 units/hr of heparin, CBC stable, plts 120<122<141, no overt bleeding noted. Renal function with some decline now with CrCl ~ 15-20.   Goal of Therapy:  Heparin level 0.3-0.7 units/ml Monitor platelets by anticoagulation protocol: Yes   Plan:  -Continue heparin drip at 800 units/hr -Daily CBC/HL -Monitor for bleeding -F/U goals of care  Abran Duke, PharmD Clinical Pharmacist Phone: [phone] Pager: [phone] 10/02/2012 11:01 AM

## 2012-10-02 NOTE — Progress Notes (Signed)
Patient resting quietly with no complaints of pain.  

## 2012-10-03 DIAGNOSIS — I959 Hypotension, unspecified: Secondary | ICD-10-CM

## 2012-10-03 LAB — CBC
Hemoglobin: 11.8 g/dL — ABNORMAL LOW (ref 12.0–15.0)
Platelets: 110 10*3/uL — ABNORMAL LOW (ref 150–400)
RBC: 4.1 MIL/uL (ref 3.87–5.11)
WBC: 18.1 10*3/uL — ABNORMAL HIGH (ref 4.0–10.5)

## 2012-10-03 LAB — GLUCOSE, CAPILLARY
Glucose-Capillary: 89 mg/dL (ref 70–99)
Glucose-Capillary: 102 mg/dL — ABNORMAL HIGH (ref 70–99)

## 2012-10-03 LAB — HEPARIN LEVEL (UNFRACTIONATED)
Heparin Unfractionated: 0.13 IU/mL — ABNORMAL LOW (ref 0.30–0.70)
Heparin Unfractionated: 0.29 IU/mL — ABNORMAL LOW (ref 0.30–0.70)

## 2012-10-03 MED ORDER — SODIUM CHLORIDE 0.9 % IV BOLUS (SEPSIS)
150.0000 mL | Freq: Once | INTRAVENOUS | Status: AC
  Administered 2012-10-03: 150 mL via INTRAVENOUS

## 2012-10-03 MED ORDER — SODIUM CHLORIDE 0.9 % IV SOLN
1.5000 g | Freq: Two times a day (BID) | INTRAVENOUS | Status: DC
  Administered 2012-10-04 – 2012-10-06 (×5): 1.5 g via INTRAVENOUS
  Filled 2012-10-03 (×7): qty 1.5

## 2012-10-03 MED ORDER — SODIUM CHLORIDE 0.9 % IV SOLN
3.0000 g | Freq: Once | INTRAVENOUS | Status: AC
  Administered 2012-10-03: 3 g via INTRAVENOUS
  Filled 2012-10-03 (×2): qty 3

## 2012-10-03 MED ORDER — SODIUM CHLORIDE 0.9 % IV BOLUS (SEPSIS)
250.0000 mL | Freq: Once | INTRAVENOUS | Status: AC
  Administered 2012-10-03: 250 mL via INTRAVENOUS

## 2012-10-03 NOTE — Progress Notes (Signed)
[facility] [place]  At this point, this patient no longer has any any surgical options as I doubt she would survive induction but less a procedure.  I have been clear with the family that the window for intervention was at the time of my prior consult.  I had expressed to the family at the consult my concern that she was in the process of dying.  Unfortunately, this suspicion has come to pass.  Time permitting, I will try to come by tomorrow.     Leonides Sake, MD [facility] [place] Office: [phone] Pager: [phone]  10/03/2012, 7:49 PM

## 2012-10-03 NOTE — Consult Note (Signed)
PULMONARY  / CRITICAL CARE MEDICINE  Name: [name] MRN: [id number] DOB: 1922-02-08    ADMISSION DATE:  09/27/2012 CONSULTATION DATE:  10/03/12  REFERRING MD :  Dr. Suanne Marker PRIMARY SERVICE:  [facility]  CHIEF COMPLAINT:  Hypotension in setting of Gangrene   BRIEF PATIENT DESCRIPTION: [name] admitted 6/10 with progressive SOB in the setting of Afib & hypoxemia.  Recent discharge 6/3 for VT.  6/16 noted to have hypotension & PCCM consulted for evaluation.   SIGNIFICANT EVENTS / STUDIES:   5/30 - 6/3 - Admit for VT, started on amiodarone .................................................................................. 6/10 - Admit to [facility] w/ SOB, Afib & Hypoxemia 6/12 - Eval by Dr. Imogene Burn foracute arterial embolism in RLE, family declined surgical intervention 6/16 - Pt declined, hypotension on floor, PCCM consulted    LINES / TUBES:   CULTURES:   ANTIBIOTICS: Unasyn 6/16>>>  HISTORY OF PRESENT ILLNESS:  [name] with a PMH of CAD s/p stent, PVD/PAD, CHF, ITP, CKD (baseline Cr 0.9-1.5), HLD, and recent admit from 5/30 - 6/3 for VT with discharge on amiodarone.  Post discharge, she had progressive shortness of breath.  6/10 family activated EMS and pt presented to [facility] with afib / RVR, hypoxemia requiring BiPAP.  Rx'd with cardizem and admitted to hospital per [facility].  She complained of new R lower extremity pain.  At baseline, she has been wheelchair bound for years.  Dr. Imogene Burn was consulted for RLE evaluation and offered surgery to family but they declined.  Ongoing efforts in regards to palliative care during admit via [facility] Team.  6/16 patient noted to have decline in vitals to include hypotension, hypothermia and PCCM consulted for evaluation.     PAST MEDICAL HISTORY :  Past Medical History  Diagnosis Date  . Coronary artery disease   . Ventricular tachycardia   . CHF (congestive heart failure)   . GERD (gastroesophageal reflux disease)   . Atrial flutter   . Sick sinus  syndrome   . Dementia   . Diabetes mellitus   . Thrombocytopenia   . Right bundle branch block   . Hyperlipidemia   . TIA (transient ischemic attack)   . Arthritis   . ITP (idiopathic thrombocytopenic purpura) 07/14/2011    Chronic low grade idiopathic thrombocytopenia purpura still not in need of therapy.   Marland Kitchen URI (upper respiratory infection)     history  . Hx: UTI (urinary tract infection)   . Chronic kidney disease   . Pneumonia    Past Surgical History  Procedure Laterality Date  . Coronary angioplasty with stent placement    . Biopsy thyroid     Prior to Admission medications   Medication Sig Start Date End Date Taking? Authorizing Provider  albuterol-ipratropium (COMBIVENT) 18-103 MCG/ACT inhaler Inhale 2 puffs into the lungs every 6 (six) hours as needed for wheezing.   Yes Historical Provider, MD  aspirin 81 MG tablet Take 81 mg by mouth every morning.    Yes Historical Provider, MD  guaiFENesin (MUCINEX) 600 MG 12 hr tablet Take 600 mg by mouth every morning.     Yes Historical Provider, MD  HYDROcodone-acetaminophen (NORCO/VICODIN) 5-325 MG per tablet Take 1 tablet by mouth every 4 (four) hours as needed. 09/20/12  Yes Nimish Normajean Glasgow, MD  ibuprofen (ADVIL,MOTRIN) 200 MG tablet Take 200 mg by mouth every 6 (six) hours as needed for pain.   Yes Historical Provider, MD  isosorbide mononitrate (IMDUR) 60 MG 24 hr tablet Take 60 mg  by mouth every morning.    Yes Historical Provider, MD  lisinopril (PRINIVIL,ZESTRIL) 10 MG tablet Take 10 mg by mouth every morning.    Yes Historical Provider, MD  Multiple Vitamins-Iron (MULTIVITAMIN/IRON) TABS Take 1 tablet by mouth every morning.     Yes Historical Provider, MD  nitroGLYCERIN (NITROSTAT) 0.4 MG SL tablet Place 0.4 mg under the tongue every 5 (five) minutes as needed.   Yes Historical Provider, MD  pantoprazole (PROTONIX) 40 MG tablet Take 40 mg by mouth every morning.    Yes Historical Provider, MD  potassium chloride SA  (K-DUR,KLOR-CON) 20 MEQ tablet Take 20 mEq by mouth every morning.    Yes Historical Provider, MD  simvastatin (ZOCOR) 20 MG tablet Take 20 mg by mouth at bedtime.    Yes Historical Provider, MD  solifenacin (VESICARE) 5 MG tablet Take 5 mg by mouth every morning.    Yes Historical Provider, MD   No Known Allergies  FAMILY HISTORY:  History reviewed. No pertinent family history. SOCIAL HISTORY:  reports that she has never smoked. She has never used smokeless tobacco. She reports that she does not drink alcohol or use illicit drugs.  REVIEW OF SYSTEMS:   Unable to review with patient given physical decline & baseline dementia.   SUBJECTIVE: "I am cold"  VITAL SIGNS: Temp:  [93.8 F (34.3 C)-97.8 F (36.6 C)] 93.8 F (34.3 C) (06/16 0908) Pulse Rate:  [61-86] 86 (06/16 0908) Resp:  [16-18] 16 (06/16 0908) BP: (80-119)/(48-70) 82/48 mmHg (06/16 1306) SpO2:  [92 %-94 %] 92 % (06/16 0603) Weight:  [89 lb 11.2 oz (40.688 kg)] 89 lb 11.2 oz (40.688 kg) (06/16 0603)  PHYSICAL EXAMINATION: Gen-lethargic, mal -nourished, in no distress, normal affect ENT - no lesions, no post nasal drip Neck: No JVD, no thyromegaly, no carotid bruits Lungs: no use of accessory muscles, no dullness to percussion, clear without rales or rhonchi  Cardiovascular: Rhythm regular, heart sounds  normal, no murmurs, no peripheral edema Abdomen: soft and non-tender, no hepatosplenomegaly, BS normal. Musculoskeletal: No deformities, no cyanosis or clubbing, no pulses DP/PT over bLEs Neuro:  Alert,lethargic, non focal, denies pain Skin:  Warm, no lesions/ rash, black below mid leg - tender at transition but no sensation over gangrenous area    Recent Labs Lab 09/28/12 0447 09/29/12 0450 10/02/12 0410  NA 143 137 135  K 4.0 3.5 4.4  CL 104 97 95*  CO2 29 33* 29  BUN 14 15 33*  CREATININE 1.00 0.80 1.38*  GLUCOSE 152* 111* 104*    Recent Labs Lab 10/01/12 0750 10/02/12 0410 10/03/12 0412  HGB 13.8  13.5 11.8*  HCT 41.1 39.9 35.8*  WBC 11.1* 24.4* 18.1*  PLT 122* 120* 110*   No results found.  ASSESSMENT / PLAN:  Hypotension  Hypothermia  Hypotension / Hypothermia in setting of frail elderly female with baseline bed/chair bound, underlying dementia and failure to thrive.  RLE acute arterial embolism.  Suspect element of sepsis + antihypertensives / volume depletion contributing.    Plan: -250 ml NS bolus -hold anti-hypertensives / diuretics -would not recommend initiation of vasopressors or TLC placement - I discussed this with daughter catherine & explianed that in this situation, vasopressors would not be of any benefit - would provide all other medical care including adding antibiotic, fluids -Recommend ongoing discussions with family for progression toward comfort focused care -unasyn for RLE   Acute on Chronic Respiratory Failure  Plan: -DNR -would not recommend any further BiPap given  decreased mental status   PAD - acute on chronic disease.  New RLE arterial embolism this admit.  Given overall state of health, do not feel she would be a surgical candidate for any procedure.    Plan: -continue heparin gtt for now, consider d/c if further decline  Acute on Chronic Systolic CHF Atrial Fibrillation Hx of VT  Plan: -per primary SVC    Canary Brim, NP-C [facility] Pgr: [phone] or [phone]  Care during the described time interval was provided by me and/or other providers on the critical care team.  I have reviewed this patient's available data, including medical history, events of note, physical examination and test results as part of my evaluation  ALVA,RAKESH V.  [phone]  10/03/2012, 1:55 PM

## 2012-10-03 NOTE — Progress Notes (Signed)
ANTICOAGULATION CONSULT NOTE - Follow-Up Consult  Pharmacy Consult for Heparin Indication: ischemic leg  No Known Allergies  Patient Measurements: Height: 5' (152.4 cm) Weight: 90 lb 2.7 oz (40.9 kg) (bed scale) IBW/kg (Calculated) : 45.5  Vital Signs: Temp: 97.4 F (36.3 C) (06/15 2103) Temp src: Oral (06/15 2103) BP: 83/52 mmHg (06/15 2103) Pulse Rate: 68 (06/15 2103)  Labs:  Recent Labs  10/01/12 0750 10/01/12 1812 10/02/12 0410 10/03/12 0412  HGB 13.8  --  13.5 11.8*  HCT 41.1  --  39.9 35.8*  PLT 122*  --  120* PENDING  HEPARINUNFRC 0.91* 0.60 0.39 0.13*  CREATININE  --   --  1.38*  --     Estimated Creatinine Clearance: 17.8 ml/min (by C-G formula based on Cr of 1.38).  Assessment: [name] who was admitted to [facility] 09/27/12 with HF exacerbation. Pt developed acute arterial embolism to R leg. Transferred to [facility] 6/13 for vascular evaluation. Family/patient have decided to do palliative care rather than proceeding to OR. Heparin level 0.13 this AM on 800 units/hr of heparin. CBC stable. No bleeding noted. Renal function with some decline now with CrCl ~ 15-20.   Goal of Therapy:  Heparin level 0.3-0.7 units/ml Monitor platelets by anticoagulation protocol: Yes   Plan:  -Increase heparin drip at 900 units/hr -F/u 8 hr heparin level -F/U goals of care  Christoper Fabian, PharmD, BCPS Clinical pharmacist, pager [phone] 10/03/2012 5:42 AM

## 2012-10-03 NOTE — Progress Notes (Signed)
Pt's BP is 80/51 and unable to get a o2 reading on her finger or forehead.  Was informed by POA that they wanted pt to be partial code no compression no defibrillation, notified Dr. Donna Bernard, order changed.

## 2012-10-03 NOTE — Progress Notes (Signed)
Pt's family is concerned about pt's shoulders shaking involuntarily, they want to know why this is happening.  Text/paged Dr. Donna Bernard, will continue to monitor.

## 2012-10-03 NOTE — Progress Notes (Signed)
Chaplain Note: Pt awake and welcomed me with a smile but did not verbalize. Soon dozed off. Visited with pt's daughter Tyrell Antonio at bedside until son Simonne Come returned. Earlene appears to be holding on to hope that pt will recover.  It was recommended I speak with [name] but she has not yet arrived.  I will check back tomorrow.

## 2012-10-03 NOTE — Progress Notes (Signed)
ANTIBIOTIC CONSULT NOTE - INITIAL  Pharmacy Consult for unasyn Indication: gangrene  No Known Allergies  Patient Measurements: Height: 5' (152.4 cm) Weight: 89 lb 11.2 oz (40.688 kg) (bed scale; overlay mattress) IBW/kg (Calculated) : 45.5   Vital Signs: Temp: 93.8 F (34.3 C) (06/16 0908) Temp src: Axillary (06/16 0908) BP: 82/48 mmHg (06/16 1306) Pulse Rate: 86 (06/16 0908) Intake/Output from previous day: 06/15 0701 - 06/16 0700 In: 386 [P.O.:170; I.V.:216] Out: -  Intake/Output from this shift: Total I/O In: 110 [P.O.:110] Out: -   Labs:  Recent Labs  10/01/12 0750 10/02/12 0410 10/03/12 0412  WBC 11.1* 24.4* 18.1*  HGB 13.8 13.5 11.8*  PLT 122* 120* 110*  CREATININE  --  1.38*  --    Estimated Creatinine Clearance: 17.8 ml/min (by C-G formula based on Cr of 1.38). No results found for this basename: VANCOTROUGH, VANCOPEAK, VANCORANDOM, GENTTROUGH, GENTPEAK, GENTRANDOM, TOBRATROUGH, TOBRAPEAK, TOBRARND, AMIKACINPEAK, AMIKACINTROU, AMIKACIN,  in the last 72 hours   Microbiology: No results found for this or any previous visit (from the past 720 hour(s)).  Medical History: Past Medical History  Diagnosis Date  . Coronary artery disease   . Ventricular tachycardia   . CHF (congestive heart failure)   . GERD (gastroesophageal reflux disease)   . Atrial flutter   . Sick sinus syndrome   . Dementia   . Diabetes mellitus   . Thrombocytopenia   . Right bundle branch block   . Hyperlipidemia   . TIA (transient ischemic attack)   . Arthritis   . ITP (idiopathic thrombocytopenic purpura) 07/14/2011    Chronic low grade idiopathic thrombocytopenia purpura still not in need of therapy.   Marland Kitchen URI (upper respiratory infection)     history  . Hx: UTI (urinary tract infection)   . Chronic kidney disease   . Pneumonia     Medications:  Prescriptions prior to admission  Medication Sig Dispense Refill  . albuterol-ipratropium (COMBIVENT) 18-103 MCG/ACT inhaler  Inhale 2 puffs into the lungs every 6 (six) hours as needed for wheezing.      Marland Kitchen aspirin 81 MG tablet Take 81 mg by mouth every morning.       Marland Kitchen guaiFENesin (MUCINEX) 600 MG 12 hr tablet Take 600 mg by mouth every morning.        Marland Kitchen HYDROcodone-acetaminophen (NORCO/VICODIN) 5-325 MG per tablet Take 1 tablet by mouth every 4 (four) hours as needed.      Marland Kitchen ibuprofen (ADVIL,MOTRIN) 200 MG tablet Take 200 mg by mouth every 6 (six) hours as needed for pain.      . isosorbide mononitrate (IMDUR) 60 MG 24 hr tablet Take 60 mg by mouth every morning.       Marland Kitchen lisinopril (PRINIVIL,ZESTRIL) 10 MG tablet Take 10 mg by mouth every morning.       . Multiple Vitamins-Iron (MULTIVITAMIN/IRON) TABS Take 1 tablet by mouth every morning.        . nitroGLYCERIN (NITROSTAT) 0.4 MG SL tablet Place 0.4 mg under the tongue every 5 (five) minutes as needed.      . pantoprazole (PROTONIX) 40 MG tablet Take 40 mg by mouth every morning.       . potassium chloride SA (K-DUR,KLOR-CON) 20 MEQ tablet Take 20 mEq by mouth every morning.       . simvastatin (ZOCOR) 20 MG tablet Take 20 mg by mouth at bedtime.       . solifenacin (VESICARE) 5 MG tablet Take 5 mg by mouth every morning.       . [  DISCONTINUED] diltiazem (CARDIZEM CD) 120 MG 24 hr capsule Take 1 capsule (120 mg total) by mouth daily.  30 capsule  3   Assessment: [name] is a [name] w/ hypotension/hypothermia, RLE gangrene 2nd RLE acute arterial embolism.  Pharmacy asked to dose Unasyn for gangrene/sepsis. Wt 40.7 kg. Creat cl ~ 18 ml/min. WBC yesterday 24.4, WBC 18.1 today.  Temp 93.8.  Goal of Therapy:  Resolution of infection  Plan:  1. unasyn 3 gm IV x1 then unasyn 1.5 gm IV q12h Herby Abraham, Pharm.D. [phone] 10/03/2012 2:34 PM

## 2012-10-03 NOTE — Progress Notes (Signed)
Patient ZO:XWRU A Needles      DOB: 09/17/1921      EAV:[id number]   Palliative Medicine Team at [facility] Progress Note    Subjective:  Patient resting comfortably. Son Simonne Come at the bedside states at some of her breakfast and she had no compliants.  Overnight, no significant problems with pain control.  I spoke with POA [name].  Family does not want patient do undergo CPR- no shock, no compression.  [name] was ambivalent about intubation until I explained that without CPR that this would not help her mom live a productive life.  She is clear that she wants to consider treatment for hypotension/sepsis and if her breathing gets worse try to intervene and then talk about what the options are.  [name] is still believing that God is going to intervene and heal her mom.  We have talked honestly about the fact that an ischemic limb may contribute to sepsis and death.  They understand the theory but not the practical meaning of this.  We will continue to work with the family on this matter while continuing to try to respect their treatment choices. I have updated Dr. Donna Bernard especially in light of the fact that her Blood pressures have been low.  For now holding antihypertensives has been implemented.  Family will need a multidisciplinary approach to help them make the best choice.  Perhaps followup commentary from Dr. Imogene Burn would help.  Noted increased white blood cell count which is not a good marker , has been conveyed to the family.   Filed Vitals:   10/03/12 0908  BP: 80/51  Pulse: 86  Temp: 93.8 F (34.3 C)  Resp: 16   Physical exam:  Generally: opens eyes and smiles . Tells me she is not in pain.  She is much weaker Chest with basilar crackles and loose cough CVS: irregular, S1, S2 Abd: soft, not tender, positive bowel sounds Ext: cold right lower limb , no paplable pulse, doppler not picking up signal per nursing Neuro: growing weaker, pleasantly demented.  Lab Results  Component  Value Date   WBC 18.1* 10/03/2012   HGB 11.8* 10/03/2012   HCT 35.8* 10/03/2012   MCV 87.3 10/03/2012   PLT 110* 10/03/2012   Lab Results  Component Value Date   CREATININE 1.38* 10/02/2012   BUN 33* 10/02/2012   NA 135 10/02/2012   K 4.4 10/02/2012   CL 95* 10/02/2012   CO2 29 10/02/2012     Assessment and plan: 79 yr old african [place] female with ischemic left leg .  Course now complicated by hypotension,  Increasing white blood cell count.  Family has been presented with the facts surrounding difficult choices.  I would recommend possibly one more visit with commentary from Dr. Imogene Burn to confirm what we have been talking about with family. This may help.  1. Limited Code:  NO compressions, NO defibrillations,  No intubation .  Family would like to treat her afib and blood pressure issues and so at this time her antihypertensives are being held.  If pressors are indicated a conversation should be offered .  I have told them that Vasopressors will only hold off the inevitable and they are aware that she can die from her current condition.  Discussed with Dr. Donna Bernard.  2.  Hypotension: holding antihypertensive.  Communicated to family.  3.  Leukocytosis: communicated implications to family they are not hearing that this is related to potential death.  Will continue to try to support  and work them through the meaning of the findings .  They are waiting for a miracle to occur.  Perhaps one more visit and comment from Dr. Imogene Burn will help them understand we are at an impass.I will ask the chaplain to help [place] with the spiritual piece.  4.  Pain:  Continue scheduled hydrocodone.  She is much more comfortable.  5.  Comfort feeding by hand and by mouth.   Total time 35 min   Kenda Kloehn L. Ladona Ridgel, MD MBA The Palliative Medicine Team at [facility] Phone: [phone] Pager: [phone]

## 2012-10-03 NOTE — Progress Notes (Signed)
Notified by POA that she wanted pt to have vasopressors to raise BP.  Notified Dr. Donna Bernard, who is consulting CCM services.  Current BP is 82/48 manually, order given for 150 mL bolus. Will carry out MD orders and continue to monitor.

## 2012-10-03 NOTE — Progress Notes (Signed)
ANTICOAGULATION CONSULT NOTE - Follow-Up Consult  Pharmacy Consult for Heparin Indication: ischemic leg  No Known Allergies  Patient Measurements: Height: 5' (152.4 cm) Weight: 89 lb 11.2 oz (40.688 kg) (bed scale; overlay mattress) IBW/kg (Calculated) : 45.5  Vital Signs: Temp: 93.8 F (34.3 C) (06/16 0908) Temp src: Axillary (06/16 0908) BP: 83/35 mmHg (06/16 1448) Pulse Rate: 90 (06/16 1448)  Labs:  Recent Labs  10/01/12 0750  10/02/12 0410 10/03/12 0412 10/03/12 1441  HGB 13.8  --  13.5 11.8*  --   HCT 41.1  --  39.9 35.8*  --   PLT 122*  --  120* 110*  --   HEPARINUNFRC 0.91*  < > 0.39 0.13* 0.29*  CREATININE  --   --  1.38*  --   --   < > = values in this interval not displayed.  Estimated Creatinine Clearance: 17.8 ml/min (by C-G formula based on Cr of 1.38).  Assessment: [name] who was admitted to [facility] 09/27/12 with HF exacerbation. Pt developed acute arterial embolism to R leg. Transferred to [facility] 6/13 for vascular evaluation. Family/patient have decided to do palliative care rather than proceeding to OR. Heparin level up to 0.29 after heparin rate increased to 900 units/hr of heparin. CBC stable. No bleeding noted. Renal function with some decline now with CrCl ~ 15-20. HL at the low end of goal.  I am concerned about her hypotension and hypothermia that she may start to accumulate heparin so will keep heparin drip at current rate.  No bleeding reported.  Goal of Therapy:  Heparin level 0.3-0.7 units/ml Monitor platelets by anticoagulation protocol: Yes   Plan:  -continue heparin drip at 900 units/hr - daily HL and CBC Herby Abraham, Pharm.D. [phone] 10/03/2012 3:27 PM

## 2012-10-03 NOTE — Progress Notes (Addendum)
[facility] PROGRESS NOTE  [name] ZOX:[id number] DOB: 12-Aug-1921 DOA: 09/27/2012 PCP: Cassell Smiles., MD    Brief Narrative Pt is a 79 y.o. (1922/03/24) female with multiple medical problems including CHF, atrial flutter,mild dementia and diabetes who was initially admitted to [facility] with chief complaint shortness of breath with desaturation per EMS.  In ED there patient had new onset atrial flutter in the setting of chronic cardiac arhythmia. While at [facility] yesterday 6/12 she reported increased R knee pain. The patient has a long standing history of right knee pain by report from family. Acute exacerbation of the pain occurred 6/12 afternoon with marked change in exam ~3:pm. History cannot be obtained from the patient due to altered mental status. Per family the patient has a history of venous stasis, unclear if just right leg or left leg. The patient has not be ambulatory for years and is wheelchair bound at home. She lives with her family currently  Assessment/Plan:   1.Acute on chronic PAD with acute arterial embolism to right leg -Appreciate vascular assistance -Following discussion with Dr. Imogene Burn family decided 6/13 to proceed with palliative care rather than pt having surgery -family confirmed nonsurgical treatment on follow up  -updated son at bedside and daughter who is POA via phone,  Pt still hypotensive with trending down today 18>> appreciate Dr. Ladona Ridgel as assistance-patient now partial code no compressions no defibrillations no intubation and Samara Deist who is POA confirms this to me but she wants vasopressors and all medications that she might possibly need given -Due to her CHF unable to give aggressive IV fluids -Patient will need to be transferred to a unit for vasopressors I have consulted CCM and Dr. Vassie Loll to see for further recs -Up with Dr. Steward Ros requested reconsult to reassess and talk with patient's family regarding her prognosis at this time -Continue pain  management and scheduled hydrocodone, follow 2.Acute on chronic systolic congestive heart failure: -  Lasix held today, also holding beta blocker, lisinopril due to hypotension  3.Acute respiratory failure with hypoxia: Superimposed on chronic respiratory failure (nighttime oxygen). Wean oxygen as tolerated. Treat heart failure.  4.Atrial flutter with rapid ventricular response:   Continue Coreg for rate control.  5.History of nonsustained ventricular tachycardia: No recurrence documented, follow.  6.Chronic thrombocytopenia: Secondary to ITP, no treatment required per previous documentation.  7.Diabetes mellitus type 2 diet controlled: Hemoglobin A1c 6.08/2012. on sliding scale insulin.  8.Dementia:stable 9. Leukocytosis  Code Status: full Family Communication: son Simonne Come at bedisde and daughter/POA by hone at [phone] Disposition Plan: pending clinical course   Consultants:  Vascular surgery - Dr Imogene Burn  Palliative care  Procedures:  none  Antibiotics:  none  HPI/Subjective: appears comfortable, sleeping. Son Simonne Come at bedside and states she ate small breakfast this am.  Objective: Filed Vitals:   10/02/12 2103 10/03/12 0603 10/03/12 0908 10/03/12 1306  BP: 83/52 119/70 80/51 82/48   Pulse: 68 61 86   Temp: 97.4 F (36.3 C) 97.6 F (36.4 C) 93.8 F (34.3 C)   TempSrc: Oral Oral Axillary   Resp: 17 18 16    Height:      Weight:  40.688 kg (89 lb 11.2 oz)    SpO2: 94% 92%      Intake/Output Summary (Last 24 hours) at 10/03/12 1342 Last data filed at 10/03/12 0915  Gross per 24 hour  Intake    346 ml  Output      0 ml  Net    346 ml   [employer]  10/01/12 0457 10/02/12 0456 10/03/12 0603  Weight: 41.8 kg (92 lb 2.4 oz) 40.9 kg (90 lb 2.7 oz) 40.688 kg (89 lb 11.2 oz)    Exam:   General:  Elderly female,somnolent in no resp distress  Cardiovascular: regular, nl S1S2  Respiratory: Decreased BS at bases, no wheezes  Abdomen: soft +BS  NT/ND  Extremities:  RLE cool to touch and lower leg dark/wrinkled, no pulse palpable, no edema.   Data Reviewed: Basic Metabolic Panel:  Recent Labs Lab 09/27/12 1522 09/28/12 0447 09/29/12 0450 10/02/12 0410  NA 142 143 137 135  K 3.9 4.0 3.5 4.4  CL 104 104 97 95*  CO2 26 29 33* 29  GLUCOSE 151* 152* 111* 104*  BUN 17 14 15  33*  CREATININE 0.90 1.00 0.80 1.38*  CALCIUM 9.7 9.5 9.3 9.6   Liver Function Tests:  Recent Labs Lab 09/27/12 1522  AST 17  ALT 32  ALKPHOS 57  BILITOT 0.7  PROT 6.3  ALBUMIN 2.8*   No results found for this basename: LIPASE, AMYLASE,  in the last 168 hours No results found for this basename: AMMONIA,  in the last 168 hours CBC:  Recent Labs Lab 09/27/12 1522 09/28/12 0447 09/30/12 0515 10/01/12 0750 10/02/12 0410 10/03/12 0412  WBC 9.3 9.5 8.8 11.1* 24.4* 18.1*  NEUTROABS 7.8*  --   --   --   --   --   HGB 13.2 12.6 13.7 13.8 13.5 11.8*  HCT 39.9 38.4 40.9 41.1 39.9 35.8*  MCV 88.5 88.5 86.1 87.1 86.9 87.3  PLT 129* 130* 141* 122* 120* 110*   Cardiac Enzymes: No results found for this basename: CKTOTAL, CKMB, CKMBINDEX, TROPONINI,  in the last 168 hours BNP (last 3 results)  Recent Labs  09/16/12 1342 09/23/12 1545 09/27/12 1522  PROBNP 3786.0* 2560.0* 3230.0*   CBG:  Recent Labs Lab 10/02/12 1102 10/02/12 1608 10/02/12 2128 10/03/12 0601 10/03/12 1109  GLUCAP 197* 141* 135* 89 106*    No results found for this or any previous visit (from the past 240 hour(s)).   Studies: No results found.  Scheduled Meds: . aspirin  81 mg Oral Daily  . carvedilol  6.25 mg Oral BID WC  . darifenacin  7.5 mg Oral Daily  . feeding supplement  237 mL Oral TID BM  . furosemide  20 mg Oral Daily  . HYDROcodone-acetaminophen  1 tablet Oral Q4H  . insulin aspart  0-9 Units Subcutaneous TID WC  . isosorbide mononitrate  60 mg Oral q morning - 10a  . lidocaine  1 patch Transdermal Q24H  . lisinopril  10 mg Oral q morning - 10a   . multivitamin with minerals  1 tablet Oral BH-q7a  . pantoprazole  40 mg Oral q morning - 10a  . potassium chloride SA  20 mEq Oral q morning - 10a  . simvastatin  20 mg Oral QHS  . sodium chloride  150 mL Intravenous Once  . sodium chloride  3 mL Intravenous Q12H   Continuous Infusions: . sodium chloride 10 mL/hr at 09/30/12 2234  . heparin 900 Units/hr (10/03/12 1211)    Principal Problem:   Acute on chronic systolic heart failure Active Problems:   Coronary artery disease   GERD (gastroesophageal reflux disease)   Dementia   Diabetes   Atrial flutter   Acute respiratory failure with hypoxia    Time spent: 35    Daria Mcmeekin C  [facility] Pager [phone]. If 7PM-7AM, please contact night-coverage at  [email or web address], password [facility] 10/03/2012, 1:42 PM  LOS: 6 days

## 2012-10-03 NOTE — Progress Notes (Signed)
Pt asleep. arousable no signs of involuntary temors nor twitchings  noted esp on shoulders and upper and lower extremities. Monitored for signs of possible seizure activity .

## 2012-10-04 DIAGNOSIS — J9600 Acute respiratory failure, unspecified whether with hypoxia or hypercapnia: Secondary | ICD-10-CM

## 2012-10-04 DIAGNOSIS — I739 Peripheral vascular disease, unspecified: Secondary | ICD-10-CM

## 2012-10-04 DIAGNOSIS — I959 Hypotension, unspecified: Secondary | ICD-10-CM

## 2012-10-04 LAB — CBC
HCT: 35.9 % — ABNORMAL LOW (ref 36.0–46.0)
Hemoglobin: 11.7 g/dL — ABNORMAL LOW (ref 12.0–15.0)
MCHC: 32.6 g/dL (ref 30.0–36.0)
WBC: 19.7 10*3/uL — ABNORMAL HIGH (ref 4.0–10.5)

## 2012-10-04 LAB — GLUCOSE, CAPILLARY
Glucose-Capillary: 179 mg/dL — ABNORMAL HIGH (ref 70–99)
Glucose-Capillary: 160 mg/dL — ABNORMAL HIGH (ref 70–99)
Glucose-Capillary: 134 mg/dL — ABNORMAL HIGH (ref 70–99)

## 2012-10-04 LAB — HEPARIN LEVEL (UNFRACTIONATED): Heparin Unfractionated: 0.19 IU/mL — ABNORMAL LOW (ref 0.30–0.70)

## 2012-10-04 MED ORDER — SODIUM CHLORIDE 0.9 % IV BOLUS (SEPSIS)
250.0000 mL | Freq: Once | INTRAVENOUS | Status: AC
  Administered 2012-10-04: 250 mL via INTRAVENOUS

## 2012-10-04 NOTE — Progress Notes (Signed)
[facility] PROGRESS NOTE  [name] ZOX:[id number] DOB: 02-Apr-1922 DOA: 09/27/2012 PCP: Cassell Smiles., MD    Brief Narrative Pt is a 79 y.o. (07-26-1921) female with multiple medical problems including CHF, atrial flutter,mild dementia and diabetes who was initially admitted to [facility] with chief complaint shortness of breath with desaturation per EMS.  In ED there patient had new onset atrial flutter in the setting of chronic cardiac arhythmia. While at [facility] on  6/12 she reported increased R knee pain. The patient has a long standing history of right knee pain by report from family. Acute exacerbation of the pain occurred 6/12 afternoon with marked change in exam ~3:pm. History could not be obtained from the patient due to altered mental status. Per family the patient has a history of venous stasis, unclear if just right leg or left leg. The patient has not be ambulatory for years and is wheelchair bound at home, lived at home with her family. Pt was transferred to [facility] on 6/12 for Vascular surgery consultation>> seen by Dr Imogene Burn and surgery discussed for acute arteial embolism to RLE but family decided on nosurgical management. Palliative care was consulted>> followed and pt made limited code, family/POA wants all meds including vasopressors>> pt developed hypotesion and CCM was consulted and spoke with family>>limited IVF given, started on abx and recommended not starting vasopressors.  Assessment/Plan:   1.Acute on chronic PAD with acute arterial embolism to right leg -Appreciate vascular assistance -Following discussion with Dr. Imogene Burn family decided 6/13 to proceed with palliative care rather than pt having surgery -family confirmed nonsurgical treatment on follow up  -updated son at bedside and daughter who is POA via phone,  Pt still hypotensive with trending down today 18>> appreciate Dr. Ladona Ridgel as assistance-patient now partial code no compressions no defibrillations no intubation and  Samara Deist who is POA confirms this to me but she wants vasopressors and all medications that she might possibly need given -Due to her CHF unable to give aggressive IV fluids -Patient will need to be transferred to a unit for vasopressors I have consulted CCM and Dr. Vassie Loll to see for further recs -Spoke with Dr. Imogene Burn 6/16-and requested reconsult to reassess and talk with patient's family regarding her prognosis at this time -Continue pain management and scheduled hydrocodone, follow -Family/POA at bedside updated, continuing current management 2.Acute on chronic systolic congestive heart failure: -  Lasix  beta blocker, lisinopril on hold due to hypotension  3.Acute respiratory failure with hypoxia: Superimposed on chronic respiratory failure (nighttime oxygen). Wean oxygen as tolerated. Treat heart failure.  4.Atrial flutter with rapid ventricular response:   Coreg as BP tolerates  for rate control.  5.History of nonsustained ventricular tachycardia: No recurrence documented, follow.  6.Chronic thrombocytopenia: Secondary to ITP, no treatment required per previous documentation.  7.Diabetes mellitus type 2 diet controlled: Hemoglobin A1c 6.08/2012. on sliding scale insulin.  8.Dementia:stable 9. Leukocytosis-secondary to #1 continue current abx 10.Hypotension:secondary to #, holding antihypertensives, continue current abx  Code Status: full Family Communication: daughters Earline and Catherine/POA at bedside Disposition Plan: pending clinical course   Consultants:  Vascular surgery - Dr Imogene Burn  Palliative care  Procedures:  none  Antibiotics:  none  HPI/Subjective: Pt grimaces to touching RLE. Daughters at bedside Objective: Filed Vitals:   10/04/12 0700 10/04/12 0849 10/04/12 0900 10/04/12 1406  BP: 95/54 90/52 90/52  95/60  Pulse: 95  95 65  Temp:  96.7 F (35.9 C) 96.7 F (35.9 C) 97 F (36.1 C)  TempSrc:  Axillary  Axillary Axillary  Resp:   18 18  Height:      Weight:       SpO2:        Intake/Output Summary (Last 24 hours) at 10/04/12 1446 Last data filed at 10/04/12 1417  Gross per 24 hour  Intake    690 ml  Output      0 ml  Net    690 ml   Filed Weights   10/01/12 0457 10/02/12 0456 10/03/12 0603  Weight: 41.8 kg (92 lb 2.4 oz) 40.9 kg (90 lb 2.7 oz) 40.688 kg (89 lb 11.2 oz)    Exam:   General:  Elderly female,somnolent in no resp distress  Cardiovascular: regular, nl S1S2  Respiratory: Decreased BS at bases, no wheezes  Abdomen: soft +BS NT/ND  Extremities:  RLE cool to touch and lower leg dark/wrinkled, no pulse palpable, no edema.   Data Reviewed: Basic Metabolic Panel:  Recent Labs Lab 09/27/12 1522 09/28/12 0447 09/29/12 0450 10/02/12 0410  NA 142 143 137 135  K 3.9 4.0 3.5 4.4  CL 104 104 97 95*  CO2 26 29 33* 29  GLUCOSE 151* 152* 111* 104*  BUN 17 14 15  33*  CREATININE 0.90 1.00 0.80 1.38*  CALCIUM 9.7 9.5 9.3 9.6   Liver Function Tests:  Recent Labs Lab 09/27/12 1522  AST 17  ALT 32  ALKPHOS 57  BILITOT 0.7  PROT 6.3  ALBUMIN 2.8*   No results found for this basename: LIPASE, AMYLASE,  in the last 168 hours No results found for this basename: AMMONIA,  in the last 168 hours CBC:  Recent Labs Lab 09/27/12 1522  09/30/12 0515 10/01/12 0750 10/02/12 0410 10/03/12 0412 10/04/12 0555  WBC 9.3  < > 8.8 11.1* 24.4* 18.1* 19.7*  NEUTROABS 7.8*  --   --   --   --   --   --   HGB 13.2  < > 13.7 13.8 13.5 11.8* 11.7*  HCT 39.9  < > 40.9 41.1 39.9 35.8* 35.9*  MCV 88.5  < > 86.1 87.1 86.9 87.3 87.6  PLT 129*  < > 141* 122* 120* 110* 91*  < > = values in this interval not displayed. Cardiac Enzymes: No results found for this basename: CKTOTAL, CKMB, CKMBINDEX, TROPONINI,  in the last 168 hours BNP (last 3 results)  Recent Labs  09/16/12 1342 09/23/12 1545 09/27/12 1522  PROBNP 3786.0* 2560.0* 3230.0*   CBG:  Recent Labs Lab 10/03/12 1109 10/03/12 1647 10/03/12 2137 10/04/12 0716  10/04/12 1107  GLUCAP 106* 116* 102* 85 179*    No results found for this or any previous visit (from the past 240 hour(s)).   Studies: No results found.  Scheduled Meds: . ampicillin-sulbactam (UNASYN) IV  1.5 g Intravenous Q12H  . aspirin  81 mg Oral Daily  . darifenacin  7.5 mg Oral Daily  . feeding supplement  237 mL Oral TID BM  . HYDROcodone-acetaminophen  1 tablet Oral Q4H  . insulin aspart  0-9 Units Subcutaneous TID WC  . lidocaine  1 patch Transdermal Q24H  . multivitamin with minerals  1 tablet Oral BH-q7a  . pantoprazole  40 mg Oral q morning - 10a  . potassium chloride SA  20 mEq Oral q morning - 10a  . simvastatin  20 mg Oral QHS  . sodium chloride  3 mL Intravenous Q12H   Continuous Infusions: . sodium chloride 10 mL/hr at 09/30/12 2234  . heparin 1,000 Units/hr (10/04/12 0846)  Principal Problem:   Acute on chronic systolic heart failure Active Problems:   Coronary artery disease   GERD (gastroesophageal reflux disease)   Dementia   Diabetes   Atrial flutter   Acute respiratory failure with hypoxia    Time spent: 25    [facility]  [facility] Pager [phone]. If 7PM-7AM, please contact night-coverage at [email or web address], password [facility] 10/04/2012, 2:46 PM  LOS: 7 days

## 2012-10-04 NOTE — Progress Notes (Signed)
PULMONARY  / CRITICAL CARE MEDICINE  Name: [name] MRN: [id number] DOB: 06-09-1921    ADMISSION DATE:  09/27/2012 CONSULTATION DATE:  10/03/12  REFERRING MD :  Dr. Suanne Marker PRIMARY SERVICE:  [facility]  CHIEF COMPLAINT:  Hypotension in setting of Gangrene   BRIEF PATIENT DESCRIPTION: [name] admitted 6/10 with progressive SOB in the setting of Afib & hypoxemia.  Recent discharge 6/3 for VT.  6/16 noted to have hypotension & PCCM consulted for evaluation.   SIGNIFICANT EVENTS / STUDIES:   5/30 - 6/3 - Admit for VT, started on amiodarone .................................................................................. 6/10 - Admit to [facility] w/ SOB, Afib & Hypoxemia 6/12 - Eval by Dr. Imogene Burn foracute arterial embolism in RLE, family declined surgical intervention 6/16 - Pt declined, hypotension on floor, PCCM consulted    LINES / TUBES:   CULTURES:   ANTIBIOTICS: Unasyn 6/16>>>  HISTORY OF PRESENT ILLNESS:  [name] with a PMH of CAD s/p stent, PVD/PAD, CHF, ITP, CKD (baseline Cr 0.9-1.5), HLD, and recent admit from 5/30 - 6/3 for VT with discharge on amiodarone.  Post discharge, [name] had progressive shortness of breath.  6/10 family activated EMS and pt presented to [facility] with afib / RVR, hypoxemia requiring BiPAP.  Rx'd with cardizem and admitted to hospital per [facility].  [name] complained of new R lower extremity pain.  At baseline, [name] has been wheelchair bound for years.  Dr. Imogene Burn was consulted for RLE evaluation and offered surgery to family but they declined.  Ongoing efforts in regards to palliative care during admit via [facility] Team.  6/16 patient noted to have decline in vitals to include hypotension, hypothermia and PCCM consulted for evaluation.     SUBJECTIVE:  Pt in less pain  VITAL SIGNS: Temp:  [96.7 F (35.9 C)-98 F (36.7 C)] 96.7 F (35.9 C) (06/17 0900) Pulse Rate:  [90-118] 95 (06/17 0900) Resp:  [16-18] 18 (06/17 0900) BP: (82-95)/(35-57) 90/52 mmHg (06/17  0900) SpO2:  [93 %] 93 % (06/16 2039)  PHYSICAL EXAMINATION: Gen-lethargic, mal -nourished, in no distress, normal affect ENT - no lesions, no post nasal drip Neck: No JVD, no thyromegaly, no carotid bruits Lungs: no use of accessory muscles, no dullness to percussion, clear without rales or rhonchi  Cardiovascular: Rhythm regular, heart sounds  normal, no murmurs, no peripheral edema Abdomen: soft and non-tender, no hepatosplenomegaly, BS normal. Musculoskeletal: No deformities, no cyanosis or clubbing, no pulses DP/PT over bLEs Neuro:  Alert,lethargic, non focal, denies pain Skin:  Warm, no lesions/ rash, black below mid leg - tender at transition but no sensation over gangrenous area    Recent Labs Lab 09/28/12 0447 09/29/12 0450 10/02/12 0410  NA 143 137 135  K 4.0 3.5 4.4  CL 104 97 95*  CO2 29 33* 29  BUN 14 15 33*  CREATININE 1.00 0.80 1.38*  GLUCOSE 152* 111* 104*    Recent Labs Lab 10/02/12 0410 10/03/12 0412 10/04/12 0555  HGB 13.5 11.8* 11.7*  HCT 39.9 35.8* 35.9*  WBC 24.4* 18.1* 19.7*  PLT 120* 110* 91*   No results found.  ASSESSMENT / PLAN:  Hypotension  Hypothermia  Hypotension / Hypothermia in setting of frail elderly female with baseline bed/chair bound, underlying dementia and failure to thrive.  RLE acute arterial embolism.  Suspect element of sepsis + antihypertensives / volume depletion contributing.    Plan: -I agree with current care plan -pt is dnr.   I would focus more on comfort care .  Acute on Chronic Respiratory Failure  Plan: -DNR -would not recommend any further BiPap given decreased mental status   PAD - acute on chronic disease.  New RLE arterial embolism this admit.  Given overall state of health, do not feel [name] would be a surgical candidate for any procedure.    Plan: -continue heparin gtt for now, consider d/c if further decline   pccm will sign off  Dorcas Carrow  10/04/2012, 10:24 AM

## 2012-10-04 NOTE — Progress Notes (Signed)
Pt had 5 beats of vtach. Vs taken. Pt with no c/o chest pains. Notified [facility] [place] with orders received and carried out. NS 250 bolus given. Continued to monitor pt closely

## 2012-10-04 NOTE — Progress Notes (Signed)
ANTICOAGULATION CONSULT NOTE  Pharmacy Consult for Heparin Indication: ischemic leg  No Known Allergies  Patient Measurements: Height: 5' (152.4 cm) Weight: 89 lb 11.2 oz (40.688 kg) (bed scale; overlay mattress) IBW/kg (Calculated) : 45.5  Vital Signs: Temp: 98 F (36.7 C) (06/16 2039) Temp src: Oral (06/16 2039) BP: 95/54 mmHg (06/17 0700) Pulse Rate: 95 (06/17 0700)  Labs:  Recent Labs  10/02/12 0410 10/03/12 0412 10/03/12 1441 10/04/12 0555  HGB 13.5 11.8*  --  11.7*  HCT 39.9 35.8*  --  35.9*  PLT 120* 110*  --  91*  HEPARINUNFRC 0.39 0.13* 0.29* 0.19*  CREATININE 1.38*  --   --   --     Estimated Creatinine Clearance: 17.8 ml/min (by C-G formula based on Cr of 1.38).  Assessment: 79 yo Female with R leg ischemia for heparin  Goal of Therapy:  Heparin level 0.3-0.7 units/ml Monitor platelets by anticoagulation protocol: Yes   Plan:  Increase Heparin 1000 units/hr  Geannie Risen, PharmD, BCPS   10/04/2012 7:20 AM

## 2012-10-04 NOTE — Progress Notes (Signed)
ANTICOAGULATION CONSULT NOTE  Pharmacy Consult for Heparin Indication: ischemic leg  No Known Allergies  Patient Measurements: Height: 5' (152.4 cm) Weight: 89 lb 11.2 oz (40.688 kg) (bed scale; overlay mattress) IBW/kg (Calculated) : 45.5  Vital Signs: Temp: 97 F (36.1 C) (06/17 1406) Temp src: Axillary (06/17 1406) BP: 95/60 mmHg (06/17 1406) Pulse Rate: 65 (06/17 1406)  Labs:  Recent Labs  10/02/12 0410 10/03/12 0412 10/03/12 1441 10/04/12 0555 10/04/12 1732  HGB 13.5 11.8*  --  11.7*  --   HCT 39.9 35.8*  --  35.9*  --   PLT 120* 110*  --  91*  --   HEPARINUNFRC 0.39 0.13* 0.29* 0.19* 0.19*  CREATININE 1.38*  --   --   --   --     Estimated Creatinine Clearance: 17.8 ml/min (by C-G formula based on Cr of 1.38).  Assessment: 79 yo Female with R leg ischemia for heparin.  Heparin level remains subtherapeutic despite rate increase and previously elevated levels on lower drip rate.  No issues with infusion per RN  Goal of Therapy:  Heparin level 0.3-0.7 units/ml Monitor platelets by anticoagulation protocol: Yes   Plan:  Increase Heparin 1100 units/hr Recheck level in ~ 8 hours  Talbert Cage, PharmD 10/04/2012 6:33 PM

## 2012-10-04 NOTE — Progress Notes (Signed)
Palliative Medicine Team SW Introductory visit for psychosocial support. Met with pt, who was pleasantly confused and dozed comfortably during our visit, and pt's dtr [name]. Dtr a bit guarded, intermittently attempting to get pt to drink Ensure. Dtr reported relatively good coping, expressed her sense of honor in caring for pt despite having little family support. Dtr reports pt had "3 boys and 3 girls" and family from [place].  Dtr very faithful, but does not have a formal church community; note chaplain support. Dtr expressed her attempts to "stay faithful in the unseen" and hopes for miraculous recovery for pt. When asked about current plan for pt, dtr reports her uncertainty, referencing pt's immobility. Dtr pleasant, but resistant to attempts to engage below the surface level, often trailing off into prayer. Validated the difficulty of this process, encouraged good self-care and left my contact info and will follow up.   Kennieth Francois, [place] PMT [phone] Pager [phone]

## 2012-10-04 NOTE — Progress Notes (Signed)
Patient ZO:[id number] A Schreiner      DOB: [birth year]      EAV:[id number]   Palliative Medicine Team at [facility] Progress Note    Subjective: Charlina looks very comfortable .  She keeps her eyes closed but is very easily rousable and smiles at comments praising her.  She is eating some for her daughter.  [name] again shared her feeling that God can heal at any time.  Supported her in that knowledge but tried to put it in the context of her mother's specific illness.  Appreciate the other members of the care team seeing and reinforcing this information with the family.  [name] remains adverse to the idea of having Hospice involved, but would allow Supportive or     Filed Vitals:   10/04/12 1406  BP: 95/60  Pulse: 65  Temp: 97 F (36.1 C)  Resp: 18   Physical exam:  General : very comfortable, sleepy but smiles when talked with PERRL, left eye ptosis at baseline, mmm Chest : decreased, , no wheezing , basilar crackles CVS: irregular, S1, S2 Abd: soft, not tender positive bowel sounds Ext: right limb dry gangrene above the knee location Neuro: pleasantly demented  Lab Results  Component Value Date   WBC 19.7* 10/04/2012   HGB 11.7* 10/04/2012   HCT 35.9* 10/04/2012   MCV 87.6 10/04/2012   PLT 91* 10/04/2012   Lab Results  Component Value Date   CREATININE 1.38* 10/02/2012   BUN 33* 10/02/2012   NA 135 10/02/2012   K 4.4 10/02/2012   CL 95* 10/02/2012   CO2 29 10/02/2012     Assessment and plan: 79 yr old female with ischemic right limb. Family has elected to forgo surgery.  Course complicated by hypotension.  Agree with no pressors .  Family feels better having heard this from multiple practitioners.  They remain with misguided hope in healing.  Dr. [name] leaning toward SNF placement and may let supportive or palliative care help with symptom management , but does not want hospice.  Hospice to her has a negative connotation and she is not open to this at this time.  1.  DNR/  DNI  Family would like treatment of rapid heart rate related to afib.  Agree now to no pressors  2.  Pain: patient has been able to reliably take hydrocodone scheduled q 4 hours.  Family have faith in this medication and trust its use.  When she is not able to take po pills can use Roxanol 5 mg/ml (20mg /ml solution)  q 2 -4 hrs depending on the pain level.  Dispense 30 ml  3.  Dysphagia: family accepting risk.  Hand feeding pureed diet for comfort.  Total time 15 min  Jakie Debow L. Ladona Ridgel, MD MBA The Palliative Medicine Team at [facility] Phone: [phone] Pager: [phone]

## 2012-10-04 NOTE — Progress Notes (Addendum)
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  I think the patient is making the transition to the next step in her life, so only comfort care should offered at this point  Available as needed  Subjective    Not really complaining of pain per family  Objective Filed Vitals:   10/04/12 0500 10/04/12 0700 10/04/12 0849 10/04/12 0900  BP: 85/52 95/54 90/52  90/52  Pulse: 98 95  95  Temp:   96.7 F (35.9 C) 96.7 F (35.9 C)  TempSrc:   Axillary Axillary  Resp: 16   18  Height:      Weight:      SpO2:        Intake/Output Summary (Last 24 hours) at 10/04/12 1140 Last data filed at 10/04/12 0600  Gross per 24 hour  Intake    570 ml  Output      0 ml  Net    570 ml   GEN Pt smiling in bed, wakes intermittently VASC  R lower leg blackened and cold, R thigh warm  Laboratory CBC    Component Value Date/Time   WBC 19.7* 10/04/2012 0555   HGB 11.7* 10/04/2012 0555   HCT 35.9* 10/04/2012 0555   PLT 91* 10/04/2012 0555    BMET    Component Value Date/Time   NA 135 10/02/2012 0410   K 4.4 10/02/2012 0410   CL 95* 10/02/2012 0410   CO2 29 10/02/2012 0410   GLUCOSE 104* 10/02/2012 0410   BUN 33* 10/02/2012 0410   CREATININE 1.38* 10/02/2012 0410   CALCIUM 9.6 10/02/2012 0410   GFRNONAA 33* 10/02/2012 0410   GFRAA 38* 10/02/2012 0410    Leonides Sake, MD [facility] Office: [phone] Pager: [phone]  10/04/2012, 11:40 AM

## 2012-10-04 NOTE — Progress Notes (Signed)
Verified by pharmacy that heparin and Unasyn are compatible.

## 2012-10-05 ENCOUNTER — Inpatient Hospital Stay (HOSPITAL_COMMUNITY): Payer: Medicare Other

## 2012-10-05 LAB — CBC
HCT: 35.1 % — ABNORMAL LOW (ref 36.0–46.0)
MCH: 28.9 pg (ref 26.0–34.0)
MCV: 87.3 fL (ref 78.0–100.0)
RDW: 15.4 % (ref 11.5–15.5)
WBC: 20.7 10*3/uL — ABNORMAL HIGH (ref 4.0–10.5)

## 2012-10-05 LAB — GLUCOSE, CAPILLARY: Glucose-Capillary: 109 mg/dL — ABNORMAL HIGH (ref 70–99)

## 2012-10-05 LAB — BASIC METABOLIC PANEL
BUN: 34 mg/dL — ABNORMAL HIGH (ref 6–23)
Calcium: 9.2 mg/dL (ref 8.4–10.5)
Chloride: 102 mEq/L (ref 96–112)
Creatinine, Ser: 1.13 mg/dL — ABNORMAL HIGH (ref 0.50–1.10)
GFR calc Af Amer: 48 mL/min — ABNORMAL LOW (ref >90)

## 2012-10-05 MED ORDER — GUAIFENESIN-DM 100-10 MG/5ML PO SYRP
10.0000 mL | ORAL_SOLUTION | Freq: Four times a day (QID) | ORAL | Status: DC | PRN
  Administered 2012-10-05 – 2012-10-06 (×2): 10 mL via ORAL
  Filled 2012-10-05 (×2): qty 10

## 2012-10-05 MED ORDER — WARFARIN SODIUM 2 MG PO TABS
2.0000 mg | ORAL_TABLET | Freq: Every day | ORAL | Status: DC
  Administered 2012-10-05: 2 mg via ORAL
  Filled 2012-10-05 (×2): qty 1

## 2012-10-05 MED ORDER — GLUCERNA SHAKE PO LIQD: 237.0000 mL | ORAL | Status: DC | PRN

## 2012-10-05 MED ORDER — ENSURE PUDDING PO PUDG
1.0000 | Freq: Three times a day (TID) | ORAL | Status: DC
  Administered 2012-10-05 – 2012-10-06 (×3): 1 via ORAL

## 2012-10-05 MED ORDER — WARFARIN - PHARMACIST DOSING INPATIENT: Freq: Every day | Status: DC

## 2012-10-05 MED ORDER — PATIENT'S GUIDE TO USING COUMADIN BOOK
Freq: Once | Status: AC
  Administered 2012-10-05: 11:00:00
  Filled 2012-10-05: qty 1

## 2012-10-05 MED ORDER — WARFARIN VIDEO
Freq: Once | Status: AC
  Administered 2012-10-05: 11:00:00

## 2012-10-05 MED ORDER — CARVEDILOL 3.125 MG PO TABS
3.1250 mg | ORAL_TABLET | Freq: Two times a day (BID) | ORAL | Status: DC
  Administered 2012-10-05: 3.125 mg via ORAL
  Filled 2012-10-05 (×4): qty 1

## 2012-10-05 MED ORDER — ENOXAPARIN SODIUM 40 MG/0.4ML ~~LOC~~ SOLN
40.0000 mg | SUBCUTANEOUS | Status: DC
  Administered 2012-10-05 – 2012-10-06 (×2): 40 mg via SUBCUTANEOUS
  Filled 2012-10-05 (×2): qty 0.4

## 2012-10-05 MED ORDER — HYDROCODONE-ACETAMINOPHEN 5-325 MG PO TABS: 1.0000 | ORAL_TABLET | Freq: Two times a day (BID) | ORAL | Status: DC | PRN

## 2012-10-05 NOTE — Progress Notes (Signed)
[facility]                                                                                Patient Demographics  [name], is a 79 y.o. female, DOB - Sep 02, 1921, ZOX:[id number], WJX:[id number]  Admit date - 09/27/2012  Admitting Physician Kathlen Mody, MD  Outpatient Primary MD for the patient is Cassell Smiles., MD  LOS - 8   Chief Complaint  Patient presents with  . Respiratory Distress        Assessment & Plan    Brief Narrative   Pt is a 79 y.o. (12-30-1921) female with multiple medical problems including CHF, atrial flutter,mild dementia and diabetes who was initially admitted to [facility] with chief complaint shortness of breath with desaturation per EMS. In ED there patient had new onset atrial flutter in the setting of chronic cardiac arhythmia. While at [facility] on 6/12 she reported increased R knee pain. The patient has a long standing history of right knee pain by report from family. Acute exacerbation of the pain occurred 6/12 afternoon with marked change in exam ~3:pm. History could not be obtained from the patient due to altered mental status. Per family the patient has a history of venous stasis, unclear if just right leg or left leg. The patient has not be ambulatory for years and is wheelchair bound at home, lived at home with her family.  Pt was transferred to [facility] on 6/12 for Vascular surgery consultation>> seen by Dr Imogene Burn and surgery discussed for acute arteial embolism to RLE but family decided on nosurgical management. Palliative care was consulted>> followed and pt made limited code, family/POA wants all meds including vasopressors>> pt developed hypotesion and CCM was consulted and spoke with family>>limited IVF given, started on abx and recommended not starting vasopressors.    Assessment/Plan:   1.Acute on chronic PAD with acute arterial embolism to right leg  -Appreciate vascular assistance  -Following discussion with Dr. Imogene Burn family decided 6/13 to proceed  with palliative care rather than pt having surgery family confirmed nonsurgical treatment on follow up, discussed with Dr. Imogene Burn vascular surgeon 10/05/2012 he recommends and heparin drip can be stopped and any anticoagulation regimen would be appropriate. Will switch her to Lovenox with Coumadin with close monitoring, updated patient's daughter that her prognosis is grim and patient is terminal. Palliative care is following the patient, daughter wishes to continue gentle medical treatment.     2.Acute on chronic systolic congestive heart failure: - Currently appears to have resolved, ACE inhibitor diuretics held for low blood pressure and acute renal failure. Will try low-dose Coreg with certain parameters to hold.     3.Acute respiratory failure with hypoxia likely due to acute on chronic heart failure along with possible pneumonia on CT scan done in the ER: Superimposed on chronic respiratory failure (nighttime oxygen). Wean oxygen as tolerated. Failure much improved after initial diuresis which has been held due to low blood pressures, continue empiric antibiotics. Again patient is terminal.    4.Atrial flutter with rapid ventricular response: Coreg as BP tolerates for rate control.     5.History of nonsustained ventricular tachycardia: No recurrence documented, follow.     6.Chronic thrombocytopenia: Secondary  to ? ITP, no treatment required per previous documentation. Count is stable.    7.Diabetes mellitus type 2 diet controlled: Hemoglobin A1c 6.08/2012. on sliding scale insulin.   CBG (last 3)   Recent Labs  10/04/12 1107 10/04/12 1633 10/04/12 2104  GLUCAP 179* 160* 134*       9. Leukocytosis-secondary to #1 & #3  continue current abx     10.Hypotension:secondary to #, holding antihypertensives with parameters to use Coreg only if needed and diuretics, continue current abx, PRN IVF.     Code Status: DO NOT RESUSCITATE  Family Communication: Is cussed with  the daughter bedside, told her clearly patient's prognosis is grim and she appears terminal.  Disposition Plan: SNF with palliative care/hospice   Procedures CT scan abdomen pelvis, ABIs right leg,   Consults pulmonary critical care signed off, vascular surgery,   DVT Prophylaxis  Lovenox - Coumadin  Lab Results  Component Value Date   INR 1.13 09/16/2012   INR 1.18 05/06/2010   INR 1.01 12/23/2009     Lab Results  Component Value Date   PLT 108* 10/05/2012    Medications  Scheduled Meds: . ampicillin-sulbactam (UNASYN) IV  1.5 g Intravenous Q12H  . aspirin  81 mg Oral Daily  . darifenacin  7.5 mg Oral Daily  . feeding supplement  237 mL Oral TID BM  . HYDROcodone-acetaminophen  1 tablet Oral Q4H  . insulin aspart  0-9 Units Subcutaneous TID WC  . lidocaine  1 patch Transdermal Q24H  . multivitamin with minerals  1 tablet Oral BH-q7a  . pantoprazole  40 mg Oral q morning - 10a  . potassium chloride SA  20 mEq Oral q morning - 10a  . simvastatin  20 mg Oral QHS  . sodium chloride  3 mL Intravenous Q12H   Continuous Infusions: . sodium chloride 10 mL/hr at 09/30/12 2234   PRN Meds:.sodium chloride, acetaminophen, albuterol-ipratropium, bisacodyl, morphine injection, nitroGLYCERIN, ondansetron (ZOFRAN) IV, sodium chloride  Antibiotics     Anti-infectives   Start     Dose/Rate Route Frequency Ordered Stop   10/04/12 0600  ampicillin-sulbactam (UNASYN) 1.5 g in sodium chloride 0.9 % 50 mL IVPB     1.5 g 100 mL/hr over 30 Minutes Intravenous Every 12 hours 10/03/12 1430     10/03/12 1700  Ampicillin-Sulbactam (UNASYN) 3 g in sodium chloride 0.9 % 100 mL IVPB     3 g 100 mL/hr over 60 Minutes Intravenous  Once 10/03/12 1430 10/03/12 1926       Time Spent in minutes  40   Elycia Woodside K M.D on 10/05/2012 at 9:55 AM  Between 7am to 7pm - Pager - [phone]  After 7pm go to [email or web address] - password [email or web address]  And look for the night coverage person covering for me  after hours  [facility] Office  [phone]    Subjective:   [name] today  Is in bed nonverbal but appears comfortable.  Objective:   Filed Vitals:   10/04/12 0900 10/04/12 1406 10/04/12 2124 10/05/12 0439  BP: 90/52 95/60 94/48  110/57  Pulse: 95 65 78 100  Temp: 96.7 F (35.9 C) 97 F (36.1 C) 98.9 F (37.2 C) 98.3 F (36.8 C)  TempSrc: Axillary Axillary Axillary Axillary  Resp: 18 18 18 21   Height:      Weight:    40.869 kg (90 lb 1.6 oz)  SpO2:   96% 96%    Wt Readings from Last 3 Encounters:  10/05/12 40.869 kg (  90 lb 1.6 oz)  09/23/12 50.803 kg (112 lb)  09/20/12 50.803 kg (112 lb)     Intake/Output Summary (Last 24 hours) at 10/05/12 0955 Last data filed at 10/04/12 1858  Gross per 24 hour  Intake    120 ml  Output      0 ml  Net    120 ml    Exam Elderly frail nonverbal African American female lying in hospital bed [place].AT,PERRAL Supple Neck,No JVD, No cervical lymphadenopathy appriciated.  Symmetrical Chest wall movement, Good air movement bilaterally, bilateral basilar rales RRR,No Gallops,Rubs or new Murmurs, No Parasternal Heave +ve B.Sounds, Abd Soft, Non tender, No organomegaly appriciated, No rebound - guarding or rigidity. No Cyanosis, Clubbing or edema, No new Rash or bruise  except right leg is cold and clammy   Data Review   Micro Results No results found for this or any previous visit (from the past 240 hour(s)).  Radiology Reports    Dg Knee 1-2 Views Right  09/16/2012   *RADIOLOGY REPORT*  Clinical Data: Status post fall; right knee pain.  RIGHT KNEE - 1-2 VIEW  Comparison: Right knee radiographs performed 10/05/2011  Findings: There is no evidence of fracture or dislocation.  The joint spaces are preserved.  No significant degenerative change is seen; the patellofemoral joint is grossly unremarkable in appearance.  An enthesophyte is noted at the superior pole of the patella.  A small knee joint effusion is seen.   Scattered vascular calcifications are seen.  The visualized soft tissues are otherwise unremarkable in appearance.  IMPRESSION:  1. No evidence of fracture or dislocation. 2.  Small knee joint effusion noted. 3.  Scattered vascular calcifications seen.   Original Report Authenticated By: Tonia Ghent, M.D.   Ct Angio Ao+bifem W/cm &/or Wo/cm  09/29/2012   *RADIOLOGY REPORT*  Clinical Data:  Right leg pain, suspected ischemia  CT ANGIOGRAPHY OF ABDOMINAL AORTA WITH ILIOFEMORAL RUNOFF  Technique:  Multidetector CT imaging of the abdomen, pelvis and lower extremities was performed using the standard protocol during bolus administration of intravenous contrast.  Multiplanar CT image reconstructions including MIPs were obtained to evaluate the vascular anatomy.  Contrast: OMNIPAQUE IOHEXOL 350 MG/ML SOLN  Comparison:  Noninvasive lower extremity vascular evaluation - earlier same day; chest radiograph - 09/27/2012  Vascular Findings:  Evaluation of the abdominal vasculature is limited secondary to patient motion artifact.  Imaged thoracic aorta: Several tiny (2 mm) penetrated atherosclerotic ulcers are noted within distal aspect of the descending thoracic aorta (images 16, 17 and 20). No periaortic stranding.  Abdominal aorta:  There is scattered mixed slightly irregular mixed calcified and noncalcified atherosclerotic plaque within a normal caliber abdominal aorta.  No abdominal aortic dissection or periaortic stranding.  Celiac artery:  There is a mixture of mixed calcified and noncalcified plaque involving the origin of the celiac artery not definitely resulting in hemodynamically significant stenosis.  SMA:  There is mixed calcified and noncalcified plaque involving the origin and proximal aspect of the SMA which likely approaches 40% luminal narrowing (images 42 and 43, series 5).  Right renal artery:  Duplicated codominant right-sided renal artery is are patent but diminutive throughout their course.   There is suspected narrowing of the origin of both right-sided renal arteries.  Left renal artery:  Solitary; diminutive throughout its course but patent.  IMA:  Patent.  -----------------------------------------------------------  Right Lower Extremity:  Pelvic vasculature:  There is scattered mixed calcified and noncalcified plaque within the right common iliac artery, not  resulting in hemodynamically significant stenosis.  The right internal iliac artery is mildly ectatic measuring approximately 7 mm in diameter proximally (image 88, series five).  The right external iliac artery is widely patent throughout its course.  Right common femoral artery:  There is a near occlusive filling defect throughout the entirety the common femoral artery. The vessel appears mildly expanded at this level suggestive of embolism.  Right deep femoral artery:  There is mixed occlusive or nonocclusive thrombus throughout the proximal aspect of the right deep femoral artery.  Right superficial femoral artery:  There is proximally occlusive and distally partially occlusive thrombus throughout the entirety of the right superficial femoral artery.  Right popliteal artery:  There is near occlusive thrombus throughout the entirety of the right above and below knee popliteal arteries.  Right lower leg:  The anterior tibial artery occludes shortly after its origin.  There is a two vessel runoff to the lower leg however both the posterior tibial and peroneal arteries appear to occlude above the level of the malleoli.  A patent right-sided dorsalis pedis artery is not identified.  -------------------------------------------------------------  Left Lower Extremity:  Pelvic vasculature:  There is mixed calcified and noncalcified plaque involving the origin of the left external iliac artery resulting in approximately 70% luminal narrowing (axial image 73, series 5, coronal image 50, severe 8).  This finding is associated with mild post stenotic  dilatation with the left external iliac artery measuring approximately 1.3 cm in greatest short axis diameter (image 76, series five).  The left internal iliac artery is diseased but patent and of normal caliber.  There is a minimal amount of eccentric atherosclerotic plaque with the distal aspect of the left external iliac artery, not resulting in hemodynamically significant stenosis.  Left common femoral artery:  Widely patent without hemodynamically significant narrowing.  Left deep femoral artery:  Widely patent.  Left superficial femoral artery:  There is a short segment severe (rate of 70%) narrowing of the distal aspect of the left superficial femoral artery at the level of the abductor canal (images 194 - 196). There is scattered eccentric mixed calcified and noncalcified plaque throughout the area of the proximal mid aspect of the left superficial femoral artery, not resulting in hemodynamically significant narrowing.  Left popliteal artery:  Evaluation degraded secondary to patient motion artifact.  There is scattered mixed eccentric calcified and noncalcified plaque primarily involving the above-knee popliteal artery, not definitely resulting in hemodynamically significant narrowing.  Left lower leg:  There is a minimal amount of eccentric calcified plaque involving the proximal aspect the left anterior tibial artery (image 273, series five).  There is a three-vessel runoff to the left foot.  A patent dorsalis pedis artery is identified to the level of the hind foot.  Review of the MIP images confirms the above findings.  ------------------------------------------------------------------- -----  Nonvascular findings:  Evaluation of the abdominal organs is limited to the arterial phase of enhancement. Evaluation of the abdominal organs is degraded secondary to patient motion artifact.  Normal hepatic contour.  There is a peripheral nodular enhancing approximately 2.1 x 1.6 cm hemangioma within the left  lobe of the liver (image 43, series five) as well as a similarly appearing approximately 2.3 x 2.2 cm hemangioma within the dome of the right lobe of the liver (coronal images 48 and 49, series 7). Normal appearance of the gallbladder.  No intra or extrahepatic biliary ductal dilatation.  No ascites.  There is a geographic area of apparent wedge shaped  perfusion involving the posterior superior aspect of the left kidney (image 40).  Bilateral sub centimeter hypoattenuating lesions too small to accurate characterize of favored to represent renal cysts.  No definite renal stones on the post contrast examination.  No urinary obstruction or perinephric stranding.  Note is made of a sub centimeter (approximately 8 mm) nodule within the medial limb of the right adrenal gland.  Too small to accurately characterize.  Normal appearance of the left adrenal gland.  There is an approximately 1.2 x 1.5 cm cyst within the lower spleen (image 32, series five).  The pancreatic duct is dilated regional to the head of the pancreas measuring approximately 8 mm in diameter (image 50, series 5 though this finding is without discrete pancreatic atrophy given the patient age.  The bowel is normal in course and caliber without a discrete area of wall thickening or obstruction.  Normal appearance of the appendix.  No pneumoperitoneum, pneumatosis or portal venous gas. No definite retroperitoneal, mesenteric, pelvic or inguinal lymphadenopathy.  Normal appearance of the pelvic organs for age. No free fluid within the pelvis.  Limited visualization of the lower thorax demonstrates bibasilar heterogeneous air space opacities, right greater than left.  Note is made of a small right-sided pleural effusion. There is a geographic approximately 1.9 x 1.3 cm area of decreased attenuation within the right lower lobe adjacent to the costophrenic angle (image 11, series 5, coronal image 38, series seven) which may represent a small area of developing  pulmonary abscess.  Cardiomegaly, in particular, there is marked enlargement of the right side of the heart.  Coronary calcifications.  No pericardial effusion.  IMPRESSION:  1.  There is presumed embolism within the right common femoral artery extending to involve their entirety of the right superficial femoral and popliteal arteries as well as the proximal aspect of the right deep femoral artery resulting in multifocal occlusive and partially occlusive flow to the right lower extremity.  There is an atretic two-vessel runoff to the level of the malleoli via the posterior tibial and peroneal arteries.  2.  There is an approximately 2.7 x 2.2 cm geographic region of decreased attenuation within the posterior superior aspect of the left kidney.  Differential considerations include a focal area of pyelonephritis versus an underlying renal mass, though in current clinical setting, renal infarct is suspected.  3.  No definite evidence of embolism to the left lower extremity, though there is an approximately 70% narrowing involving the proximal aspect of the left common iliac artery with associated mild post stenotic dilatation as well as short segment severe narrowing of the distal aspect of the left superficial femoral artery at the level of the abductor canal.  Three-vessel runoff to the left foot.  The left-sided dorsalis pedis artery is identified to the level of the hind foot.  4.  Scattered irregular atherosclerotic plaque within a mildly tortuous but normal caliber abdominal aorta.  No discrete filling defects within the abdominal aorta.  5.  Cardiomegaly, in particular, there is marked enlargement of the right side of the heart.  In the setting of presumed distal embolism, further evaluation with cardiac echo is recommended.  6.  Air space opacities within the imaged bilateral lower lobes worrisome for multifocal infection with possible approximately 1.9 cm area of relative decreased attenuation within the right  lower lobe worrisome for a developing pulmonary parenchymal abscess and associated small right-sided parapneumonic pleural effusion.  7.  Nonspecific pancreatic ductal dilatation, the etiology of which is not  detected on this examination.  Further evaluation with non emergent MRCP may be obtained as clinically indicated.  Above findings discussed with Dr. Rubin Payor at 2018.   Original Report Authenticated By: Tacey Ruiz, MD   US Arterial Seg Single  09/29/2012   *RADIOLOGY REPORT*  Clinical Data:  Right leg pain and coldness, rest pain, history diabetes, hyperlipidemia, CHF, ITP, coronary artery disease  ULTRASOUND ANKLE/BRACHIAL INDICES BILATERAL  Comparison: 11/28/2004  Findings: Following pressures obtained, in mm Hg: Left brachial:          127 Left dorsalis pedis:          123 Left posterior tibial:  no flow detected Left ABI:               0.97  Right brachial:         118 Right dorsalis pedis:   no flow detected Right posterior tibial: no flow detected Right ABI:              N/A  Unable to obtain Doppler wave forms at the right superficial femoral or popliteal arteries as well.  IMPRESSION: Normal left ABI 0.97. No arterial flow could be detected within the right superficial femoral, popliteal, dorsalis pedis, or posterior tibial arteries. Findings either represent right lower extremity proximal arterial occlusion or high-grade stenosis with trickle flow which could not be detected.  Critical Value/emergent results were called by telephone at the time of interpretation on 09/29/2012 at 1710 hours to Dr. Irene Limbo, who verbally acknowledged these results.   Original Report Authenticated By: Ulyses Southward, M.D.   Dg Chest Portable 1 View  09/27/2012   *RADIOLOGY REPORT*  Clinical Data: Respiratory distress  PORTABLE CHEST - 1 VIEW  Comparison: Portable chest x-ray of 09/24/2046  Findings: Opacities overlie the left mid upper chest many of which most likely artifactual.  However due to these overlapping  opacities, pneumonia cannot be excluded.  The right lung is clear. Cardiomegaly is stable.  Opacities in the right lung base appear stable and may represent aspirated material, stable since CT from 2005.  IMPRESSION:  1.  Stable cardiomegaly. 2.  Artifacts overlie the left chest.  No definite pneumonia, but the left mid and upper lung is difficult to assess due to the overlapping artifactual opacities.   Original Report Authenticated By: Dwyane Dee, M.D.      [facility]  Recent Labs Lab 10/01/12 4098 10/02/12 0410 10/03/12 0412 10/04/12 0555 10/05/12 0505  WBC 11.1* 24.4* 18.1* 19.7* 20.7*  HGB 13.8 13.5 11.8* 11.7* 11.6*  HCT 41.1 39.9 35.8* 35.9* 35.1*  PLT 122* 120* 110* 91* 108*  MCV 87.1 86.9 87.3 87.6 87.3  MCH 29.2 29.4 28.8 28.5 28.9  MCHC 33.6 33.8 33.0 32.6 33.0  RDW 14.8 14.9 15.1 15.1 15.4    Chemistries   Recent Labs Lab 09/29/12 0450 10/02/12 0410 10/05/12 0505  NA 137 135 138  K 3.5 4.4 3.7  CL 97 95* 102  CO2 33* 29 29  GLUCOSE 111* 104* 161*  BUN 15 33* 34*  CREATININE 0.80 1.38* 1.13*  CALCIUM 9.3 9.6 9.2   ------------------------------------------------------------------------------------------------------------------ estimated creatinine clearance is 21.8 ml/min (by C-G formula based on Cr of 1.13). ------------------------------------------------------------------------------------------------------------------ No results found for this basename: HGBA1C,  in the last 72 hours ------------------------------------------------------------------------------------------------------------------ No results found for this basename: CHOL, HDL, LDLCALC, TRIG, CHOLHDL, LDLDIRECT,  in the last 72 hours ------------------------------------------------------------------------------------------------------------------ No results found for this basename: TSH, T4TOTAL, FREET3, T3FREE, THYROIDAB,  in the last 72  hours ------------------------------------------------------------------------------------------------------------------  No results found for this basename: VITAMINB12, FOLATE, FERRITIN, TIBC, IRON, RETICCTPCT,  in the last 72 hours  Coagulation profile No results found for this basename: INR, PROTIME,  in the last 168 hours  No results found for this basename: DDIMER,  in the last 72 hours  Cardiac Enzymes No results found for this basename: CK, CKMB, TROPONINI, MYOGLOBIN,  in the last 168 hours ------------------------------------------------------------------------------------------------------------------ No components found with this basename: POCBNP,

## 2012-10-05 NOTE — Progress Notes (Addendum)
ANTICOAGULATION CONSULT NOTE  Pharmacy Consult for Heparin>>lovenox/coumadin Indication: RLE DVT  No Known Allergies  Patient Measurements: Height: 5' (152.4 cm) Weight: 90 lb 1.6 oz (40.869 kg) (bed scale) IBW/kg (Calculated) : 45.5  Vital Signs: Temp: 98.1 F (36.7 C) (06/18 0900) Temp src: Axillary (06/18 0900) BP: 101/49 mmHg (06/18 0900) Pulse Rate: 92 (06/18 0900)  Labs:  Recent Labs  10/03/12 0412  10/04/12 0555 10/04/12 1732 10/05/12 0505 10/05/12 1030  HGB 11.8*  --  11.7*  --  11.6*  --   HCT 35.8*  --  35.9*  --  35.1*  --   PLT 110*  --  91*  --  108*  --   LABPROT  --   --   --   --   --  14.1  INR  --   --   --   --   --  1.10  HEPARINUNFRC 0.13*  < > 0.19* 0.19* 0.18*  --   CREATININE  --   --   --   --  1.13*  --   < > = values in this interval not displayed.  Estimated Creatinine Clearance: 21.8 ml/min (by C-G formula based on Cr of 1.13).  Assessment: 79 yo Female with R leg embolism. Patient was started on IV heparin and now will transition to lovenox/warfarin with hopes of discharging to facility. Unfortunately will need INR today prior to starting coumadin.   Noted patient has reduced renal clearance and very low body weight. Will be very conservative with lovenox and warfarin dosing. CBC has remained stable.  Goal of Therapy:  INR 2-3 Anti-Xa level 0.6-1.2 units/ml 4hrs after LMWH dose given Monitor platelets by anticoagulation protocol: Yes   Plan:  Discontinue heparin gtt now, start lovenox 40mg  q24 for DVT treatment at noon today - would recommend d/c once INR>2 Warfarin 2mg  daily with INR daily while here, would recommend checking INR Friday if able to ensure patient is not rapidly accumulating.  Sheppard Coil PharmD., BCPS Clinical Pharmacist Pager [phone] 10/05/2012 11:51 AM  Addendum; Baseline INR 1.1 Continue with above orders 10/05/2012 11:51 AM

## 2012-10-05 NOTE — Progress Notes (Signed)
NUTRITION FOLLOW UP  Intervention:   1. Change Glucerna shakes to PRN  2.  Add Ensure pudding for medication administration   Nutrition Dx:   Decreased sodium needs related to heart failure as evidenced by Acute on chronic systolic heart failure  Goal:   Meet nutrition needs as able   Monitor:   PO intake, weights, labs, plan of care   Assessment:   Pt transferred to [facility] from [facility] for consult to [facility] with acute on chronic PAD, with acute embolism to R CFA. Family decided for a palliative approach and no surgical intervention at this time.  Palliative care was consulted but family wants to continue supportive care. Diet has been changed to puree, desiring comfort feeding.  Per notes, family provides medication in Ensure pudding. No family available at time of RD visit. Pt resting peacefully, did not wake pt.   Height: Ht Readings from Last 1 Encounters:  09/27/12 5' (1.524 m)    Weight Status:   Wt Readings from Last 1 Encounters:  10/05/12 90 lb 1.6 oz (40.869 kg)    Re-estimated needs:  Kcal: 1200-1440  Protein: 53-62 gr  Fluid: per MD goals  Skin: intact   Diet Order: Dysphagia 1, thin liquids   Intake/Output Summary (Last 24 hours) at 10/05/12 0954 Last data filed at 10/04/12 1858  Gross per 24 hour  Intake    120 ml  Output      0 ml  Net    120 ml    Last BM: 6/16   Labs:   Recent Labs Lab 09/29/12 0450 10/02/12 0410 10/05/12 0505  NA 137 135 138  K 3.5 4.4 3.7  CL 97 95* 102  CO2 33* 29 29  BUN 15 33* 34*  CREATININE 0.80 1.38* 1.13*  CALCIUM 9.3 9.6 9.2  GLUCOSE 111* 104* 161*    CBG (last 3)   Recent Labs  10/04/12 1107 10/04/12 1633 10/04/12 2104  GLUCAP 179* 160* 134*    Scheduled Meds: . ampicillin-sulbactam (UNASYN) IV  1.5 g Intravenous Q12H  . aspirin  81 mg Oral Daily  . darifenacin  7.5 mg Oral Daily  . feeding supplement  237 mL Oral TID BM  . HYDROcodone-acetaminophen  1 tablet Oral Q4H  . insulin aspart  0-9 Units  Subcutaneous TID WC  . lidocaine  1 patch Transdermal Q24H  . multivitamin with minerals  1 tablet Oral BH-q7a  . pantoprazole  40 mg Oral q morning - 10a  . potassium chloride SA  20 mEq Oral q morning - 10a  . simvastatin  20 mg Oral QHS  . sodium chloride  3 mL Intravenous Q12H    Continuous Infusions: . sodium chloride 10 mL/hr at 09/30/12 2234    Clarene Duke RD, LDN Pager [phone] After Hours pager [phone]

## 2012-10-05 NOTE — Progress Notes (Signed)
ANTICOAGULATION CONSULT NOTE  Pharmacy Consult for Heparin Indication: ischemic leg  No Known Allergies  Patient Measurements: Height: 5' (152.4 cm) Weight: 90 lb 1.6 oz (40.869 kg) (bed scale) IBW/kg (Calculated) : 45.5  Vital Signs: Temp: 98.3 F (36.8 C) (06/18 0439) Temp src: Axillary (06/18 0439) BP: 110/57 mmHg (06/18 0439) Pulse Rate: 100 (06/18 0439)  Labs:  Recent Labs  10/03/12 0412  10/04/12 0555 10/04/12 1732 10/05/12 0505  HGB 11.8*  --  11.7*  --  11.6*  HCT 35.8*  --  35.9*  --  35.1*  PLT 110*  --  91*  --  108*  HEPARINUNFRC 0.13*  < > 0.19* 0.19* 0.18*  < > = values in this interval not displayed.  Estimated Creatinine Clearance: 17.8 ml/min (by C-G formula based on Cr of 1.38).  Assessment: 79 yo Female with R leg ischemia for heparin  Goal of Therapy:  Heparin level 0.3-0.7 units/ml Monitor platelets by anticoagulation protocol: Yes   Plan:  Increase Heparin 1300 units/hr Check heparin level in 8 hours.  Geannie Risen, PharmD, BCPS   10/05/2012 6:06 AM

## 2012-10-05 NOTE — Progress Notes (Signed)
CSW spoke to patient's daughter [name] today to give her bed offers for SNF units- [facility] as they requested.  Daughter then verbalized "why  [facility] Rough' she go to a Hospice Home?"  CSW discussed in great detail her recent meeting with Dr. Ladona Ridgel and it is clearly stated  that she desires patient to continue to receive active treatment and feels that "God will heal her".  She does not desire comfort care and was very upset that it would even be "suggested" that she let her mother die.  CSW spoke in length with daughter about [facility] and [facility] and the mission of Hospice.  This was then relayed to her son Jonny Ruiz who defers to his sister as [place] but has concerns that a SNF will not be able to adequately take care of his mother.  He was encouraged to talk to [name] as to her wishes for full symptom management and aggressive care.  She does, however, agree to a partial DNR.  Discussed with family that patient is medically ready and that they will need to choose a SNF for patient for d/c tomorrow.  Both son and daughter appeared very distraught about this; son stated "I don't want my mother to leave the hospital until her cough and wheezing are fixed."  CSW discussed that SNF's provide 24 hour/ 7day a week nursing services and if MD determines medical stability- they will be able to provide for her needs.  Son appeared to more comfortable with SNF placement after this discussion as was daughter [name].  CSW will  Follow up with family in the a.m to complete dc plan to SNF. Lorri Frederick. [place]  [phone]

## 2012-10-05 NOTE — Progress Notes (Signed)
OT Cancellation Note  Patient Details Name: [name] MRN: [id number] DOB: 1922/01/05   Cancelled Treatment:    Reason Eval/Treat Not Completed:  Pt receiving palliative care and primarily comfort care with family choosing to forgo surgery for ischemic R limb.  No acute OT needs.  Signing off.  Evern Bio 10/05/2012, 8:03 AM

## 2012-10-06 LAB — CBC
MCH: 29.6 pg (ref 26.0–34.0)
MCHC: 33.9 g/dL (ref 30.0–36.0)
MCV: 87.1 fL (ref 78.0–100.0)
Platelets: 111 10*3/uL — ABNORMAL LOW (ref 150–400)
RDW: 15.6 % — ABNORMAL HIGH (ref 11.5–15.5)

## 2012-10-06 LAB — BASIC METABOLIC PANEL
CO2: 28 mEq/L (ref 19–32)
Chloride: 104 mEq/L (ref 96–112)
Creatinine, Ser: 0.98 mg/dL (ref 0.50–1.10)
Potassium: 4.4 mEq/L (ref 3.5–5.1)
Sodium: 140 mEq/L (ref 135–145)

## 2012-10-06 LAB — PROTIME-INR
INR: 1.21 (ref 0.00–1.49)
Prothrombin Time: 15.1 seconds (ref 11.6–15.2)

## 2012-10-06 MED ORDER — WARFARIN - PHARMACIST DOSING INPATIENT: Status: DC

## 2012-10-06 MED ORDER — WARFARIN SODIUM 2 MG PO TABS: 2.0000 mg | ORAL_TABLET | Freq: Every day | ORAL | Status: DC

## 2012-10-06 MED ORDER — HYDROCODONE-ACETAMINOPHEN 5-325 MG PO TABS: 1.0000 | ORAL_TABLET | ORAL | Status: DC | PRN

## 2012-10-06 MED ORDER — DEXTROSE 50 % IV SOLN
25.0000 mL | Freq: Once | INTRAVENOUS | Status: AC
  Administered 2012-10-06: 25 mL via INTRAVENOUS

## 2012-10-06 MED ORDER — CARVEDILOL 3.125 MG PO TABS: 3.1250 mg | ORAL_TABLET | Freq: Two times a day (BID) | ORAL | Status: DC

## 2012-10-06 MED ORDER — AMOXICILLIN-POT CLAVULANATE 500-125 MG PO TABS: 1.0000 | ORAL_TABLET | Freq: Three times a day (TID) | ORAL | Status: DC

## 2012-10-06 MED ORDER — ENOXAPARIN SODIUM 40 MG/0.4ML ~~LOC~~ SOLN: 40.0000 mg | SUBCUTANEOUS | Status: DC

## 2012-10-06 MED ORDER — DEXTROSE 50 % IV SOLN
INTRAVENOUS | Status: AC
  Filled 2012-10-06: qty 50

## 2012-10-06 NOTE — Discharge Summary (Addendum)
[facility]                                                                                   [name], is a 79 y.o. female  DOB Jun 12, 1921  MRN [id number].  Admission date:  09/27/2012  Discharge Date:  10/06/2012  Primary MD  Cassell Smiles., MD  Admitting Physician  Kathlen Mody, MD  Admission Diagnosis  CHF (congestive heart failure), chronic, diastolic [428.32]  Discharge Diagnosis     Principal Problem:   Acute on chronic systolic heart failure Active Problems:   Coronary artery disease   GERD (gastroesophageal reflux disease)   Dementia   Diabetes   Atrial flutter   Acute respiratory failure with hypoxia   Past Medical History  Diagnosis Date  . Coronary artery disease   . Ventricular tachycardia   . CHF (congestive heart failure)   . GERD (gastroesophageal reflux disease)   . Atrial flutter   . Sick sinus syndrome   . Dementia   . Diabetes mellitus   . Thrombocytopenia   . Right bundle branch block   . Hyperlipidemia   . TIA (transient ischemic attack)   . Arthritis   . ITP (idiopathic thrombocytopenic purpura) 07/14/2011    Chronic low grade idiopathic thrombocytopenia purpura still not in need of therapy.   Marland Kitchen URI (upper respiratory infection)     history  . Hx: UTI (urinary tract infection)   . Chronic kidney disease   . Pneumonia     Past Surgical History  Procedure Laterality Date  . Coronary angioplasty with stent placement    . Biopsy thyroid       Recommendations for primary care physician for things to follow:   Follow CBC, BMP and INR closely.   Discharge Diagnoses:   Principal Problem:   Acute on chronic systolic heart failure Active Problems:   Coronary artery disease   GERD (gastroesophageal reflux disease)   Dementia   Diabetes   Atrial flutter   Acute respiratory failure with hypoxia    Discharge Condition: Currently stable but extremely poor long term prognosis, patient expected to die soon, this has  been made clear to the family members by me personally.   Diet recommendation: See Discharge Instructions below   Consults vascular surgery, palliative care, pulmonary critical care    History of present illness and  Hospital Course:     Kindly see H&P for history of present illness and admission details, please review complete Labs, Consult reports and Test reports for all details in brief [name], is a 79 y.o. female, patient with history of chronic systolic heart failure EF 45% per echo gram done in 2014/08/24advanced age with dementia, atrial flutter, GERD, CAD, low-grade ITP, is been wheelchair-bound at home for several years and lives with her family members. Patient was initially admitted [facility] with shortness of breath likely from aspiration pneumonia on a chronic basis along with acute on chronic systolic heart failure, at [facility] she developed pain and discomfort in her right leg workup suggested acute right leg arterial embolism, patient was then transferred to [facility] where vascular surgeon  Dr. Imogene Burn was consulted, due to patient's advanced age, frail and cachectic baseline status vascular surgery and pulmonary critical care were also involved both suggested that patient be treated with medical treatment directed towards comfort and that hospice and palliative care be involved.   Palliative care was involved and family continue to wish medical treatment, patient clearly is developing gangrene in her right leg, also it appears that patient has been chronically aspirating, per family's wishes patient has been continued on anticoagulation, gentle empiric antibiotics are being continued, however patient's right leg continues to get your weight as expected, also do to continued microaspiration patient's condition is expected to decline gradually, this has been made clear to the family members however they are expecting divine intervention and say  that she would be healed by the lord. I have clearly told him that her prognosis remains extremely poor and that I fully expect her to pass away soon. This point patient will be continued on Lovenox with Coumadin overlap, she will need continued dose monitoring and adjustment by pharmacist. Continue to involve palliative care, transitioned to full hospice if patient continues to decline which I expect to happen.  For her continued microaspiration she was seen by speech therapy who recommended pured diet and thin liquids with full assistance and aspiration precautions, please continue to involve speech therapy, I will give her a few more days of empiric Augmentin. Note the family tends to feed the patient by themselves in unsafe manner, continue counseling them not to do so. I have had detailed discussion with patient's son bedside,I have advised them that feeding tube will not ensure no aspiration in the future, and I frankly do not think patient who is actively dying should be subjected to another procedure compromising her comfort. She should get food for comfort with aspiration precautions and feeding assistance.   Acute on chronic respiratory failure requiring 2 L nasal cannula oxygen on intermittent basis, likely do to ongoing persistent microaspiration along with acute on chronic systolic heart failure, heart failure currently stable, temperature antibiotics and aspiration precautions as above.  Chronic thrombocytopenia secondary to low-grade ITP, counts are stable, continue to monitor platelet counts intermittently.  Diabetes mellitus type 2, if feeding is consistent and low-dose sliding scale insulin can be considered.  Hypertension. Blood pressure currently hanging on the lower side no blood pressure medications for now except Coreg, should be held if systolic blood pressure less than 100.   Acute renal failure. Improving with gentle hydration. Continue to hydrate the patient orally as best as  possible.   Atrial flutter, low-dose Coreg if tolerated by blood pressure.    Today   Subjective:   [name] today remains minimally verbal, denies any headache or chest pain, does not appear to be in any discomfort.  Objective:   Blood pressure 105/62, pulse 87, temperature 97.8 F (36.6 C), temperature source Oral, resp. rate 18, height 5' (1.524 m), weight 39.2 kg (86 lb 6.7 oz), SpO2 95.00%.   Intake/Output Summary (Last 24 hours) at 10/06/12 1100 Last data filed at 10/06/12 0700  Gross per 24 hour  Intake      0 ml  Output      0 ml  Net      0 ml    Exam Awake alert, appears frail and cachectic, No new F.N deficits, Normal affect [place].AT,PERRAL Supple Neck,No JVD, No cervical lymphadenopathy appriciated.  Symmetrical Chest wall movement, Good air movement bilaterally, CTAB RRR,No Gallops,Rubs or new Murmurs, No Parasternal Heave +  ve B.Sounds, Abd Soft, Non tender, No organomegaly appriciated, No rebound -guarding or rigidity. No Cyanosis, Clubbing or edema, No new Rash or bruise, right leg is black with no pulses and cold to touch  Data Review   Major procedures and Radiology Reports - PLEASE review detailed and final reports for all details in brief -       Dg Chest 1 View  09/16/2012   *RADIOLOGY REPORT*  Clinical Data: Status post fall; concern for chest injury.  CHEST - 1 VIEW  Comparison: Chest radiograph performed 07/04/2012  Findings: The lungs are well-aerated.  Pulmonary vascularity is at the upper limits of normal.  Mild basilar atelectasis is noted. There is no evidence of pleural effusion or pneumothorax.  The cardiomediastinal silhouette is enlarged; calcification is noted in the aortic arch.  No acute osseous abnormalities are seen.  IMPRESSION: Mild bibasilar atelectasis noted; lungs otherwise clear. Cardiomegaly noted.  No displaced rib fractures seen.   Original Report Authenticated By: Tonia Ghent, M.D.   Dg Knee 1-2 Views Right  09/16/2012    *RADIOLOGY REPORT*  Clinical Data: Status post fall; right knee pain.  RIGHT KNEE - 1-2 VIEW  Comparison: Right knee radiographs performed 10/05/2011  Findings: There is no evidence of fracture or dislocation.  The joint spaces are preserved.  No significant degenerative change is seen; the patellofemoral joint is grossly unremarkable in appearance.  An enthesophyte is noted at the superior pole of the patella.  A small knee joint effusion is seen.  Scattered vascular calcifications are seen.  The visualized soft tissues are otherwise unremarkable in appearance.  IMPRESSION:  1. No evidence of fracture or dislocation. 2.  Small knee joint effusion noted. 3.  Scattered vascular calcifications seen.   Original Report Authenticated By: Tonia Ghent, M.D.   Ct Angio Ao+bifem W/cm &/or Wo/cm  09/29/2012   *RADIOLOGY REPORT*  Clinical Data:  Right leg pain, suspected ischemia  CT ANGIOGRAPHY OF ABDOMINAL AORTA WITH ILIOFEMORAL RUNOFF  Technique:  Multidetector CT imaging of the abdomen, pelvis and lower extremities was performed using the standard protocol during bolus administration of intravenous contrast.  Multiplanar CT image reconstructions including MIPs were obtained to evaluate the vascular anatomy.  Contrast: OMNIPAQUE IOHEXOL 350 MG/ML SOLN  Comparison:  Noninvasive lower extremity vascular evaluation - earlier same day; chest radiograph - 09/27/2012  Vascular Findings:  Evaluation of the abdominal vasculature is limited secondary to patient motion artifact.  Imaged thoracic aorta: Several tiny (2 mm) penetrated atherosclerotic ulcers are noted within distal aspect of the descending thoracic aorta (images 16, 17 and 20). No periaortic stranding.  Abdominal aorta:  There is scattered mixed slightly irregular mixed calcified and noncalcified atherosclerotic plaque within a normal caliber abdominal aorta.  No abdominal aortic dissection or periaortic stranding.  Celiac artery:  There is a mixture of  mixed calcified and noncalcified plaque involving the origin of the celiac artery not definitely resulting in hemodynamically significant stenosis.  SMA:  There is mixed calcified and noncalcified plaque involving the origin and proximal aspect of the SMA which likely approaches 40% luminal narrowing (images 42 and 43, series 5).  Right renal artery:  Duplicated codominant right-sided renal artery is are patent but diminutive throughout their course.  There is suspected narrowing of the origin of both right-sided renal arteries.  Left renal artery:  Solitary; diminutive throughout its course but patent.  IMA:  Patent.  -----------------------------------------------------------  Right Lower Extremity:  Pelvic vasculature:  There is scattered mixed calcified and  noncalcified plaque within the right common iliac artery, not resulting in hemodynamically significant stenosis.  The right internal iliac artery is mildly ectatic measuring approximately 7 mm in diameter proximally (image 88, series five).  The right external iliac artery is widely patent throughout its course.  Right common femoral artery:  There is a near occlusive filling defect throughout the entirety the common femoral artery. The vessel appears mildly expanded at this level suggestive of embolism.  Right deep femoral artery:  There is mixed occlusive or nonocclusive thrombus throughout the proximal aspect of the right deep femoral artery.  Right superficial femoral artery:  There is proximally occlusive and distally partially occlusive thrombus throughout the entirety of the right superficial femoral artery.  Right popliteal artery:  There is near occlusive thrombus throughout the entirety of the right above and below knee popliteal arteries.  Right lower leg:  The anterior tibial artery occludes shortly after its origin.  There is a two vessel runoff to the lower leg however both the posterior tibial and peroneal arteries appear to occlude above the  level of the malleoli.  A patent right-sided dorsalis pedis artery is not identified.  -------------------------------------------------------------  Left Lower Extremity:  Pelvic vasculature:  There is mixed calcified and noncalcified plaque involving the origin of the left external iliac artery resulting in approximately 70% luminal narrowing (axial image 73, series 5, coronal image 50, severe 8).  This finding is associated with mild post stenotic dilatation with the left external iliac artery measuring approximately 1.3 cm in greatest short axis diameter (image 76, series five).  The left internal iliac artery is diseased but patent and of normal caliber.  There is a minimal amount of eccentric atherosclerotic plaque with the distal aspect of the left external iliac artery, not resulting in hemodynamically significant stenosis.  Left common femoral artery:  Widely patent without hemodynamically significant narrowing.  Left deep femoral artery:  Widely patent.  Left superficial femoral artery:  There is a short segment severe (rate of 70%) narrowing of the distal aspect of the left superficial femoral artery at the level of the abductor canal (images 194 - 196). There is scattered eccentric mixed calcified and noncalcified plaque throughout the area of the proximal mid aspect of the left superficial femoral artery, not resulting in hemodynamically significant narrowing.  Left popliteal artery:  Evaluation degraded secondary to patient motion artifact.  There is scattered mixed eccentric calcified and noncalcified plaque primarily involving the above-knee popliteal artery, not definitely resulting in hemodynamically significant narrowing.  Left lower leg:  There is a minimal amount of eccentric calcified plaque involving the proximal aspect the left anterior tibial artery (image 273, series five).  There is a three-vessel runoff to the left foot.  A patent dorsalis pedis artery is identified to the level of the  hind foot.  Review of the MIP images confirms the above findings.  ------------------------------------------------------------------- -----  Nonvascular findings:  Evaluation of the abdominal organs is limited to the arterial phase of enhancement. Evaluation of the abdominal organs is degraded secondary to patient motion artifact.  Normal hepatic contour.  There is a peripheral nodular enhancing approximately 2.1 x 1.6 cm hemangioma within the left lobe of the liver (image 43, series five) as well as a similarly appearing approximately 2.3 x 2.2 cm hemangioma within the dome of the right lobe of the liver (coronal images 48 and 49, series 7). Normal appearance of the gallbladder.  No intra or extrahepatic biliary ductal dilatation.  No ascites.  There is a geographic area of apparent wedge shaped perfusion involving the posterior superior aspect of the left kidney (image 40).  Bilateral sub centimeter hypoattenuating lesions too small to accurate characterize of favored to represent renal cysts.  No definite renal stones on the post contrast examination.  No urinary obstruction or perinephric stranding.  Note is made of a sub centimeter (approximately 8 mm) nodule within the medial limb of the right adrenal gland.  Too small to accurately characterize.  Normal appearance of the left adrenal gland.  There is an approximately 1.2 x 1.5 cm cyst within the lower spleen (image 32, series five).  The pancreatic duct is dilated regional to the head of the pancreas measuring approximately 8 mm in diameter (image 50, series 5 though this finding is without discrete pancreatic atrophy given the patient age.  The bowel is normal in course and caliber without a discrete area of wall thickening or obstruction.  Normal appearance of the appendix.  No pneumoperitoneum, pneumatosis or portal venous gas. No definite retroperitoneal, mesenteric, pelvic or inguinal lymphadenopathy.  Normal appearance of the pelvic organs for age. No  free fluid within the pelvis.  Limited visualization of the lower thorax demonstrates bibasilar heterogeneous air space opacities, right greater than left.  Note is made of a small right-sided pleural effusion. There is a geographic approximately 1.9 x 1.3 cm area of decreased attenuation within the right lower lobe adjacent to the costophrenic angle (image 11, series 5, coronal image 38, series seven) which may represent a small area of developing pulmonary abscess.  Cardiomegaly, in particular, there is marked enlargement of the right side of the heart.  Coronary calcifications.  No pericardial effusion.  IMPRESSION:  1.  There is presumed embolism within the right common femoral artery extending to involve their entirety of the right superficial femoral and popliteal arteries as well as the proximal aspect of the right deep femoral artery resulting in multifocal occlusive and partially occlusive flow to the right lower extremity.  There is an atretic two-vessel runoff to the level of the malleoli via the posterior tibial and peroneal arteries.  2.  There is an approximately 2.7 x 2.2 cm geographic region of decreased attenuation within the posterior superior aspect of the left kidney.  Differential considerations include a focal area of pyelonephritis versus an underlying renal mass, though in current clinical setting, renal infarct is suspected.  3.  No definite evidence of embolism to the left lower extremity, though there is an approximately 70% narrowing involving the proximal aspect of the left common iliac artery with associated mild post stenotic dilatation as well as short segment severe narrowing of the distal aspect of the left superficial femoral artery at the level of the abductor canal.  Three-vessel runoff to the left foot.  The left-sided dorsalis pedis artery is identified to the level of the hind foot.  4.  Scattered irregular atherosclerotic plaque within a mildly tortuous but normal caliber  abdominal aorta.  No discrete filling defects within the abdominal aorta.  5.  Cardiomegaly, in particular, there is marked enlargement of the right side of the heart.  In the setting of presumed distal embolism, further evaluation with cardiac echo is recommended.  6.  Air space opacities within the imaged bilateral lower lobes worrisome for multifocal infection with possible approximately 1.9 cm area of relative decreased attenuation within the right lower lobe worrisome for a developing pulmonary parenchymal abscess and associated small right-sided parapneumonic pleural effusion.  7.  Nonspecific  pancreatic ductal dilatation, the etiology of which is not detected on this examination.  Further evaluation with non emergent MRCP may be obtained as clinically indicated.  Above findings discussed with Dr. Rubin Payor at 2018.   Original Report Authenticated By: Tacey Ruiz, MD   US Arterial Seg Single  09/29/2012   *RADIOLOGY REPORT*  Clinical Data:  Right leg pain and coldness, rest pain, history diabetes, hyperlipidemia, CHF, ITP, coronary artery disease  ULTRASOUND ANKLE/BRACHIAL INDICES BILATERAL  Comparison: 11/28/2004  Findings: Following pressures obtained, in mm Hg: Left brachial:          127 Left dorsalis pedis:          123 Left posterior tibial:  no flow detected Left ABI:               0.97  Right brachial:         118 Right dorsalis pedis:   no flow detected Right posterior tibial: no flow detected Right ABI:              N/A  Unable to obtain Doppler wave forms at the right superficial femoral or popliteal arteries as well.  IMPRESSION: Normal left ABI 0.97. No arterial flow could be detected within the right superficial femoral, popliteal, dorsalis pedis, or posterior tibial arteries. Findings either represent right lower extremity proximal arterial occlusion or high-grade stenosis with trickle flow which could not be detected.  Critical Value/emergent results were called by telephone at the time of  interpretation on 09/29/2012 at 1710 hours to Dr. Irene Limbo, who verbally acknowledged these results.   Original Report Authenticated By: Ulyses Southward, M.D.   Dg Chest Port 1 View  10/05/2012   *RADIOLOGY REPORT*  Clinical Data: Possible aspiration with cough for 3 days  PORTABLE CHEST - 1 VIEW  Comparison: Chest radiograph 09/27/2012 and multiple priors, including chest CT of July 2005.  Findings: Moderate cardiomegaly is stable.  There are extensive patchy airspace opacities throughout the right lung that are new. Additionally, there are bibasilar opacities.  High density seen within some of the right basilar lung may be related to remote aspiration, and is stable dating back to at least 2005.  Small bilateral pleural effusions are present, right greater than left. Right paratracheal soft tissue is chronic and may be due to the thyroid gland enlargement.  No acute osseous abnormality identified.  To surgical clips project over the left upper quadrant.  IMPRESSION:  1.  Patchy asymmetric airspace opacities throughout the right lung are suspicious for aspiration or pneumonia. 2.  Left basilar opacity could be due to atelectasis or airspace disease related to aspiration pneumonia. 3.  Small bilateral pleural effusions. 4.  Chronic high density material within some of the right basilar airways (dating back to at least 2005) likely reflects prior aspiration of high density material.   Original Report Authenticated By: Britta Mccreedy, M.D.   Dg Chest Portable 1 View  09/27/2012   *RADIOLOGY REPORT*  Clinical Data: Respiratory distress  PORTABLE CHEST - 1 VIEW  Comparison: Portable chest x-ray of 09/24/2046  Findings: Opacities overlie the left mid upper chest many of which most likely artifactual.  However due to these overlapping opacities, pneumonia cannot be excluded.  The right lung is clear. Cardiomegaly is stable.  Opacities in the right lung base appear stable and may represent aspirated material, stable since  CT from 2005.  IMPRESSION:  1.  Stable cardiomegaly. 2.  Artifacts overlie the left chest.  No definite pneumonia, but the  left mid and upper lung is difficult to assess due to the overlapping artifactual opacities.   Original Report Authenticated By: Dwyane Dee, M.D.   Dg Chest Portable 1 View  09/23/2012   *RADIOLOGY REPORT*  Clinical Data: Shortness of breath.  PORTABLE CHEST - 1 VIEW  Comparison: Chest x-ray 09/16/2012.  Findings: Lung volumes are low.  There are bibasilar opacities, favored to represent subsegmental atelectasis.  Possible trace right pleural effusion.  Pulmonary venous congestion, without frank pulmonary edema.  Mild cardiomegaly. The patient is rotated to the right on today's exam, resulting in distortion of the mediastinal contours and reduced diagnostic sensitivity and specificity for mediastinal pathology.  Atherosclerosis in the thoracic aorta. Right-sided calcified pleural plaques again noted.  IMPRESSION: 1.  Low lung volumes with probable bibasilar subsegmental atelectasis and small right-sided pleural effusion. 2.  Cardiomegaly with pulmonary venous congestion, but no frank pulmonary edema at this time. 3.  Atherosclerosis.   Original Report Authenticated By: Trudie Reed, M.D.    Micro Results     CBC w Diff: Lab Results  Component Value Date   WBC 20.7* 10/05/2012   HGB 11.6* 10/05/2012   HCT 35.1* 10/05/2012   PLT 108* 10/05/2012   LYMPHOPCT 6* 09/27/2012   MONOPCT 9 09/27/2012   EOSPCT 0 09/27/2012   BASOPCT 0 09/27/2012    CMP: Lab Results  Component Value Date   NA 140 10/06/2012   K 4.4 10/06/2012   CL 104 10/06/2012   CO2 28 10/06/2012   BUN 26* 10/06/2012   CREATININE 0.98 10/06/2012   PROT 6.3 09/27/2012   ALBUMIN 2.8* 09/27/2012   BILITOT 0.7 09/27/2012   ALKPHOS 57 09/27/2012   AST 17 09/27/2012   ALT 32 09/27/2012  .   Discharge Instructions     Follow with Primary MD Cassell Smiles., MD in 4 days   Get CBC, CMP, INR checked 4 days by Primary MD  and again as instructed by your Primary MD. Get a 2 view Chest X ray done next visit if you had Pneumonia of Lung problems at the Hospital.  Get Medicines reviewed and adjusted.  Please request your Prim.MD to go over all Hospital Tests and Procedure/Radiological results at the follow up, please get all Hospital records sent to your Prim MD by signing hospital release before you go home.  Activity: As tolerated with Full fall precautions use walker/cane & assistance as needed   Diet: Pured diet with thin liquids, full assistance and aspiration precautions, keep head of the bed at 90 while feeding. Patient is extremely high risk for aspiration, family tends to feed the patient when she is awake, continue to advise not to do so.  For Heart failure patients - Check your Weight same time everyday, if you gain over 2 pounds, or you develop in leg swelling, experience more shortness of breath or chest pain, call your Primary MD immediately. Follow Cardiac Low Salt Diet and 1.8 lit/day fluid restriction.  Disposition SNF with palliative care and hospice  If you experience worsening of your admission symptoms, develop shortness of breath, life threatening emergency, suicidal or homicidal thoughts you must seek medical attention immediately by calling 911 or calling your MD immediately  if symptoms less severe.    Follow-up Information   Follow up with Cassell Smiles., MD. Schedule an appointment as soon as possible for a visit in 4 days.   Contact information:   [address] [place] [address] [phone]  Discharge Medications     Medication List    STOP taking these medications       diltiazem 120 MG 24 hr capsule  Commonly known as:  CARDIZEM CD     ibuprofen 200 MG tablet  Commonly known as:  ADVIL,MOTRIN     lisinopril 10 MG tablet  Commonly known as:  PRINIVIL,ZESTRIL      TAKE these medications       albuterol-ipratropium 18-103  MCG/ACT inhaler  Commonly known as:  COMBIVENT  Inhale 2 puffs into the lungs every 6 (six) hours as needed for wheezing.     amoxicillin-clavulanate 500-125 MG per tablet  Commonly known as:  AUGMENTIN  Take 1 tablet (500 mg total) by mouth 3 (three) times daily. 5 more days     aspirin 81 MG tablet  Take 81 mg by mouth every morning.     carvedilol 3.125 MG tablet  Commonly known as:  COREG  Take 1 tablet (3.125 mg total) by mouth 2 (two) times daily with a meal.     enoxaparin 40 MG/0.4ML injection  Commonly known as:  LOVENOX  Inject 0.4 mLs (40 mg total) into the skin daily. Stop once the INR is 2     guaiFENesin 600 MG 12 hr tablet  Commonly known as:  MUCINEX  Take 600 mg by mouth every morning.     HYDROcodone-acetaminophen 5-325 MG per tablet  Commonly known as:  NORCO/VICODIN  Take 1 tablet by mouth every 4 (four) hours as needed.     isosorbide mononitrate 60 MG 24 hr tablet  Commonly known as:  IMDUR  Take 60 mg by mouth every morning.     Multivitamin/Iron Tabs  Take 1 tablet by mouth every morning.     nitroGLYCERIN 0.4 MG SL tablet  Commonly known as:  NITROSTAT  Place 0.4 mg under the tongue every 5 (five) minutes as needed.     pantoprazole 40 MG tablet  Commonly known as:  PROTONIX  Take 40 mg by mouth every morning.     potassium chloride SA 20 MEQ tablet  Commonly known as:  K-DUR,KLOR-CON  Take 20 mEq by mouth every morning.     simvastatin 20 MG tablet  Commonly known as:  ZOCOR  Take 20 mg by mouth at bedtime.     solifenacin 5 MG tablet  Commonly known as:  VESICARE  Take 5 mg by mouth every morning.     Warfarin - Pharmacist Dosing Inpatient Misc  Coumadin dosing per year pharmacist, this is for right leg arterial clot. Monitor INR closely goal of INR is 2-3. Stop Lovenox once INR reaches 2.           Total Time in preparing paper work, data evaluation and todays exam - 35 minutes  Leroy Sea M.D on 10/06/2012 at 11:00  AM  [facility] Office  [phone]

## 2012-10-06 NOTE — Progress Notes (Signed)
ANTICOAGULATION CONSULT NOTE  Pharmacy Consult for Heparin>>lovenox/coumadin Indication: RLE DVT  No Known Allergies  Patient Measurements: Height: 5' (152.4 cm) Weight: 86 lb 6.7 oz (39.2 kg) IBW/kg (Calculated) : 45.5  Vital Signs: Temp: 97.8 F (36.6 C) (06/19 0533) Temp src: Oral (06/19 0533) BP: 97/61 mmHg (06/19 0533) Pulse Rate: 87 (06/19 0533)  Labs:  Recent Labs  10/04/12 0555 10/04/12 1732 10/05/12 0505 10/05/12 1030 10/06/12 0510  HGB 11.7*  --  11.6*  --   --   HCT 35.9*  --  35.1*  --   --   PLT 91*  --  108*  --   --   LABPROT  --   --   --  14.1 15.1  INR  --   --   --  1.10 1.21  HEPARINUNFRC 0.19* 0.19* 0.18*  --   --   CREATININE  --   --  1.13*  --  0.98    Estimated Creatinine Clearance: 24.1 ml/min (by C-G formula based on Cr of 0.98).  Assessment: 79 yo Female with R leg embolism. Patient was started on IV heparin and now will transition to lovenox/warfarin with hopes of discharging to facility.  Lovenox has been dose adjusted based on body weight and renal function. INR is now trending up to 1.2 this morning. Noted aspiration and addition of unasyn, which should have little impact on warfarin metabolism, augmentin would also have only minimal interaction(decreases natural vit k producing bacteria in gut). I am concerned that her low and lack of po intake may make her very sensitive to the warfarin therapy, will continue to conservatively dose.   Goal of Therapy:  INR 2-3 Anti-Xa level 0.6-1.2 units/ml 4hrs after LMWH dose given Monitor platelets by anticoagulation protocol: Yes   Plan:  1.Continue lovenox 40mg  q24 for DVT treatment -d/c once INR>2 2.Warfarin 2mg  daily with INR daily while here  Sheppard Coil PharmD., BCPS Clinical Pharmacist Pager [phone] 10/06/2012 10:51 AM

## 2012-10-06 NOTE — Progress Notes (Signed)
Palliative Medicine Team SW Psychosocial follow up, no family present. Pt sleeping, did not arouse to verbal or physical stimuli. Note issues with d/c planning, to be managed by unit CSW. Available as needed for additional family support.   Kennieth Francois, LCSWA PMT phone [phone] Pager [phone]

## 2012-10-06 NOTE — Progress Notes (Signed)
Hypoglycemic Event  CBG: 42  Treatment: D50 IV 50 mL  Symptoms: None  Follow-up CBG: Time:1156 CBG Result:111  Possible Reasons for Event: Inadequate meal intake  Comments/MD notified:  Pt NPO awaiting swallow eval.  MD has been made aware.      [name]  Remember to initiate Hypoglycemia Order Set & complete

## 2012-10-06 NOTE — Evaluation (Signed)
Clinical/Bedside Swallow Evaluation Patient Details  Name: [name] MRN: [id number] Date of Birth: [birth year]  Today's Date: 10/06/2012 Time: 0945-1000 SLP Time Calculation (min): 15 min  Past Medical History:  Past Medical History  Diagnosis Date  . Coronary artery disease   . Ventricular tachycardia   . CHF (congestive heart failure)   . GERD (gastroesophageal reflux disease)   . Atrial flutter   . Sick sinus syndrome   . Dementia   . Diabetes mellitus   . Thrombocytopenia   . Right bundle branch block   . Hyperlipidemia   . TIA (transient ischemic attack)   . Arthritis   . ITP (idiopathic thrombocytopenic purpura) 07/14/2011    Chronic low grade idiopathic thrombocytopenia purpura still not in need of therapy.   Marland Kitchen URI (upper respiratory infection)     history  . Hx: UTI (urinary tract infection)   . Chronic kidney disease   . Pneumonia    Past Surgical History:  Past Surgical History  Procedure Laterality Date  . Coronary angioplasty with stent placement    . Biopsy thyroid     HPI:  79 y.o. female with multiple medical problems including CHF, atrial flutter,mild dementia and diabetes who was initially admitted to [facility] with chief complaint shortness of breath with desaturation per EMS. In ED there patient had new onset atrial flutter in the setting of chronic cardiac arhythmia. While at [facility] on 6/12 she reported increased R knee pain. The patient has a long standing history of right knee pain by report from family. Acute exacerbation of the pain occurred 6/12 afternoon with marked change in exam ~3:pm. History could not be obtained from the patient due to altered mental status. Per family the patient has a history of venous stasis, unclear if just right leg or left leg. The patient has not be ambulatory for years and is wheelchair bound at home, lived at home with her family.  Pt was transferred to [facility] on 6/12 for Vascular surgery consultation>> seen by Dr Imogene Burn and surgery  discussed for acute arteial embolism to RLE but family decided on nosurgical management. Palliative care was consulted>> followed and pt made limited code, family/POA wants all meds including vasopressors>> pt developed hypotesion and CCM was consulted and spoke with family>>limited IVF given, started on abx and recommended not starting vasopressors. Pt has been eating purees/thin liquids with careful handfeeding provided by family (comfort feeds.) Yesterday, 6/18, CSR revealed patchy asymmetric airspace opacities throughout the right lung, suggestive of PNA.  Swallow eval ordered.     Assessment / Plan / Recommendation Clinical Impression  Pt alert, responsive when cued, low volume phonation, demonstrated functional swallow with thin liquids and purees.  There was occasional (< 20% of trials) throat-clearing after consumption of purees, and pt required cues to sustain attention to POs.  Frailty, deconditioning, and baseline dementia increase likelihood of intermittent aspiration.  Risk can be minimized with careful hand-feeding in upright position, providing small boluses at a slow rate.  Even given careful measures, aspiration can not be eliminated.  Recommend continue purees, thin liquids with hand-feeding, consistent with decision for comfort POs, given known aspiration risks.  No family present during assessment.     Aspiration Risk  Moderate    Diet Recommendation Dysphagia 1 (Puree);Thin liquid   Liquid Administration via: Cup;Spoon Medication Administration: Crushed with puree Supervision: Trained caregiver to feed patient Compensations: Slow rate;Small sips/bites;Check for pocketing;Follow solids with liquid Postural Changes and/or Swallow Maneuvers: Out of bed for meals;Seated upright  90 degrees;Upright 30-60 min after meal    Other  Recommendations Oral Care Recommendations: Oral care BID   Follow Up Recommendations  None           SLP Swallow Goals     Swallow Study Prior  Functional Status       General Date of Onset: 09/17/12 HPI: 79 y.o. female with multiple medical problems including CHF, atrial flutter,mild dementia and diabetes who was initially admitted to [facility] with chief complaint shortness of breath with desaturation per EMS. In ED there patient had new onset atrial flutter in the setting of chronic cardiac arhythmia. While at [facility] on 6/12 she reported increased R knee pain. The patient has a long standing history of right knee pain by report from family. Acute exacerbation of the pain occurred 6/12 afternoon with marked change in exam ~3:pm. History could not be obtained from the patient due to altered mental status. Per family the patient has a history of venous stasis, unclear if just right leg or left leg. The patient has not be ambulatory for years and is wheelchair bound at home, lived at home with her family.  Pt was transferred to [facility] on 6/12 for Vascular surgery consultation>> seen by Dr Imogene Burn and surgery discussed for acute arteial embolism to RLE but family decided on nosurgical management. Palliative care was consulted>> followed and pt made limited code, family/POA wants all meds including vasopressors>> pt developed hypotesion and CCM was consulted and spoke with family>>limited IVF given, started on abx and recommended not starting vasopressors. Pt has been eating purees/thin liquids with careful handfeeding provided by family (comfort feeds.) Yesterday, 6/18, CSR revealed patchy asymmetric airspace opacities throughout the right lung, suggestive of PNA.  Swallow eval ordered.   Type of Study: Bedside swallow evaluation Previous Swallow Assessment: 09/19/12 clinical swallow eval at [facility] revealed slowed mastication but protective swallow mechanism with mild aspiration risk Diet Prior to this Study: NPO Temperature Spikes Noted: No Respiratory Status: Supplemental O2 delivered via (comment) Behavior/Cognition: Alert;Confused Oral Cavity - Dentition:  Edentulous Self-Feeding Abilities: Total assist Patient Positioning: Upright in bed Baseline Vocal Quality: Clear;Low vocal intensity Volitional Cough: Weak Volitional Swallow: Unable to elicit    Oral/Motor/Sensory Function Overall Oral Motor/Sensory Function: Impaired Lingual Strength: Reduced Mandible: Impaired   Ice Chips Ice chips: Not tested   Thin Liquid Thin Liquid: Impaired Presentation: Cup;Spoon Pharyngeal  Phase Impairments: Multiple swallows    Nectar Thick Nectar Thick Liquid: Not tested   Honey Thick Honey Thick Liquid: Not tested   Puree Puree: Impaired Oral Phase Functional Implications: Prolonged oral transit Pharyngeal Phase Impairments: Multiple swallows;Throat Clearing - Immediate   Solid   GO    Solid: Not tested       [name] 10/06/2012,10:19 AM

## 2012-10-06 NOTE — Progress Notes (Signed)
IV d/c'd/  Tele d/c'd.  Pt d/c'd to SNF.  Home meds and d/c instructions have been discussed with pt and pt family.  Both deny any questions or concerns at this time.  Report has been called to receiving nurse at facility.  Nurse denies an question or concerns as well.  Pt leaving unit via ambulance transport and appears in no acute distress. Nino Glow RN

## 2012-10-13 ENCOUNTER — Encounter: Payer: Self-pay | Admitting: Cardiovascular Disease

## 2012-10-13 DIAGNOSIS — I10 Essential (primary) hypertension: Secondary | ICD-10-CM | POA: Insufficient documentation

## 2012-10-13 DIAGNOSIS — R600 Localized edema: Secondary | ICD-10-CM | POA: Insufficient documentation

## 2012-10-13 NOTE — Progress Notes (Signed)
Patient ID: [name], female   DOB: 25-Jul-1921, 79 y.o.   MRN: [id number]     HPI: [name], is a 79 y.o. female who has a history of coronary artery disease and is status post stenting of her LAD with a 2.5x20 mm Taxus drug-eluting stent in 2005 she also has junctional rhythm history with sick sinus syndrome. She has a history of progressive lower extremity edema. He has atrial flutter but has not been on Coumadin anticoagulation due to fall risk. Has seen her last in the office in January 2014. He tells me she had been hospitalized recently with dehydration initially, but then apparently had some congestive issues. She denies any anginal symptoms. She denies chest tightness.   Past Medical History  Diagnosis Date  . Coronary artery disease   . Ventricular tachycardia   . CHF (congestive heart failure)   . GERD (gastroesophageal reflux disease)   . Atrial flutter   . Sick sinus syndrome   . Dementia   . Diabetes mellitus   . Thrombocytopenia   . Right bundle branch block   . Hyperlipidemia   . TIA (transient ischemic attack)   . Arthritis   . ITP (idiopathic thrombocytopenic purpura) 07/14/2011    Chronic low grade idiopathic thrombocytopenia purpura still not in need of therapy.   Marland Kitchen URI (upper respiratory infection)     history  . Hx: UTI (urinary tract infection)   . Chronic kidney disease   . Pneumonia     Past Surgical History  Procedure Laterality Date  . Coronary angioplasty with stent placement    . Biopsy thyroid      No Known Allergies  Current Outpatient Prescriptions  Medication Sig Dispense Refill  . aspirin 81 MG tablet Take 81 mg by mouth every morning.       Marland Kitchen guaiFENesin (MUCINEX) 600 MG 12 hr tablet Take 600 mg by mouth every morning.        . isosorbide mononitrate (IMDUR) 60 MG 24 hr tablet Take 60 mg by mouth every morning.       . Multiple Vitamins-Iron (MULTIVITAMIN/IRON) TABS Take 1 tablet by mouth every morning.        . nitroGLYCERIN  (NITROSTAT) 0.4 MG SL tablet Place 0.4 mg under the tongue every 5 (five) minutes as needed.      . pantoprazole (PROTONIX) 40 MG tablet Take 40 mg by mouth every morning.       . potassium chloride SA (K-DUR,KLOR-CON) 20 MEQ tablet Take 20 mEq by mouth every morning.       . simvastatin (ZOCOR) 20 MG tablet Take 20 mg by mouth at bedtime.       . solifenacin (VESICARE) 5 MG tablet Take 5 mg by mouth every morning.       Marland Kitchen albuterol-ipratropium (COMBIVENT) 18-103 MCG/ACT inhaler Inhale 2 puffs into the lungs every 6 (six) hours as needed for wheezing.      Marland Kitchen amoxicillin-clavulanate (AUGMENTIN) 500-125 MG per tablet Take 1 tablet (500 mg total) by mouth 3 (three) times daily. 5 more days      . carvedilol (COREG) 3.125 MG tablet Take 1 tablet (3.125 mg total) by mouth 2 (two) times daily with a meal.      . enoxaparin (LOVENOX) 40 MG/0.4ML injection Inject 0.4 mLs (40 mg total) into the skin daily. Stop once the INR is 2  0 Syringe    . HYDROcodone-acetaminophen (NORCO/VICODIN) 5-325 MG per tablet Take 1 tablet by mouth every  4 (four) hours as needed.  30 tablet  0  . warfarin (COUMADIN) 2 MG tablet Take 1 tablet (2 mg total) by mouth daily at 6 PM.  30 tablet  1  . [DISCONTINUED] fexofenadine (ALLEGRA) 60 MG tablet Take 180 mg by mouth daily as needed. allergies        No current facility-administered medications for this visit.    Socially she is widowed and has 6 children. There is no tobacco or alcohol history.  ROS is negative for fevers, chills or night sweats. She does note a rare palpitation. She does note leg swelling. She denies chest pressure. She denies syncope. She denies bleeding. She does note leg swelling bilaterally. She is unaware of tachycardia dysrhythmia.  Other system review is negative.  PE BP 100/56  Pulse 88  Ht 5' (1.524 m)  SpO2 94%  General: Alert, oriented, no distress.  Skin: normal turgor, no rashes HEENT: Normocephalic, atraumatic. Pupils round and reactive;  sclera anicteric;no lid lag.  Nose without nasal septal hypertrophy Mouth/Parynx benign; Mallinpatti scale** Neck: No JVD, no carotid briuts Lungs: clear to ausculatation and percussion; no wheezing or rales Heart: RRR, s1 s2 normal  Abdomen: soft, nontender; no hepatosplenomehaly, BS+; abdominal aorta nontender and not dilated by palpation. Pulses 2+ Extremities: no clubbing cyanosis or edema, Homan's sign negative  Neurologic: grossly nonfocal  ECG: Atrial flutter with variable AV block, right bundle branch block LVH with repolarization.  LABS:  BMET    Component Value Date/Time   NA 140 10/06/2012 0510   K 4.4 10/06/2012 0510   CL 104 10/06/2012 0510   CO2 28 10/06/2012 0510   GLUCOSE 141* 10/06/2012 0510   BUN 26* 10/06/2012 0510   CREATININE 0.98 10/06/2012 0510   CALCIUM 9.4 10/06/2012 0510   GFRNONAA 50* 10/06/2012 0510   GFRAA 58* 10/06/2012 0510     Hepatic Function Panel     Component Value Date/Time   PROT 6.3 09/27/2012 1522   ALBUMIN 2.8* 09/27/2012 1522   AST 17 09/27/2012 1522   ALT 32 09/27/2012 1522   ALKPHOS 57 09/27/2012 1522   BILITOT 0.7 09/27/2012 1522     CBC    Component Value Date/Time   WBC 23.6* 10/06/2012 1135   RBC 3.89 10/06/2012 1135   HGB 11.5* 10/06/2012 1135   HCT 33.9* 10/06/2012 1135   PLT 111* 10/06/2012 1135   MCV 87.1 10/06/2012 1135   MCH 29.6 10/06/2012 1135   MCHC 33.9 10/06/2012 1135   RDW 15.6* 10/06/2012 1135   LYMPHSABS 0.6* 09/27/2012 1522   MONOABS 0.8 09/27/2012 1522   EOSABS 0.0 09/27/2012 1522   BASOSABS 0.0 09/27/2012 1522     BNP    Component Value Date/Time   PROBNP 3230.0* 09/27/2012 1522    Lipid Panel     Component Value Date/Time   CHOL 116 09/16/2012 0519   TRIG 57 09/16/2012 0519   HDL 59 09/16/2012 0519   CHOLHDL 2.0 09/16/2012 0519   VLDL 11 09/16/2012 0519   LDLCALC 46 09/16/2012 0519     RADIOLOGY: Dg Chest 1 View  09/16/2012   *RADIOLOGY REPORT*  Clinical Data: Status post fall; concern for chest injury.   CHEST - 1 VIEW  Comparison: Chest radiograph performed 07/04/2012  Findings: The lungs are well-aerated.  Pulmonary vascularity is at the upper limits of normal.  Mild basilar atelectasis is noted. There is no evidence of pleural effusion or pneumothorax.  The cardiomediastinal silhouette is enlarged; calcification is noted in the  aortic arch.  No acute osseous abnormalities are seen.  IMPRESSION: Mild bibasilar atelectasis noted; lungs otherwise clear. Cardiomegaly noted.  No displaced rib fractures seen.   Original Report Authenticated By: Tonia Ghent, M.D.   Dg Knee 1-2 Views Right  09/16/2012   *RADIOLOGY REPORT*  Clinical Data: Status post fall; right knee pain.  RIGHT KNEE - 1-2 VIEW  Comparison: Right knee radiographs performed 10/05/2011  Findings: There is no evidence of fracture or dislocation.  The joint spaces are preserved.  No significant degenerative change is seen; the patellofemoral joint is grossly unremarkable in appearance.  An enthesophyte is noted at the superior pole of the patella.  A small knee joint effusion is seen.  Scattered vascular calcifications are seen.  The visualized soft tissues are otherwise unremarkable in appearance.  IMPRESSION:  1. No evidence of fracture or dislocation. 2.  Small knee joint effusion noted. 3.  Scattered vascular calcifications seen.   Original Report Authenticated By: Tonia Ghent, M.D.   Ct Angio Ao+bifem W/cm &/or Wo/cm  09/29/2012   *RADIOLOGY REPORT*  Clinical Data:  Right leg pain, suspected ischemia  CT ANGIOGRAPHY OF ABDOMINAL AORTA WITH ILIOFEMORAL RUNOFF  Technique:  Multidetector CT imaging of the abdomen, pelvis and lower extremities was performed using the standard protocol during bolus administration of intravenous contrast.  Multiplanar CT image reconstructions including MIPs were obtained to evaluate the vascular anatomy.  Contrast: OMNIPAQUE IOHEXOL 350 MG/ML SOLN  Comparison:  Noninvasive lower extremity vascular evaluation -  earlier same day; chest radiograph - 09/27/2012  Vascular Findings:  Evaluation of the abdominal vasculature is limited secondary to patient motion artifact.  Imaged thoracic aorta: Several tiny (2 mm) penetrated atherosclerotic ulcers are noted within distal aspect of the descending thoracic aorta (images 16, 17 and 20). No periaortic stranding.  Abdominal aorta:  There is scattered mixed slightly irregular mixed calcified and noncalcified atherosclerotic plaque within a normal caliber abdominal aorta.  No abdominal aortic dissection or periaortic stranding.  Celiac artery:  There is a mixture of mixed calcified and noncalcified plaque involving the origin of the celiac artery not definitely resulting in hemodynamically significant stenosis.  SMA:  There is mixed calcified and noncalcified plaque involving the origin and proximal aspect of the SMA which likely approaches 40% luminal narrowing (images 42 and 43, series 5).  Right renal artery:  Duplicated codominant right-sided renal artery is are patent but diminutive throughout their course.  There is suspected narrowing of the origin of both right-sided renal arteries.  Left renal artery:  Solitary; diminutive throughout its course but patent.  IMA:  Patent.  -----------------------------------------------------------  Right Lower Extremity:  Pelvic vasculature:  There is scattered mixed calcified and noncalcified plaque within the right common iliac artery, not resulting in hemodynamically significant stenosis.  The right internal iliac artery is mildly ectatic measuring approximately 7 mm in diameter proximally (image 88, series five).  The right external iliac artery is widely patent throughout its course.  Right common femoral artery:  There is a near occlusive filling defect throughout the entirety the common femoral artery. The vessel appears mildly expanded at this level suggestive of embolism.  Right deep femoral artery:  There is mixed occlusive or  nonocclusive thrombus throughout the proximal aspect of the right deep femoral artery.  Right superficial femoral artery:  There is proximally occlusive and distally partially occlusive thrombus throughout the entirety of the right superficial femoral artery.  Right popliteal artery:  There is near occlusive thrombus throughout the entirety  of the right above and below knee popliteal arteries.  Right lower leg:  The anterior tibial artery occludes shortly after its origin.  There is a two vessel runoff to the lower leg however both the posterior tibial and peroneal arteries appear to occlude above the level of the malleoli.  A patent right-sided dorsalis pedis artery is not identified.  -------------------------------------------------------------  Left Lower Extremity:  Pelvic vasculature:  There is mixed calcified and noncalcified plaque involving the origin of the left external iliac artery resulting in approximately 70% luminal narrowing (axial image 73, series 5, coronal image 50, severe 8).  This finding is associated with mild post stenotic dilatation with the left external iliac artery measuring approximately 1.3 cm in greatest short axis diameter (image 76, series five).  The left internal iliac artery is diseased but patent and of normal caliber.  There is a minimal amount of eccentric atherosclerotic plaque with the distal aspect of the left external iliac artery, not resulting in hemodynamically significant stenosis.  Left common femoral artery:  Widely patent without hemodynamically significant narrowing.  Left deep femoral artery:  Widely patent.  Left superficial femoral artery:  There is a short segment severe (rate of 70%) narrowing of the distal aspect of the left superficial femoral artery at the level of the abductor canal (images 194 - 196). There is scattered eccentric mixed calcified and noncalcified plaque throughout the area of the proximal mid aspect of the left superficial femoral artery,  not resulting in hemodynamically significant narrowing.  Left popliteal artery:  Evaluation degraded secondary to patient motion artifact.  There is scattered mixed eccentric calcified and noncalcified plaque primarily involving the above-knee popliteal artery, not definitely resulting in hemodynamically significant narrowing.  Left lower leg:  There is a minimal amount of eccentric calcified plaque involving the proximal aspect the left anterior tibial artery (image 273, series five).  There is a three-vessel runoff to the left foot.  A patent dorsalis pedis artery is identified to the level of the hind foot.  Review of the MIP images confirms the above findings.  ------------------------------------------------------------------- -----  Nonvascular findings:  Evaluation of the abdominal organs is limited to the arterial phase of enhancement. Evaluation of the abdominal organs is degraded secondary to patient motion artifact.  Normal hepatic contour.  There is a peripheral nodular enhancing approximately 2.1 x 1.6 cm hemangioma within the left lobe of the liver (image 43, series five) as well as a similarly appearing approximately 2.3 x 2.2 cm hemangioma within the dome of the right lobe of the liver (coronal images 48 and 49, series 7). Normal appearance of the gallbladder.  No intra or extrahepatic biliary ductal dilatation.  No ascites.  There is a geographic area of apparent wedge shaped perfusion involving the posterior superior aspect of the left kidney (image 40).  Bilateral sub centimeter hypoattenuating lesions too small to accurate characterize of favored to represent renal cysts.  No definite renal stones on the post contrast examination.  No urinary obstruction or perinephric stranding.  Note is made of a sub centimeter (approximately 8 mm) nodule within the medial limb of the right adrenal gland.  Too small to accurately characterize.  Normal appearance of the left adrenal gland.  There is an  approximately 1.2 x 1.5 cm cyst within the lower spleen (image 32, series five).  The pancreatic duct is dilated regional to the head of the pancreas measuring approximately 8 mm in diameter (image 50, series 5 though this finding is without discrete  pancreatic atrophy given the patient age.  The bowel is normal in course and caliber without a discrete area of wall thickening or obstruction.  Normal appearance of the appendix.  No pneumoperitoneum, pneumatosis or portal venous gas. No definite retroperitoneal, mesenteric, pelvic or inguinal lymphadenopathy.  Normal appearance of the pelvic organs for age. No free fluid within the pelvis.  Limited visualization of the lower thorax demonstrates bibasilar heterogeneous air space opacities, right greater than left.  Note is made of a small right-sided pleural effusion. There is a geographic approximately 1.9 x 1.3 cm area of decreased attenuation within the right lower lobe adjacent to the costophrenic angle (image 11, series 5, coronal image 38, series seven) which may represent a small area of developing pulmonary abscess.  Cardiomegaly, in particular, there is marked enlargement of the right side of the heart.  Coronary calcifications.  No pericardial effusion.  IMPRESSION:  1.  There is presumed embolism within the right common femoral artery extending to involve their entirety of the right superficial femoral and popliteal arteries as well as the proximal aspect of the right deep femoral artery resulting in multifocal occlusive and partially occlusive flow to the right lower extremity.  There is an atretic two-vessel runoff to the level of the malleoli via the posterior tibial and peroneal arteries.  2.  There is an approximately 2.7 x 2.2 cm geographic region of decreased attenuation within the posterior superior aspect of the left kidney.  Differential considerations include a focal area of pyelonephritis versus an underlying renal mass, though in current  clinical setting, renal infarct is suspected.  3.  No definite evidence of embolism to the left lower extremity, though there is an approximately 70% narrowing involving the proximal aspect of the left common iliac artery with associated mild post stenotic dilatation as well as short segment severe narrowing of the distal aspect of the left superficial femoral artery at the level of the abductor canal.  Three-vessel runoff to the left foot.  The left-sided dorsalis pedis artery is identified to the level of the hind foot.  4.  Scattered irregular atherosclerotic plaque within a mildly tortuous but normal caliber abdominal aorta.  No discrete filling defects within the abdominal aorta.  5.  Cardiomegaly, in particular, there is marked enlargement of the right side of the heart.  In the setting of presumed distal embolism, further evaluation with cardiac echo is recommended.  6.  Air space opacities within the imaged bilateral lower lobes worrisome for multifocal infection with possible approximately 1.9 cm area of relative decreased attenuation within the right lower lobe worrisome for a developing pulmonary parenchymal abscess and associated small right-sided parapneumonic pleural effusion.  7.  Nonspecific pancreatic ductal dilatation, the etiology of which is not detected on this examination.  Further evaluation with non emergent MRCP may be obtained as clinically indicated.  Above findings discussed with Dr. Rubin Payor at 2018.   Original Report Authenticated By: Tacey Ruiz, MD   US Arterial Seg Single  09/29/2012   *RADIOLOGY REPORT*  Clinical Data:  Right leg pain and coldness, rest pain, history diabetes, hyperlipidemia, CHF, ITP, coronary artery disease  ULTRASOUND ANKLE/BRACHIAL INDICES BILATERAL  Comparison: 11/28/2004  Findings: Following pressures obtained, in mm Hg: Left brachial:          127 Left dorsalis pedis:          123 Left posterior tibial:  no flow detected Left ABI:  0.97   Right brachial:         118 Right dorsalis pedis:   no flow detected Right posterior tibial: no flow detected Right ABI:              N/A  Unable to obtain Doppler wave forms at the right superficial femoral or popliteal arteries as well.  IMPRESSION: Normal left ABI 0.97. No arterial flow could be detected within the right superficial femoral, popliteal, dorsalis pedis, or posterior tibial arteries. Findings either represent right lower extremity proximal arterial occlusion or high-grade stenosis with trickle flow which could not be detected.  Critical Value/emergent results were called by telephone at the time of interpretation on 09/29/2012 at 1710 hours to Dr. Irene Limbo, who verbally acknowledged these results.   Original Report Authenticated By: Ulyses Southward, M.D.   Dg Chest Port 1 View  10/05/2012   *RADIOLOGY REPORT*  Clinical Data: Possible aspiration with cough for 3 days  PORTABLE CHEST - 1 VIEW  Comparison: Chest radiograph 09/27/2012 and multiple priors, including chest CT of July 2005.  Findings: Moderate cardiomegaly is stable.  There are extensive patchy airspace opacities throughout the right lung that are new. Additionally, there are bibasilar opacities.  High density seen within some of the right basilar lung may be related to remote aspiration, and is stable dating back to at least 2005.  Small bilateral pleural effusions are present, right greater than left. Right paratracheal soft tissue is chronic and may be due to the thyroid gland enlargement.  No acute osseous abnormality identified.  To surgical clips project over the left upper quadrant.  IMPRESSION:  1.  Patchy asymmetric airspace opacities throughout the right lung are suspicious for aspiration or pneumonia. 2.  Left basilar opacity could be due to atelectasis or airspace disease related to aspiration pneumonia. 3.  Small bilateral pleural effusions. 4.  Chronic high density material within some of the right basilar airways (dating back  to at least 2005) likely reflects prior aspiration of high density material.   Original Report Authenticated By: Britta Mccreedy, M.D.   Dg Chest Portable 1 View  09/27/2012   *RADIOLOGY REPORT*  Clinical Data: Respiratory distress  PORTABLE CHEST - 1 VIEW  Comparison: Portable chest x-ray of 09/24/2046  Findings: Opacities overlie the left mid upper chest many of which most likely artifactual.  However due to these overlapping opacities, pneumonia cannot be excluded.  The right lung is clear. Cardiomegaly is stable.  Opacities in the right lung base appear stable and may represent aspirated material, stable since CT from 2005.  IMPRESSION:  1.  Stable cardiomegaly. 2.  Artifacts overlie the left chest.  No definite pneumonia, but the left mid and upper lung is difficult to assess due to the overlapping artifactual opacities.   Original Report Authenticated By: Dwyane Dee, M.D.   Dg Chest Portable 1 View  09/23/2012   *RADIOLOGY REPORT*  Clinical Data: Shortness of breath.  PORTABLE CHEST - 1 VIEW  Comparison: Chest x-ray 09/16/2012.  Findings: Lung volumes are low.  There are bibasilar opacities, favored to represent subsegmental atelectasis.  Possible trace right pleural effusion.  Pulmonary venous congestion, without frank pulmonary edema.  Mild cardiomegaly. The patient is rotated to the right on today's exam, resulting in distortion of the mediastinal contours and reduced diagnostic sensitivity and specificity for mediastinal pathology.  Atherosclerosis in the thoracic aorta. Right-sided calcified pleural plaques again noted.  IMPRESSION: 1.  Low lung volumes with probable bibasilar subsegmental atelectasis and small right-sided  pleural effusion. 2.  Cardiomegaly with pulmonary venous congestion, but no frank pulmonary edema at this time. 3.  Atherosclerosis.   Original Report Authenticated By: Trudie Reed, M.D.      ASSESSMENT AND PLAN: Ms. Chisum has established coronary artery disease and is  now 9 years status post stenting of her LAD which had been done by Dr. Riley Kill. A stress test in May 2011 suggested the possibility of mild ischemia which is not felt to be a redo catheterization candidate. She denies any recurrent chest pain. Presently, I am recommending discontinuing amlodipine. I will start her on Cardizem 120 mg I will review her recent hospitalization records. I am scheduling her for a initial f/u evaluation with a physician extender in 4 weeks and follow up with me in 3 months     Lennette Bihari, MD, [facility]  10/13/2012 11:11 AM

## 2012-10-14 ENCOUNTER — Telehealth: Payer: Self-pay | Admitting: Cardiovascular Disease

## 2012-10-14 ENCOUNTER — Telehealth (HOSPITAL_COMMUNITY): Payer: Self-pay | Admitting: Oncology

## 2012-10-14 NOTE — Telephone Encounter (Signed)
Patient's daughter contact the clinic. Unfortunately, Nonna has had an arterial blood clot and has complications associated with that. The patient's daughter called to ask if her platelets had anything to do with her blood clots. On chart review, Nashira is a patient of ours with ITP that we were following. She has not required intervention from that standpoint because it was low grade. She does have peripheral arterial disease and this is a major culprit of her issue. She also has chronic diseases which would contribute including hyperkalemia, and high blood pressure just to name a few. Her age of course also puts her at increased risk for arterial blood clot. Review of her chart shows that she has been discharged from [facility] to a skilled nursing facility and not expected to to live long.  She is under the care of hospice at [facility].  Pharrell Ledford

## 2012-10-31 ENCOUNTER — Ambulatory Visit: Payer: Medicare Other | Admitting: Cardiovascular Disease

## 2012-11-07 ENCOUNTER — Emergency Department (HOSPITAL_COMMUNITY): Payer: Medicare Other

## 2012-11-07 ENCOUNTER — Emergency Department (HOSPITAL_COMMUNITY)
Admission: EM | Admit: 2012-11-07 | Discharge: 2012-11-07 | Disposition: A | Discharge status: Skilled Nursing Facility | Payer: Medicare Other | Attending: Emergency Medicine | Admitting: Emergency Medicine

## 2012-11-07 ENCOUNTER — Encounter (HOSPITAL_COMMUNITY): Payer: Self-pay | Admitting: Emergency Medicine

## 2012-11-07 DIAGNOSIS — F0390 Unspecified dementia without behavioral disturbance: Secondary | ICD-10-CM | POA: Insufficient documentation

## 2012-11-07 DIAGNOSIS — N390 Urinary tract infection, site not specified: Secondary | ICD-10-CM | POA: Insufficient documentation

## 2012-11-07 DIAGNOSIS — Z8679 Personal history of other diseases of the circulatory system: Secondary | ICD-10-CM | POA: Insufficient documentation

## 2012-11-07 DIAGNOSIS — Z862 Personal history of diseases of the blood and blood-forming organs and certain disorders involving the immune mechanism: Secondary | ICD-10-CM | POA: Insufficient documentation

## 2012-11-07 DIAGNOSIS — Z8701 Personal history of pneumonia (recurrent): Secondary | ICD-10-CM | POA: Insufficient documentation

## 2012-11-07 DIAGNOSIS — K219 Gastro-esophageal reflux disease without esophagitis: Secondary | ICD-10-CM | POA: Insufficient documentation

## 2012-11-07 DIAGNOSIS — N189 Chronic kidney disease, unspecified: Secondary | ICD-10-CM | POA: Insufficient documentation

## 2012-11-07 DIAGNOSIS — I2510 Atherosclerotic heart disease of native coronary artery without angina pectoris: Secondary | ICD-10-CM | POA: Insufficient documentation

## 2012-11-07 DIAGNOSIS — Z7901 Long term (current) use of anticoagulants: Secondary | ICD-10-CM | POA: Insufficient documentation

## 2012-11-07 DIAGNOSIS — F29 Unspecified psychosis not due to a substance or known physiological condition: Secondary | ICD-10-CM | POA: Insufficient documentation

## 2012-11-07 DIAGNOSIS — I5032 Chronic diastolic (congestive) heart failure: Secondary | ICD-10-CM | POA: Insufficient documentation

## 2012-11-07 DIAGNOSIS — Z7982 Long term (current) use of aspirin: Secondary | ICD-10-CM | POA: Insufficient documentation

## 2012-11-07 DIAGNOSIS — Z8709 Personal history of other diseases of the respiratory system: Secondary | ICD-10-CM | POA: Insufficient documentation

## 2012-11-07 DIAGNOSIS — Z79899 Other long term (current) drug therapy: Secondary | ICD-10-CM | POA: Insufficient documentation

## 2012-11-07 DIAGNOSIS — I4891 Unspecified atrial fibrillation: Secondary | ICD-10-CM

## 2012-11-07 DIAGNOSIS — E785 Hyperlipidemia, unspecified: Secondary | ICD-10-CM | POA: Insufficient documentation

## 2012-11-07 DIAGNOSIS — Z87440 Personal history of urinary (tract) infections: Secondary | ICD-10-CM | POA: Insufficient documentation

## 2012-11-07 DIAGNOSIS — I252 Old myocardial infarction: Secondary | ICD-10-CM | POA: Insufficient documentation

## 2012-11-07 DIAGNOSIS — Z8673 Personal history of transient ischemic attack (TIA), and cerebral infarction without residual deficits: Secondary | ICD-10-CM | POA: Insufficient documentation

## 2012-11-07 DIAGNOSIS — Z9861 Coronary angioplasty status: Secondary | ICD-10-CM | POA: Insufficient documentation

## 2012-11-07 DIAGNOSIS — E119 Type 2 diabetes mellitus without complications: Secondary | ICD-10-CM | POA: Insufficient documentation

## 2012-11-07 HISTORY — DX: Rheumatic tricuspid valve disease, unspecified: I07.9

## 2012-11-07 HISTORY — DX: Dysphagia, unspecified: R13.10

## 2012-11-07 HISTORY — DX: Hypoxemia: R09.02

## 2012-11-07 HISTORY — DX: Chronic diastolic (congestive) heart failure: I50.32

## 2012-11-07 HISTORY — DX: Acute myocardial infarction, unspecified: I21.9

## 2012-11-07 LAB — URINALYSIS, ROUTINE W REFLEX MICROSCOPIC
Nitrite: NEGATIVE
Protein, ur: NEGATIVE mg/dL
Urobilinogen, UA: 0.2 mg/dL (ref 0.0–1.0)
pH: 5.5 (ref 5.0–8.0)

## 2012-11-07 LAB — POCT I-STAT, CHEM 8
BUN: 16 mg/dL (ref 6–23)
Creatinine, Ser: 0.6 mg/dL (ref 0.50–1.10)
Glucose, Bld: 113 mg/dL — ABNORMAL HIGH (ref 70–99)
Hemoglobin: 11.2 g/dL — ABNORMAL LOW (ref 12.0–15.0)
TCO2: 21 mmol/L (ref 0–100)

## 2012-11-07 MED ORDER — MORPHINE SULFATE 2 MG/ML IJ SOLN
2.0000 mg | Freq: Once | INTRAMUSCULAR | Status: AC
  Administered 2012-11-07: 2 mg via INTRAVENOUS
  Filled 2012-11-07: qty 1

## 2012-11-07 MED ORDER — SODIUM CHLORIDE 0.9 % IV BOLUS (SEPSIS)
500.0000 mL | Freq: Once | INTRAVENOUS | Status: AC
  Administered 2012-11-07: 500 mL via INTRAVENOUS

## 2012-11-07 MED ORDER — DEXTROSE 5 % IV SOLN
1.0000 g | INTRAVENOUS | Status: DC
  Administered 2012-11-07: 1 g via INTRAVENOUS
  Filled 2012-11-07: qty 10

## 2012-11-07 MED ORDER — DEXTROSE 50 % IV SOLN: 50.0000 mL | Freq: Once | INTRAVENOUS | Status: AC

## 2012-11-07 MED ORDER — DEXTROSE 50 % IV SOLN
INTRAVENOUS | Status: AC
  Administered 2012-11-07: 50 mL via INTRAVENOUS
  Filled 2012-11-07: qty 50

## 2012-11-07 NOTE — ED Notes (Signed)
Patient transferred back to [facility] via EMS.  LAC IV intact. VSS. Report given to EMS.  Report called to [facility] by PM RN.

## 2012-11-07 NOTE — ED Notes (Signed)
Patient presents to ER via [facility] from [facility] with c/o change in mental status.  Patient has a necrotic right foot that family refuses to have amputated due to religious beliefs.  Per [facility], patient has had a change in mental status today and has been pocketing food and medication.  Daughter requested patient be sent to ER for further evaluation.  Patient is a DNR per records from [facility].

## 2012-11-07 NOTE — ED Notes (Signed)
Per discharge instructions, IV is to be left in place for use at [facility]. [facility] staff notified and verbalized understanding of these orders.

## 2012-11-07 NOTE — ED Provider Notes (Signed)
History    CSN: [id number] Arrival date & time 11/07/12  0029  First MD Initiated Contact with Patient 11/07/12 [phone]     Chief Complaint  Patient presents with  . Altered Mental Status    Patient is a 79 y.o. female presenting with altered mental status. The history is provided by a relative. The history is limited by the condition of the patient.  Altered Mental Status Presenting symptoms: confusion   Severity:  Moderate Most recent episode:  Today Timing:  Constant Progression:  Worsening Chronicity:  New Associated symptoms: no fever   pt presents from nursing facility for AMS Per daughter, she has had decreased PO today, decreased interaction, and also not eating and pocketing her meds Daughter confirms that she is a DNR She has h/o right necrotic foot but family has resisted surgery due to religious beliefs and also due to multiple medical problems No falls reported No fever/vomiting reported Past Medical History  Diagnosis Date  . Coronary artery disease   . Ventricular tachycardia   . CHF (congestive heart failure)   . GERD (gastroesophageal reflux disease)   . Atrial flutter   . Sick sinus syndrome   . Dementia   . Diabetes mellitus   . Thrombocytopenia   . Right bundle branch block   . Hyperlipidemia   . TIA (transient ischemic attack)   . Arthritis   . ITP (idiopathic thrombocytopenic purpura) 07/14/2011    Chronic low grade idiopathic thrombocytopenia purpura still not in need of therapy.   Marland Kitchen URI (upper respiratory infection)     history  . Hx: UTI (urinary tract infection)   . Chronic kidney disease   . Pneumonia   . Acute MI   . Chronic diastolic heart failure   . Diseases of tricuspid valve   . Dysphagia   . Hypoxemia    Past Surgical History  Procedure Laterality Date  . Coronary angioplasty with stent placement    . Biopsy thyroid     No family history on file. History  Substance Use Topics  . Smoking status: Never Smoker   . Smokeless  tobacco: Never Used  . Alcohol Use: No   OB History   Grav Para Term Preterm Abortions TAB SAB Ect Mult Living                 Review of Systems  Unable to perform ROS: Mental status change  Constitutional: Negative for fever.  Psychiatric/Behavioral: Positive for confusion and altered mental status.    Allergies  Review of patient's allergies indicates no known allergies.  Home Medications   Current Outpatient Rx  Name  Route  Sig  Dispense  Refill  . aspirin 81 MG tablet   Oral   Take 81 mg by mouth every morning.          Marland Kitchen co-enzyme Q-10 50 MG capsule   Oral   Take 300 mg by mouth daily.         Marland Kitchen albuterol-ipratropium (COMBIVENT) 18-103 MCG/ACT inhaler   Inhalation   Inhale 2 puffs into the lungs every 6 (six) hours as needed for wheezing.         Marland Kitchen amoxicillin-clavulanate (AUGMENTIN) 500-125 MG per tablet   Oral   Take 1 tablet (500 mg total) by mouth 3 (three) times daily. 5 more days           Pharmacy can adjust the dose for pneumonia   . carvedilol (COREG) 3.125 MG tablet   Oral  Take 1 tablet (3.125 mg total) by mouth 2 (two) times daily with a meal.         . enoxaparin (LOVENOX) 40 MG/0.4ML injection   Subcutaneous   Inject 0.4 mLs (40 mg total) into the skin daily. Stop once the INR is 2   0 Syringe      . guaiFENesin (MUCINEX) 600 MG 12 hr tablet   Oral   Take 600 mg by mouth every morning.           Marland Kitchen HYDROcodone-acetaminophen (NORCO/VICODIN) 5-325 MG per tablet   Oral   Take 1 tablet by mouth every 4 (four) hours as needed.   30 tablet   0   . isosorbide mononitrate (IMDUR) 60 MG 24 hr tablet   Oral   Take 60 mg by mouth every morning.          . Multiple Vitamins-Iron (MULTIVITAMIN/IRON) TABS   Oral   Take 1 tablet by mouth every morning.           . nitroGLYCERIN (NITROSTAT) 0.4 MG SL tablet   Sublingual   Place 0.4 mg under the tongue every 5 (five) minutes as needed.         . pantoprazole (PROTONIX) 40 MG  tablet   Oral   Take 40 mg by mouth every morning.          . potassium chloride SA (K-DUR,KLOR-CON) 20 MEQ tablet   Oral   Take 20 mEq by mouth every morning.          . simvastatin (ZOCOR) 20 MG tablet   Oral   Take 20 mg by mouth at bedtime.          . solifenacin (VESICARE) 5 MG tablet   Oral   Take 5 mg by mouth every morning.          . warfarin (COUMADIN) 2 MG tablet   Oral   Take 2.5 mg by mouth daily at 6 PM.          BP 126/69  Pulse 54  Temp(Src) 97.8 F (36.6 C) (Axillary)  Resp 24  SpO2 100% Physical Exam CONSTITUTIONAL: elderly, ill appearing HEAD: Normocephalic/atraumatic ENMT: Mucous membranes dry NECK: supple no meningeal signs CV: no murmurs/rubs/gallops noted LUNGS: coarse BS noted bilaterally ABDOMEN: soft, nontender, no rebound or guarding GU:no cva tenderness NEURO: Pt is somnolent but arousable.  Maex4.  She is not conversant.  She will follow some commands then drift back to sleep. EXTREMITIES: right tibia is wrapped in gauze.  Right foot is necrotic and cold to touch PSYCH:somnolent  ED Course  Procedures   PROCEDURE - LAB DRAW VIA RIGHT RADIAL ARTERY PT VERY DIFFICULT LAB DRAW REQUIRED ARTERIAL STICK BY MYSELF TO OBTAIN BLOOD STERILE PRECAUTIONS USED ACCESS RIGHT RADIAL ARTERY WITHOUT DIFFICULTY PRESSURE HELD TO AREA FOR 2 MINUTES NO ACTIVE BLEEDING.  NO HEMATOMA/THRILL NOTED PT TOLERATED WELL Labs Reviewed  CBC WITH DIFFERENTIAL  BASIC METABOLIC PANEL  URINALYSIS, ROUTINE W REFLEX MICROSCOPIC  PROTIME-INR   Dg Chest Portable 1 View  11/07/2012   *RADIOLOGY REPORT*  Clinical Data: Altered mental status.  PORTABLE CHEST - 1 VIEW  Comparison: 10/05/2012  Findings: Cardiac enlargement with normal pulmonary vascularity. Increased density in the right lung base appears represent contrast material in the bronchi suggesting aspiration.  This is stable since previous study.  There is improvement of previously identified patchy  infiltration.  No focal consolidation today.  No blunting of costophrenic angles.  No pneumothorax.  Calcification of the aorta.  Mass in the right thoracic inlet deviating the trachea towards the left, likely representing a thyroid goiter.  IMPRESSION: Increased density in the right lung base suggesting previous aspiration of high-density material, stable.  Interval resolution of infiltrates since previous study.  Cardiac enlargement without pulmonary vascular congestion.  No focal consolidation.   Original Report Authenticated By: Burman Nieves, M.D.   3:28 AM uti noted Unable to obtain labs at this time.  IV rocephin given Per last d/c summary, family was advised of poor prognosis and palliative medicine was involved with patient 4:41 AM Pt with minimal improvement in her mental status.  She is stable Discussed results with family Daughter is requesting laboratory evaluation to "see if anything else is going on" She has been a difficult lab draw, will try again for labs. 5:42 AM Electrolytes appear at baseline Ct head and CXR at baseline Her glucose improved in the ED. She was monitored in the ED for several hours She would have some episodes of waking up and responding and then would go back to sleep.  She is otherwise stable Her change in mental status could be combination of medications, UTI and potentially infection from necrotic foot She does not appear septic at this time As stated previously, Pt is a DNR. She can receive IV antibiotics in her facility.  I suggested a week course of rocephin and she should have re-assessment by her physician within 24-48 hours.  I do not feel admission is warranted since she can receive meds via IV access and nutrition as well via IV access. I discussed this with daughter at length.  She is agreeable with plan at this time    MDM  Nursing notes including past medical history and social history reviewed and considered in documentation xrays reviewed  and considered Labs/vital reviewed and considered     Date: 11/07/2012  Rate: 119  Rhythm: atrial fibrillation  QRS Axis: left  Intervals: normal  ST/T Wave abnormalities: nonspecific ST changes  Conduction Disutrbances:right bundle branch block  Narrative Interpretation:   Old EKG Reviewed: RBBB has been seen previously Artifact noted in V3 which limits interpretation         Joya Gaskins, MD 11/07/12 [phone]

## 2012-11-09 LAB — URINE CULTURE: Colony Count: >=100000

## 2012-11-09 NOTE — Progress Notes (Signed)
  ED Antimicrobial Stewardship Positive Culture Follow Up   [name] is an 79 y.o. female who presented to [facility] on 11/07/2012 with a chief complaint of  Chief Complaint  Patient presents with  . Altered Mental Status    Recent Results (from the past 720 hour(s))  URINE CULTURE     Status: None   Collection Time    11/07/12  1:57 AM      Result Value Range Status   Specimen Description URINE, CLEAN CATCH   Final   Special Requests NONE   Final   Culture  Setup Time 11/07/2012 02:37   Final   Colony Count >=100,000 COLONIES/ML   Final   Culture ENTEROCOCCUS SPECIES   Final   Report Status 11/09/2012 FINAL   Final   Organism ID, Bacteria ENTEROCOCCUS SPECIES   Final    Patient presented from [facility] facility with altered mental status. EDP recommended facility treat UTI with Rocephin IV x 7 days. Urine culture grew Enterococcus which is resistant to Rocephin.  Recommendation: Please fax culture results to [facility] facility. Recommend to stop Rocephin and treat with Amoxicillin 500mg  PO BID x 7 days or Ampicillin 1g IV q12hrs if IV formulation is required.  ED Provider: Junious Silk, PA-C   Cleon Dew 11/09/2012, 10:35 AM Infectious Diseases Pharmacist Phone# [phone]

## 2012-11-14 ENCOUNTER — Other Ambulatory Visit (HOSPITAL_COMMUNITY): Payer: Medicare Other

## 2012-11-16 ENCOUNTER — Ambulatory Visit (HOSPITAL_COMMUNITY): Payer: Medicare Other | Admitting: Oncology

## 2012-11-16 NOTE — Progress Notes (Signed)
-  No show, letter sent-   

## 2012-11-19 ENCOUNTER — Encounter (HOSPITAL_COMMUNITY): Payer: Self-pay | Admitting: Emergency Medicine

## 2012-11-19 ENCOUNTER — Inpatient Hospital Stay (HOSPITAL_COMMUNITY)
Admission: EM | Admit: 2012-11-19 | Discharge: 2012-12-19 | DRG: 871 | Disposition: E | Payer: Medicare Other | Attending: Family Medicine | Admitting: Family Medicine

## 2012-11-19 ENCOUNTER — Emergency Department (HOSPITAL_COMMUNITY): Payer: Medicare Other

## 2012-11-19 DIAGNOSIS — Z66 Do not resuscitate: Secondary | ICD-10-CM | POA: Diagnosis present

## 2012-11-19 DIAGNOSIS — A419 Sepsis, unspecified organism: Principal | ICD-10-CM

## 2012-11-19 DIAGNOSIS — E860 Dehydration: Secondary | ICD-10-CM

## 2012-11-19 DIAGNOSIS — I472 Ventricular tachycardia, unspecified: Secondary | ICD-10-CM | POA: Diagnosis present

## 2012-11-19 DIAGNOSIS — M129 Arthropathy, unspecified: Secondary | ICD-10-CM | POA: Diagnosis present

## 2012-11-19 DIAGNOSIS — G9340 Encephalopathy, unspecified: Secondary | ICD-10-CM

## 2012-11-19 DIAGNOSIS — J9600 Acute respiratory failure, unspecified whether with hypoxia or hypercapnia: Secondary | ICD-10-CM | POA: Diagnosis present

## 2012-11-19 DIAGNOSIS — E1169 Type 2 diabetes mellitus with other specified complication: Secondary | ICD-10-CM | POA: Diagnosis present

## 2012-11-19 DIAGNOSIS — I252 Old myocardial infarction: Secondary | ICD-10-CM

## 2012-11-19 DIAGNOSIS — N189 Chronic kidney disease, unspecified: Secondary | ICD-10-CM | POA: Diagnosis present

## 2012-11-19 DIAGNOSIS — Z7189 Other specified counseling: Secondary | ICD-10-CM

## 2012-11-19 DIAGNOSIS — J9601 Acute respiratory failure with hypoxia: Secondary | ICD-10-CM

## 2012-11-19 DIAGNOSIS — F0390 Unspecified dementia without behavioral disturbance: Secondary | ICD-10-CM

## 2012-11-19 DIAGNOSIS — K219 Gastro-esophageal reflux disease without esophagitis: Secondary | ICD-10-CM | POA: Diagnosis present

## 2012-11-19 DIAGNOSIS — I4892 Unspecified atrial flutter: Secondary | ICD-10-CM

## 2012-11-19 DIAGNOSIS — I495 Sick sinus syndrome: Secondary | ICD-10-CM | POA: Diagnosis present

## 2012-11-19 DIAGNOSIS — Z9861 Coronary angioplasty status: Secondary | ICD-10-CM

## 2012-11-19 DIAGNOSIS — I5042 Chronic combined systolic (congestive) and diastolic (congestive) heart failure: Secondary | ICD-10-CM | POA: Diagnosis present

## 2012-11-19 DIAGNOSIS — E41 Nutritional marasmus: Secondary | ICD-10-CM | POA: Diagnosis present

## 2012-11-19 DIAGNOSIS — D693 Immune thrombocytopenic purpura: Secondary | ICD-10-CM | POA: Diagnosis present

## 2012-11-19 DIAGNOSIS — Z681 Body mass index (BMI) 19 or less, adult: Secondary | ICD-10-CM

## 2012-11-19 DIAGNOSIS — I96 Gangrene, not elsewhere classified: Secondary | ICD-10-CM

## 2012-11-19 DIAGNOSIS — I2510 Atherosclerotic heart disease of native coronary artery without angina pectoris: Secondary | ICD-10-CM | POA: Diagnosis present

## 2012-11-19 DIAGNOSIS — R6520 Severe sepsis without septic shock: Secondary | ICD-10-CM | POA: Diagnosis present

## 2012-11-19 DIAGNOSIS — I509 Heart failure, unspecified: Secondary | ICD-10-CM | POA: Diagnosis present

## 2012-11-19 DIAGNOSIS — Z515 Encounter for palliative care: Secondary | ICD-10-CM

## 2012-11-19 DIAGNOSIS — I4510 Unspecified right bundle-branch block: Secondary | ICD-10-CM | POA: Diagnosis present

## 2012-11-19 DIAGNOSIS — E785 Hyperlipidemia, unspecified: Secondary | ICD-10-CM | POA: Diagnosis present

## 2012-11-19 DIAGNOSIS — R627 Adult failure to thrive: Secondary | ICD-10-CM | POA: Diagnosis present

## 2012-11-19 DIAGNOSIS — I4729 Other ventricular tachycardia: Secondary | ICD-10-CM | POA: Diagnosis present

## 2012-11-19 DIAGNOSIS — I70269 Atherosclerosis of native arteries of extremities with gangrene, unspecified extremity: Secondary | ICD-10-CM

## 2012-11-19 DIAGNOSIS — R1310 Dysphagia, unspecified: Secondary | ICD-10-CM | POA: Diagnosis present

## 2012-11-19 DIAGNOSIS — I5023 Acute on chronic systolic (congestive) heart failure: Secondary | ICD-10-CM

## 2012-11-19 LAB — CBC WITH DIFFERENTIAL/PLATELET
Basophils Relative: 0 % (ref 0–1)
Eosinophils Absolute: 0 10*3/uL (ref 0.0–0.7)
Eosinophils Relative: 0 % (ref 0–5)
Hemoglobin: 12.6 g/dL (ref 12.0–15.0)
Lymphs Abs: 1.5 10*3/uL (ref 0.7–4.0)
MCH: 29.5 pg (ref 26.0–34.0)
MCHC: 31.7 g/dL (ref 30.0–36.0)
MCV: 93 fL (ref 78.0–100.0)
Monocytes Absolute: 0.7 10*3/uL (ref 0.1–1.0)
Monocytes Relative: 11 % (ref 3–12)
Neutrophils Relative %: 64 % (ref 43–77)
RBC: 4.27 MIL/uL (ref 3.87–5.11)

## 2012-11-19 LAB — URINALYSIS, ROUTINE W REFLEX MICROSCOPIC
Glucose, UA: NEGATIVE mg/dL
Hgb urine dipstick: NEGATIVE
Specific Gravity, Urine: >1.03 — ABNORMAL HIGH (ref 1.005–1.030)
Urobilinogen, UA: 0.2 mg/dL (ref 0.0–1.0)
pH: 5.5 (ref 5.0–8.0)

## 2012-11-19 LAB — BASIC METABOLIC PANEL
BUN: 27 mg/dL — ABNORMAL HIGH (ref 6–23)
Calcium: 10.2 mg/dL (ref 8.4–10.5)
Creatinine, Ser: 0.69 mg/dL (ref 0.50–1.10)
GFR calc Af Amer: 87 mL/min — ABNORMAL LOW (ref >90)
GFR calc non Af Amer: 75 mL/min — ABNORMAL LOW (ref >90)
Glucose, Bld: 81 mg/dL (ref 70–99)

## 2012-11-19 LAB — PROTIME-INR: Prothrombin Time: 13.3 seconds (ref 11.6–15.2)

## 2012-11-19 MED ORDER — SODIUM CHLORIDE 0.9 % IV BOLUS (SEPSIS)
250.0000 mL | Freq: Once | INTRAVENOUS | Status: AC
  Administered 2012-11-19: 250 mL via INTRAVENOUS

## 2012-11-19 MED ORDER — SODIUM CHLORIDE 0.9 % IV SOLN
INTRAVENOUS | Status: DC
  Administered 2012-11-19 – 2012-11-20 (×2): via INTRAVENOUS

## 2012-11-19 NOTE — ED Notes (Signed)
Patient is a resident at [facility] with a necrotic foot that family refuses to have amputated.  Patient presents to ER via [facility] with c/o altered mental status. Per report from nurse at [facility], order was received to have IV fluids at [facility] and family refused for patient to be stuck and told to send to patient to ER.  Patient here per family request.  Nurse from [facility] states patient is at baseline and has made comments that she is ready to go.  Patient is dehydrated; hence the orders for IV fluid at [facility].

## 2012-11-19 NOTE — ED Provider Notes (Signed)
CSN: [id number]     Arrival date & time 12/01/12  2018 History     First MD Initiated Contact with Patient 12-01-12 2020     Chief Complaint  Patient presents with  . Altered Mental Status    Patient is a 79 y.o. female presenting with altered mental status. The history is provided by the nursing home and the EMS personnel. The history is limited by the condition of the patient (Hx dementia).  Altered Mental Status Pt was seen at 2040.  Per EMS, NH report, and family, c/o pt with gradual onset and persistence of constant AMS for the past several weeks.  Pt has not been eating or drinking well, per family, and is "dehydrated." NH was to start an IV to dose IVF for same, but the family insisted the pt needed to be sent to the hospital.  NH states pt is acting per her baseline. No reported fevers, N/V/D, or falls. Pt has hx of necrotic RLE which family does not want amputated due to religious beliefs and Vasc Surgery feels that due to her multiple medical problems she is not a surgical candidate.  Pt is a DNR.   Past Medical History  Diagnosis Date  . Coronary artery disease   . Ventricular tachycardia   . CHF (congestive heart failure)   . GERD (gastroesophageal reflux disease)   . Atrial flutter   . Sick sinus syndrome   . Dementia   . Diabetes mellitus   . Thrombocytopenia   . Right bundle branch block   . Hyperlipidemia   . TIA (transient ischemic attack)   . Arthritis   . ITP (idiopathic thrombocytopenic purpura) 07/14/2011    Chronic low grade idiopathic thrombocytopenia purpura still not in need of therapy.   Marland Kitchen URI (upper respiratory infection)     history  . Hx: UTI (urinary tract infection)   . Chronic kidney disease   . Pneumonia   . Acute MI   . Chronic diastolic heart failure   . Diseases of tricuspid valve   . Dysphagia   . Hypoxemia    Past Surgical History  Procedure Laterality Date  . Coronary angioplasty with stent placement    . Biopsy thyroid       History  Substance Use Topics  . Smoking status: Never Smoker   . Smokeless tobacco: Never Used  . Alcohol Use: No    Review of Systems  Unable to perform ROS: Dementia  Psychiatric/Behavioral: Positive for altered mental status.    Allergies  Review of patient's allergies indicates no known allergies.  Home Medications   Current Outpatient Rx  Name  Route  Sig  Dispense  Refill  . albuterol-ipratropium (COMBIVENT) 18-103 MCG/ACT inhaler   Inhalation   Inhale 2 puffs into the lungs every 6 (six) hours as needed for wheezing.         Marland Kitchen aspirin 81 MG tablet   Oral   Take 81 mg by mouth every morning.          . carvedilol (COREG) 3.125 MG tablet   Oral   Take 1 tablet (3.125 mg total) by mouth 2 (two) times daily with a meal.         . co-enzyme Q-10 50 MG capsule   Oral   Take 300 mg by mouth daily.         Marland Kitchen enoxaparin (LOVENOX) 40 MG/0.4ML injection   Subcutaneous   Inject 0.4 mLs (40 mg total) into the skin daily.  Stop once the INR is 2   0 Syringe      . guaiFENesin (MUCINEX) 600 MG 12 hr tablet   Oral   Take 600 mg by mouth every morning.           Marland Kitchen HYDROcodone-acetaminophen (NORCO/VICODIN) 5-325 MG per tablet   Oral   Take 1 tablet by mouth at bedtime.         . isosorbide mononitrate (IMDUR) 60 MG 24 hr tablet   Oral   Take 60 mg by mouth every morning.          . Multiple Vitamins-Iron (MULTIVITAMIN/IRON) TABS   Oral   Take 1 tablet by mouth every morning.           . nitroGLYCERIN (NITROSTAT) 0.4 MG SL tablet   Sublingual   Place 0.4 mg under the tongue every 5 (five) minutes as needed.         . pantoprazole (PROTONIX) 40 MG tablet   Oral   Take 40 mg by mouth every morning.          . potassium chloride SA (K-DUR,KLOR-CON) 20 MEQ tablet   Oral   Take 20 mEq by mouth every morning.          . simvastatin (ZOCOR) 20 MG tablet   Oral   Take 20 mg by mouth at bedtime.          . solifenacin (VESICARE) 5 MG  tablet   Oral   Take 5 mg by mouth every morning.          . warfarin (COUMADIN) 2 MG tablet   Oral   Take 2.5 mg by mouth daily at 6 PM.          BP 102/61  Pulse 121  Temp(Src) 99.2 F (37.3 C) (Rectal)  Resp 15  Wt 86 lb (39.009 kg)  BMI 16.8 kg/m2  SpO2 100% BP 99/61  Pulse 115  Temp(Src) 99.2 F (37.3 C) (Rectal)  Resp 18  Wt 86 lb (39.009 kg)  BMI 16.8 kg/m2  SpO2 95%  Physical Exam 2045: Physical examination:  Nursing notes reviewed; Vital signs and O2 SAT reviewed;  Constitutional: Thin, frail, In no acute distress; Head:  Normocephalic, atraumatic; Eyes: EOMI, PERRL, No scleral icterus; ENMT: Mouth and pharynx normal, Mucous membranes dry and cracked; Neck: Supple, Full range of motion, No lymphadenopathy; Cardiovascular: Irregular irregular rate and rhythm, No gallop; Respiratory: Breath sounds clear & equal bilaterally, No wheezes. Normal respiratory effort/excursion; Chest: Nontender, Movement normal; Abdomen: Soft, Nontender, Nondistended, Normal bowel sounds;; Extremities: Right lower leg is necrotic and cold to touch. No deformity. No edema.; Neuro: Lethargic, awakens to stimulation. Opens her eyes spontaneously and looks around. Nonverbal per baseline. Moves extremities on stretcher spontaneously.; Skin: Color normal, Warm, Dry.   ED Course   Procedures     MDM  MDM Reviewed: previous chart, nursing note and vitals Reviewed previous: labs and ECG Interpretation: labs, ECG and x-ray    Date: 11/18/2012  Rate: 142  Rhythm: atrial fibrillation  QRS Axis: left  Intervals: normal  ST/T Wave abnormalities: nonspecific ST/T changes  Conduction Disutrbances:right bundle branch block  Narrative Interpretation:   Old EKG Reviewed: unchanged; no significant changes from previous EKG dated 11/07/2012.  Results for orders placed during the hospital encounter of 12/16/2012  CBC WITH DIFFERENTIAL      Result Value Range   WBC 5.9  4.0 - 10.5 K/uL   RBC 4.27   3.87 - 5.11  MIL/uL   Hemoglobin 12.6  12.0 - 15.0 g/dL   HCT 45.4  09.8 - 11.9 %   MCV 93.0  78.0 - 100.0 fL   MCH 29.5  26.0 - 34.0 pg   MCHC 31.7  30.0 - 36.0 g/dL   RDW 14.7 (*) 82.9 - 56.2 %   Platelets 136 (*) 150 - 400 K/uL   Neutrophils Relative % 64  43 - 77 %   Neutro Abs 3.7  1.7 - 7.7 K/uL   Lymphocytes Relative 25  12 - 46 %   Lymphs Abs 1.5  0.7 - 4.0 K/uL   Monocytes Relative 11  3 - 12 %   Monocytes Absolute 0.7  0.1 - 1.0 K/uL   Eosinophils Relative 0  0 - 5 %   Eosinophils Absolute 0.0  0.0 - 0.7 K/uL   Basophils Relative 0  0 - 1 %   Basophils Absolute 0.0  0.0 - 0.1 K/uL  BASIC METABOLIC PANEL      Result Value Range   Sodium 143  135 - 145 mEq/L   Potassium 4.5  3.5 - 5.1 mEq/L   Chloride 109  96 - 112 mEq/L   CO2 22  19 - 32 mEq/L   Glucose, Bld 81  70 - 99 mg/dL   BUN 27 (*) 6 - 23 mg/dL   Creatinine, Ser 1.30  0.50 - 1.10 mg/dL   Calcium 86.5  8.4 - 78.4 mg/dL   GFR calc non Af Amer 75 (*) >90 mL/min   GFR calc Af Amer 87 (*) >90 mL/min  LACTIC ACID, PLASMA      Result Value Range   Lactic Acid, Venous 1.5  0.5 - 2.2 mmol/L  TROPONIN I      Result Value Range   Troponin I <0.30  <0.30 ng/mL  PROTIME-INR      Result Value Range   Prothrombin Time 13.3  11.6 - 15.2 seconds   INR 1.03  0.00 - 1.49  URINALYSIS, ROUTINE W REFLEX MICROSCOPIC      Result Value Range   Color, Urine YELLOW  YELLOW   APPearance CLEAR  CLEAR   Specific Gravity, Urine >1.030 (*) 1.005 - 1.030   pH 5.5  5.0 - 8.0   Glucose, UA NEGATIVE  NEGATIVE mg/dL   Hgb urine dipstick NEGATIVE  NEGATIVE   Bilirubin Urine SMALL (*) NEGATIVE   Ketones, ur TRACE (*) NEGATIVE mg/dL   Protein, ur NEGATIVE  NEGATIVE mg/dL   Urobilinogen, UA 0.2  0.0 - 1.0 mg/dL   Nitrite NEGATIVE  NEGATIVE   Leukocytes, UA NEGATIVE  NEGATIVE    Dg Chest Port 1 View 2012-12-15   *RADIOLOGY REPORT*  Clinical Data: 79 year old female with shortness of breath and altered mental status.  PORTABLE CHEST - 1  VIEW  Comparison: 11/07/2012 and prior chest radiographs  Findings: Cardiomegaly again noted. The left retrocardiac region is difficult to evaluate but has a similar appearance to prior studies. Fullness of superior mediastinum is unchanged. High density within the right lower lung is compatible with prior aspiration. There is no evidence of focal airspace disease, pulmonary edema, suspicious pulmonary nodule/mass, pleural effusion, or pneumothorax. No acute bony abnormalities are identified.  IMPRESSION: Cardiomegaly without definite evidence of acute cardiopulmonary disease.   Original Report Authenticated By: Harmon Pier, M.D.     0045:  Pt appears clinically dehydrated. Will dose judicious IVF given hx of CHF. Long d/w multiple family members: confirm pt is DNR, but refuse to  have her sent back to the NH tonight and strongly request pt "be admitted for IVF and get a PICC line." This d/w multiple family members, as pt can likely be admitted under observation for IVF tonight, but would have to d/w the Hospitalist service further. Verb understanding. T/C to [facility] Dr. Sharl Ma, case discussed, including:  HPI, pertinent PM/SHx, VS/PE, dx testing, ED course and treatment:  Agreeable to observation admit, requests to write temporary orders, obtain medical bed.        Laray Anger, DO 11/22/12 1222

## 2012-11-20 ENCOUNTER — Encounter (HOSPITAL_COMMUNITY): Payer: Self-pay | Admitting: *Deleted

## 2012-11-20 DIAGNOSIS — I4892 Unspecified atrial flutter: Secondary | ICD-10-CM

## 2012-11-20 DIAGNOSIS — E860 Dehydration: Secondary | ICD-10-CM

## 2012-11-20 DIAGNOSIS — I70269 Atherosclerosis of native arteries of extremities with gangrene, unspecified extremity: Secondary | ICD-10-CM

## 2012-11-20 DIAGNOSIS — I5023 Acute on chronic systolic (congestive) heart failure: Secondary | ICD-10-CM

## 2012-11-20 LAB — COMPREHENSIVE METABOLIC PANEL
ALT: 6 U/L (ref 0–35)
Albumin: 1.9 g/dL — ABNORMAL LOW (ref 3.5–5.2)
Alkaline Phosphatase: 60 U/L (ref 39–117)
Calcium: 9.3 mg/dL (ref 8.4–10.5)
GFR calc Af Amer: >90 mL/min (ref >90)
Potassium: 4 mEq/L (ref 3.5–5.1)
Sodium: 144 mEq/L (ref 135–145)
Total Protein: 5.7 g/dL — ABNORMAL LOW (ref 6.0–8.3)

## 2012-11-20 LAB — GLUCOSE, CAPILLARY
Glucose-Capillary: 99 mg/dL (ref 70–99)
Glucose-Capillary: <10 mg/dL — CL (ref 70–99)

## 2012-11-20 LAB — CBC
MCH: 29.3 pg (ref 26.0–34.0)
MCHC: 31.1 g/dL (ref 30.0–36.0)
RDW: 20 % — ABNORMAL HIGH (ref 11.5–15.5)

## 2012-11-20 LAB — MRSA PCR SCREENING: MRSA by PCR: POSITIVE — AB

## 2012-11-20 LAB — PROTIME-INR: Prothrombin Time: 14.2 seconds (ref 11.6–15.2)

## 2012-11-20 MED ORDER — DEXTROSE-NACL 5-0.45 % IV SOLN
INTRAVENOUS | Status: DC
  Administered 2012-11-20 – 2012-11-22 (×5): via INTRAVENOUS

## 2012-11-20 MED ORDER — CHLORHEXIDINE GLUCONATE CLOTH 2 % EX PADS
6.0000 | MEDICATED_PAD | Freq: Every day | CUTANEOUS | Status: AC
  Administered 2012-11-20 – 2012-11-23 (×4): 6 via TOPICAL

## 2012-11-20 MED ORDER — PIPERACILLIN-TAZOBACTAM 3.375 G IVPB
3.3750 g | Freq: Three times a day (TID) | INTRAVENOUS | Status: DC
  Administered 2012-11-20 – 2012-11-23 (×12): 3.375 g via INTRAVENOUS
  Filled 2012-11-20 (×18): qty 50

## 2012-11-20 MED ORDER — SIMVASTATIN 20 MG PO TABS
20.0000 mg | ORAL_TABLET | Freq: Every day | ORAL | Status: DC
  Filled 2012-11-20: qty 1

## 2012-11-20 MED ORDER — IPRATROPIUM-ALBUTEROL 18-103 MCG/ACT IN AERO
2.0000 | INHALATION_SPRAY | Freq: Four times a day (QID) | RESPIRATORY_TRACT | Status: DC | PRN
  Filled 2012-11-20: qty 14.7

## 2012-11-20 MED ORDER — VANCOMYCIN HCL IN DEXTROSE 750-5 MG/150ML-% IV SOLN
750.0000 mg | Freq: Once | INTRAVENOUS | Status: AC
  Administered 2012-11-20: 750 mg via INTRAVENOUS
  Filled 2012-11-20: qty 150

## 2012-11-20 MED ORDER — SODIUM CHLORIDE 0.9 % IV SOLN: INTRAVENOUS | Status: AC

## 2012-11-20 MED ORDER — VANCOMYCIN HCL IN DEXTROSE 750-5 MG/150ML-% IV SOLN
INTRAVENOUS | Status: AC
  Filled 2012-11-20: qty 150

## 2012-11-20 MED ORDER — MORPHINE SULFATE 2 MG/ML IJ SOLN
1.0000 mg | INTRAMUSCULAR | Status: DC | PRN
  Administered 2012-11-20 – 2012-11-23 (×8): 1 mg via INTRAVENOUS
  Filled 2012-11-20 (×8): qty 1

## 2012-11-20 MED ORDER — ENOXAPARIN SODIUM 40 MG/0.4ML ~~LOC~~ SOLN: 40.0000 mg | SUBCUTANEOUS | Status: DC

## 2012-11-20 MED ORDER — ASPIRIN EC 81 MG PO TBEC
81.0000 mg | DELAYED_RELEASE_TABLET | Freq: Every day | ORAL | Status: DC
  Filled 2012-11-20: qty 1

## 2012-11-20 MED ORDER — DEXTROSE 50 % IV SOLN
INTRAVENOUS | Status: AC
  Administered 2012-11-20: 50 mL
  Filled 2012-11-20: qty 50

## 2012-11-20 MED ORDER — VANCOMYCIN HCL 500 MG IV SOLR
500.0000 mg | INTRAVENOUS | Status: DC
  Administered 2012-11-21 – 2012-11-23 (×3): 500 mg via INTRAVENOUS
  Filled 2012-11-20 (×5): qty 500

## 2012-11-20 MED ORDER — MUPIROCIN 2 % EX OINT
1.0000 "application " | TOPICAL_OINTMENT | Freq: Two times a day (BID) | CUTANEOUS | Status: AC
  Administered 2012-11-20 – 2012-11-24 (×9): 1 via NASAL
  Filled 2012-11-20: qty 22

## 2012-11-20 MED ORDER — ASPIRIN 81 MG PO TABS
81.0000 mg | ORAL_TABLET | Freq: Every morning | ORAL | Status: DC
  Filled 2012-11-20 (×2): qty 1

## 2012-11-20 MED ORDER — DEXTROSE 50 % IV SOLN
25.0000 mL | Freq: Once | INTRAVENOUS | Status: AC | PRN
  Administered 2012-11-20: 25 mL via INTRAVENOUS

## 2012-11-20 MED ORDER — ISOSORBIDE MONONITRATE ER 60 MG PO TB24
30.0000 mg | ORAL_TABLET | Freq: Every day | ORAL | Status: DC
  Filled 2012-11-20: qty 1

## 2012-11-20 MED ORDER — ACETAMINOPHEN 325 MG PO TABS: 650.0000 mg | ORAL_TABLET | Freq: Once | ORAL | Status: DC

## 2012-11-20 MED ORDER — INSULIN ASPART 100 UNIT/ML ~~LOC~~ SOLN
0.0000 [IU] | Freq: Three times a day (TID) | SUBCUTANEOUS | Status: DC
  Administered 2012-11-21: 1 [IU] via SUBCUTANEOUS

## 2012-11-20 MED ORDER — IPRATROPIUM-ALBUTEROL 20-100 MCG/ACT IN AERS: 2.0000 | INHALATION_SPRAY | Freq: Four times a day (QID) | RESPIRATORY_TRACT | Status: DC | PRN

## 2012-11-20 MED ORDER — DEXTROSE 50 % IV SOLN: 25.0000 mL | Freq: Once | INTRAVENOUS | Status: AC | PRN

## 2012-11-20 MED ORDER — PIPERACILLIN-TAZOBACTAM 3.375 G IVPB
INTRAVENOUS | Status: AC
  Filled 2012-11-20: qty 50

## 2012-11-20 MED ORDER — ENOXAPARIN SODIUM 40 MG/0.4ML ~~LOC~~ SOLN
40.0000 mg | SUBCUTANEOUS | Status: DC
  Administered 2012-11-20 – 2012-11-24 (×5): 40 mg via SUBCUTANEOUS
  Filled 2012-11-20 (×5): qty 0.4

## 2012-11-20 NOTE — Progress Notes (Signed)
I was made aware today that pt is a diabetic and is ACHS at [facility]. NT checked CBG with evening CBG checks. Reading was "Low, <10" notified Dr. Elvera Lennox, hypoglycemia protocol initiated, 25ml of D50 given at 1648, CBG rechecked at 1709, reading was "Low, <10." The other 25ml of D50 were administered. CBG rechecked again at 1727, and result was 99. Dr. Elvera Lennox was made aware. Sheryn Bison

## 2012-11-20 NOTE — ED Notes (Signed)
Pt family requesting something for pain. Informed the family that the hospitalist wanted to examine the pt prior to ordering IV pain medication. Pt family refusing PO medication at this time, stating they want to wait until the hospitalist examines the pt.

## 2012-11-20 NOTE — Progress Notes (Signed)
Dr. Elvera Lennox is aware that we are unable to obtain an accurate pulse ox reading. Offered to obtain ABG if he felt it was necessary. Pt is resting comfortably and in no acute distress, so he states it isn't needed at this time. Sheryn Bison, RN

## 2012-11-20 NOTE — Progress Notes (Signed)
ANTIBIOTIC CONSULT NOTE  Pharmacy Consult for Vancomycin and Zosyn Indication: Leg infection w/ gangrene  No Known Allergies  Patient Measurements: Height: 5\' 2"  (157.5 cm) Weight: 90 lb 3.2 oz (40.914 kg) IBW/kg (Calculated) : 50.1  Vital Signs: Temp: 97.4 F (36.3 C) (08/03 0443) Temp src: Axillary (08/03 0443) BP: 96/69 mmHg (08/03 0443) Pulse Rate: 83 (08/03 0443) Intake/Output from previous day: 08/02 0701 - 08/03 0700 In: 362.5 [I.V.:362.5] Out: -  Intake/Output from this shift:    Labs:  Recent Labs  11/18/2012 2102 11/20/12 0530  WBC 5.9 5.6  HGB 12.6 11.8*  PLT 136* 122*  CREATININE 0.69 0.59   Estimated Creatinine Clearance: 30.8 ml/min (by C-G formula based on Cr of 0.59). No results found for this basename: VANCOTROUGH, Leodis Binet, VANCORANDOM, GENTTROUGH, GENTPEAK, GENTRANDOM, TOBRATROUGH, TOBRAPEAK, TOBRARND, AMIKACINPEAK, AMIKACINTROU, AMIKACIN,  in the last 72 hours   Microbiology: Recent Results (from the past 720 hour(s))  URINE CULTURE     Status: None   Collection Time    11/07/12  1:57 AM      Result Value Range Status   Specimen Description URINE, CLEAN CATCH   Final   Special Requests NONE   Final   Culture  Setup Time 11/07/2012 02:37   Final   Colony Count >=100,000 COLONIES/ML   Final   Culture ENTEROCOCCUS SPECIES   Final   Report Status 11/09/2012 FINAL   Final   Organism ID, Bacteria ENTEROCOCCUS SPECIES   Final  MRSA PCR SCREENING     Status: Abnormal   Collection Time    11/20/12  4:00 AM      Result Value Range Status   MRSA by PCR POSITIVE (*) NEGATIVE Final   Comment:            The GeneXpert MRSA Assay (FDA     approved for NASAL specimens     only), is one component of a     comprehensive MRSA colonization     surveillance program. It is not     intended to diagnose MRSA     infection nor to guide or     monitor treatment for     MRSA infections.     RESULT CALLED TO, READ BACK BY AND VERIFIED WITH:     THOMAS,K AT  0608 ON 11/20/12 BY MOSLEYJ    Medical History: Past Medical History  Diagnosis Date  . Coronary artery disease   . Ventricular tachycardia   . CHF (congestive heart failure)   . GERD (gastroesophageal reflux disease)   . Atrial flutter   . Sick sinus syndrome   . Dementia   . Diabetes mellitus   . Thrombocytopenia   . Right bundle branch block   . Hyperlipidemia   . TIA (transient ischemic attack)   . Arthritis   . ITP (idiopathic thrombocytopenic purpura) 07/14/2011    Chronic low grade idiopathic thrombocytopenia purpura still not in need of therapy.   Marland Kitchen URI (upper respiratory infection)     history  . Hx: UTI (urinary tract infection)   . Chronic kidney disease   . Pneumonia   . Acute MI   . Chronic diastolic heart failure   . Diseases of tricuspid valve   . Dysphagia   . Hypoxemia    Medications:  Scheduled:  . sodium chloride   Intravenous STAT  . aspirin EC  81 mg Oral Daily  . Chlorhexidine Gluconate Cloth  6 each Topical Q0600  . enoxaparin  40 mg Subcutaneous  Q24H  . isosorbide mononitrate  30 mg Oral Daily  . mupirocin ointment  1 application Nasal BID  . piperacillin-tazobactam (ZOSYN)  IV  3.375 g Intravenous Q8H  . simvastatin  20 mg Oral QHS   Assessment: Okay for Protocol Estimated Creatinine Clearance: 30.8 ml/min (by C-G formula based on Cr of 0.59). Patient received 750mg  IV Vancomycin earlier today.  Vancomycin 8/3 >> Zosyn 8 >>  Goal of Therapy:  Vancomycin trough level 15-20 mcg/ml  Plan:  Zosyn 3.375gm IV every 8 hours. Follow-up micro data, labs, vitals. Vancomycin 500mg  IV every 24 hours. Measure antibiotic drug levels at steady state Follow up culture results  Mady Gemma 11/20/2012,7:50 AM

## 2012-11-20 NOTE — Progress Notes (Signed)
Notified Dr. Elvera Lennox that I was uncomfortable administered pts po ASA or Imdur this morning due to her very drowsy state. Family is requesting that pt have pureed diet with nectar thick liquids which she takes at [facility]. Dr. Elvera Lennox is aware of this and states that the diet correlates with her previous swallow evaluation. He states he plans to allow comfort feedings for pt. [name]

## 2012-11-20 NOTE — Progress Notes (Signed)
ANTICOAGULATION CONSULT NOTE - Initial Consult  Pharmacy Consult for Lovenox Indication: DVT hx DVT (6/14)  No Known Allergies  Patient Measurements: Height: 5\' 2"  (157.5 cm) Weight: 90 lb 3.2 oz (40.914 kg) IBW/kg (Calculated) : 50.1  Vital Signs: Temp: 97.4 F (36.3 C) (08/03 0443) Temp src: Axillary (08/03 0443) BP: 96/69 mmHg (08/03 0443) Pulse Rate: 83 (08/03 0443)  Labs:  Recent Labs  12/06/2012 2102 11/20/12 0530  HGB 12.6 11.8*  HCT 39.7 37.9  PLT 136* 122*  LABPROT 13.3 14.2  INR 1.03 1.12  CREATININE 0.69 0.59  TROPONINI <0.30  --     Estimated Creatinine Clearance: 30.8 ml/min (by C-G formula based on Cr of 0.59).   Medical History: Past Medical History  Diagnosis Date  . Coronary artery disease   . Ventricular tachycardia   . CHF (congestive heart failure)   . GERD (gastroesophageal reflux disease)   . Atrial flutter   . Sick sinus syndrome   . Dementia   . Diabetes mellitus   . Thrombocytopenia   . Right bundle branch block   . Hyperlipidemia   . TIA (transient ischemic attack)   . Arthritis   . ITP (idiopathic thrombocytopenic purpura) 07/14/2011    Chronic low grade idiopathic thrombocytopenia purpura still not in need of therapy.   Marland Kitchen URI (upper respiratory infection)     history  . Hx: UTI (urinary tract infection)   . Chronic kidney disease   . Pneumonia   . Acute MI   . Chronic diastolic heart failure   . Diseases of tricuspid valve   . Dysphagia   . Hypoxemia     Medications:  Scheduled:  . sodium chloride   Intravenous STAT  . aspirin EC  81 mg Oral Daily  . Chlorhexidine Gluconate Cloth  6 each Topical Q0600  . enoxaparin  40 mg Subcutaneous Q24H  . isosorbide mononitrate  30 mg Oral Daily  . mupirocin ointment  1 application Nasal BID  . piperacillin-tazobactam (ZOSYN)  IV  3.375 g Intravenous Q8H  . simvastatin  20 mg Oral QHS  . [START ON 11/21/2012] vancomycin  500 mg Intravenous Q24H    Assessment: Okay for  Protocol Estimated Creatinine Clearance: 30.8 ml/min (by C-G formula based on Cr of 0.59). Patient with Hx DVT apparently on warfarin at nursing home, however admission INR is at baseline.  Goal of Therapy:  Anti-Xa level 0.6-1.2 units/ml 4hrs after LMWH dose given if checked. Monitor platelets by anticoagulation protocol: Yes   Plan:  Lovenox 40mg  SQ daily which is treatment dose for this patient with a weight of 40kg and creatinine clearance of approximately 38ml/min. Monitor CBC. F/U anticoag plans.  [name] R 11/20/2012,7:58 AM

## 2012-11-20 NOTE — Progress Notes (Signed)
ANTIBIOTIC CONSULT NOTE-Preliminary  Pharmacy Consult for Vancomycin and Zosyn Indication: Infected right leg, R/O sepsis  No Known Allergies  Patient Measurements: Height: 5\' 2"  (157.5 cm) Weight: 90 lb 3.2 oz (40.914 kg) IBW/kg (Calculated) : 50.1   Vital Signs: Temp: 97.7 F (36.5 C) (08/03 0148) Temp src: Axillary (08/03 0148) BP: 89/63 mmHg (08/03 0148) Pulse Rate: 82 (08/03 0148)  Labs:  Recent Labs  12/02/2012 2102  WBC 5.9  HGB 12.6  PLT 136*  CREATININE 0.69    Estimated Creatinine Clearance: 30.8 ml/min (by C-G formula based on Cr of 0.69).  No results found for this basename: VANCOTROUGH, Leodis Binet, VANCORANDOM, GENTTROUGH, GENTPEAK, GENTRANDOM, TOBRATROUGH, TOBRAPEAK, TOBRARND, AMIKACINPEAK, AMIKACINTROU, AMIKACIN,  in the last 72 hours   Microbiology: Recent Results (from the past 720 hour(s))  URINE CULTURE     Status: None   Collection Time    11/07/12  1:57 AM      Result Value Range Status   Specimen Description URINE, CLEAN CATCH   Final   Special Requests NONE   Final   Culture  Setup Time 11/07/2012 02:37   Final   Colony Count >=100,000 COLONIES/ML   Final   Culture ENTEROCOCCUS SPECIES   Final   Report Status 11/09/2012 FINAL   Final   Organism ID, Bacteria ENTEROCOCCUS SPECIES   Final    Medical History: Past Medical History  Diagnosis Date  . Coronary artery disease   . Ventricular tachycardia   . CHF (congestive heart failure)   . GERD (gastroesophageal reflux disease)   . Atrial flutter   . Sick sinus syndrome   . Dementia   . Diabetes mellitus   . Thrombocytopenia   . Right bundle branch block   . Hyperlipidemia   . TIA (transient ischemic attack)   . Arthritis   . ITP (idiopathic thrombocytopenic purpura) 07/14/2011    Chronic low grade idiopathic thrombocytopenia purpura still not in need of therapy.   Marland Kitchen URI (upper respiratory infection)     history  . Hx: UTI (urinary tract infection)   . Chronic kidney disease   .  Pneumonia   . Acute MI   . Chronic diastolic heart failure   . Diseases of tricuspid valve   . Dysphagia   . Hypoxemia     Medications:   Assessment: 79 yo female admitted from nursing home with altered mental status and SOB; PMH sig for atherosclerotic vascular disease with gangrenous right leg and possible evolving sepsis.   Goal of Therapy:  Vancomycin toughs 15-20 mcg/ml Eradication of infection  Plan:  Preliminary review of pertinent patient information completed.  Protocol will be initiated with one-time doses of Vancomycin 750 mg IV and Zosyn 3.375 Gm IV.  Jeani Hawking clinical pharmacist will complete review during morning rounds to assess patient and finalize treatment regimen.  Arelia Sneddon, [facility] 11/20/2012,2:55 AM

## 2012-11-20 NOTE — H&P (Signed)
PCP:   Cassell Smiles., MD   Chief Complaint:   HPI: *79 year old female with a history of CHF, dementia, atrial flutter, blurred, CAD, low-grade ITP who was recently discharged from home hospital after patient was treated conservatively for acute right leg arterial embolism, patient was deemed not a surgical candidate. And palliative care was involved. Patient was made DO NOT RESUSCITATE and patient was transferred to nursing home. Patient also was started on anticoagulation with Lovenox and Coumadin as per family request. Today patient was brought to the ED from nursing home for dehydration and altered mental status. Patient is unable to provide any history, so all the history is obtained from the medical records as well as patient's daughter. No history of fever, no nausea vomiting. Patient is found to be hypotensive along with altered mental status   Allergies:  No Known Allergies    Past Medical History  Diagnosis Date  . Coronary artery disease   . Ventricular tachycardia   . CHF (congestive heart failure)   . GERD (gastroesophageal reflux disease)   . Atrial flutter   . Sick sinus syndrome   . Dementia   . Diabetes mellitus   . Thrombocytopenia   . Right bundle branch block   . Hyperlipidemia   . TIA (transient ischemic attack)   . Arthritis   . ITP (idiopathic thrombocytopenic purpura) 07/14/2011    Chronic low grade idiopathic thrombocytopenia purpura still not in need of therapy.   Marland Kitchen URI (upper respiratory infection)     history  . Hx: UTI (urinary tract infection)   . Chronic kidney disease   . Pneumonia   . Acute MI   . Chronic diastolic heart failure   . Diseases of tricuspid valve   . Dysphagia   . Hypoxemia     Past Surgical History  Procedure Laterality Date  . Coronary angioplasty with stent placement    . Biopsy thyroid      Prior to Admission medications   Medication Sig Start Date End Date Taking? Authorizing Provider  albuterol-ipratropium  (COMBIVENT) 18-103 MCG/ACT inhaler Inhale 2 puffs into the lungs every 6 (six) hours as needed for wheezing.    Historical Provider, MD  aspirin 81 MG tablet Take 81 mg by mouth every morning.     Historical Provider, MD  carvedilol (COREG) 3.125 MG tablet Take 1 tablet (3.125 mg total) by mouth 2 (two) times daily with a meal. 10/06/12   Leroy Sea, MD  co-enzyme Q-10 50 MG capsule Take 300 mg by mouth daily.    Historical Provider, MD  enoxaparin (LOVENOX) 40 MG/0.4ML injection Inject 0.4 mLs (40 mg total) into the skin daily. Stop once the INR is 2 10/06/12   Leroy Sea, MD  guaiFENesin (MUCINEX) 600 MG 12 hr tablet Take 600 mg by mouth every morning.      Historical Provider, MD  HYDROcodone-acetaminophen (NORCO/VICODIN) 5-325 MG per tablet Take 1 tablet by mouth at bedtime. 10/06/12   Leroy Sea, MD  isosorbide mononitrate (IMDUR) 60 MG 24 hr tablet Take 30 mg by mouth daily.     Historical Provider, MD  Multiple Vitamins-Iron (MULTIVITAMIN/IRON) TABS Take 1 tablet by mouth every morning.      Historical Provider, MD  nitroGLYCERIN (NITROSTAT) 0.4 MG SL tablet Place 0.4 mg under the tongue every 5 (five) minutes as needed.    Historical Provider, MD  pantoprazole (PROTONIX) 40 MG tablet Take 40 mg by mouth every morning.     Historical Provider,  MD  potassium chloride SA (K-DUR,KLOR-CON) 20 MEQ tablet Take 20 mEq by mouth every morning.     Historical Provider, MD  simvastatin (ZOCOR) 20 MG tablet Take 20 mg by mouth at bedtime.     Historical Provider, MD  solifenacin (VESICARE) 5 MG tablet Take 5 mg by mouth every morning.     Historical Provider, MD  warfarin (COUMADIN) 2 MG tablet Take 2.5 mg by mouth daily at 6 PM. 10/06/12   Leroy Sea, MD    Social History:  reports that she has never smoked. She has never used smokeless tobacco. She reports that she does not drink alcohol or use illicit drugs.      Review of Systems:  Not obtainable at this time as patient is  an alternative status   Physical Exam: Blood pressure 89/63, pulse 82, temperature 97.7 F (36.5 C), temperature source Axillary, resp. rate 16, weight 40.914 kg (90 lb 3.2 oz), SpO2 95.00%. Constitutional:   Patient is a cachectic appearing female in no acute distress  Head: Normocephalic and atraumatic Mouth: Mucus membranes dry Neck: Supple, No Thyromegaly Cardiovascular: RRR, S1 normal, S2 normal Pulmonary/Chest: CTAB, no wheezes, rales, or rhonchi Abdominal: Soft. Non-tender, non-distended, bowel sounds are normal, no masses, organomegaly, or guarding present.  Neurological: Obtunded , neurological examination could not be completed due to patient's altered mental status   Extremities : Gangrene involving the right foot and right leg extending up to the knee. Sloughing noted in the gangrenous leg.   Labs on Admission:  Results for orders placed during the hospital encounter of 11/23/2012 (from the past 48 hour(s))  CBC WITH DIFFERENTIAL     Status: Abnormal   Collection Time    November 23, 2012  9:02 PM      Result Value Range   WBC 5.9  4.0 - 10.5 K/uL   RBC 4.27  3.87 - 5.11 MIL/uL   Hemoglobin 12.6  12.0 - 15.0 g/dL   HCT 16.1  09.6 - 04.5 %   MCV 93.0  78.0 - 100.0 fL   MCH 29.5  26.0 - 34.0 pg   MCHC 31.7  30.0 - 36.0 g/dL   RDW 40.9 (*) 81.1 - 91.4 %   Platelets 136 (*) 150 - 400 K/uL   Neutrophils Relative % 64  43 - 77 %   Neutro Abs 3.7  1.7 - 7.7 K/uL   Lymphocytes Relative 25  12 - 46 %   Lymphs Abs 1.5  0.7 - 4.0 K/uL   Monocytes Relative 11  3 - 12 %   Monocytes Absolute 0.7  0.1 - 1.0 K/uL   Eosinophils Relative 0  0 - 5 %   Eosinophils Absolute 0.0  0.0 - 0.7 K/uL   Basophils Relative 0  0 - 1 %   Basophils Absolute 0.0  0.0 - 0.1 K/uL  BASIC METABOLIC PANEL     Status: Abnormal   Collection Time    11-23-12  9:02 PM      Result Value Range   Sodium 143  135 - 145 mEq/L   Potassium 4.5  3.5 - 5.1 mEq/L   Chloride 109  96 - 112 mEq/L   CO2 22  19 - 32 mEq/L    Glucose, Bld 81  70 - 99 mg/dL   BUN 27 (*) 6 - 23 mg/dL   Creatinine, Ser 7.82  0.50 - 1.10 mg/dL   Calcium 95.6  8.4 - 21.3 mg/dL   GFR calc non Af Denyse Dago  75 (*) >90 mL/min   GFR calc Af Amer 87 (*) >90 mL/min   Comment:            The eGFR has been calculated     using the CKD EPI equation.     This calculation has not been     validated in all clinical     situations.     eGFR's persistently     <90 mL/min signify     possible Chronic Kidney Disease.  LACTIC ACID, PLASMA     Status: None   Collection Time    11/25/2012  9:02 PM      Result Value Range   Lactic Acid, Venous 1.5  0.5 - 2.2 mmol/L  TROPONIN I     Status: None   Collection Time    11/24/2012  9:02 PM      Result Value Range   Troponin I <0.30  <0.30 ng/mL   Comment:            Due to the release kinetics of cTnI,     a negative result within the first hours     of the onset of symptoms does not rule out     myocardial infarction with certainty.     If myocardial infarction is still suspected,     repeat the test at appropriate intervals.  PROTIME-INR     Status: None   Collection Time    12/15/2012  9:02 PM      Result Value Range   Prothrombin Time 13.3  11.6 - 15.2 seconds   INR 1.03  0.00 - 1.49  URINALYSIS, ROUTINE W REFLEX MICROSCOPIC     Status: Abnormal   Collection Time    12/14/2012 10:25 PM      Result Value Range   Color, Urine YELLOW  YELLOW   APPearance CLEAR  CLEAR   Specific Gravity, Urine >1.030 (*) 1.005 - 1.030   pH 5.5  5.0 - 8.0   Glucose, UA NEGATIVE  NEGATIVE mg/dL   Hgb urine dipstick NEGATIVE  NEGATIVE   Bilirubin Urine SMALL (*) NEGATIVE   Ketones, ur TRACE (*) NEGATIVE mg/dL   Protein, ur NEGATIVE  NEGATIVE mg/dL   Urobilinogen, UA 0.2  0.0 - 1.0 mg/dL   Nitrite NEGATIVE  NEGATIVE   Leukocytes, UA NEGATIVE  NEGATIVE   Comment: MICROSCOPIC NOT DONE ON URINES WITH NEGATIVE PROTEIN, BLOOD, LEUKOCYTES, NITRITE, OR GLUCOSE <1000 mg/dL.    Radiological Exams on Admission: Dg  Chest Port 1 View  11/26/2012   *RADIOLOGY REPORT*  Clinical Data: 79 year old female with shortness of breath and altered mental status.  PORTABLE CHEST - 1 VIEW  Comparison: 11/07/2012 and prior chest radiographs  Findings: Cardiomegaly again noted. The left retrocardiac region is difficult to evaluate but has a similar appearance to prior studies. Fullness of superior mediastinum is unchanged. High density within the right lower lung is compatible with prior aspiration. There is no evidence of focal airspace disease, pulmonary edema, suspicious pulmonary nodule/mass, pleural effusion, or pneumothorax. No acute bony abnormalities are identified.  IMPRESSION: Cardiomegaly without definite evidence of acute cardiopulmonary disease.   Original Report Authenticated By: Harmon Pier, M.D.    Assessment/Plan Active Problems:   Coronary artery disease   Dementia   Atherosclerotic peripheral vascular disease with gangrene  Altered mental status Patient's altered mental status is probably due to infected leg, gangrene. We'll start the patient on vancomycin and Zosyn per pharmacy consult  Acute right leg ischemia with  gangrene Consult management was offered by a vascular surgeon due to patient's multiple medical problems with advanced age she was deemed not a surgical candidate. Continued Lovenox and Coumadin as per family's request  Hypotension Patient's blood pressure is low which could be due to developing sepsis We'll continue the IV fluids  Code status: DO NOT RESUSCITATE  Family discussion: Discussed in detail with patient's daughter regarding patient's poor prognosis and very low chances of survival. They still want medical management but agree with DO NOT RESUSCITATE. We'll continue to provide medical care to agree that patient is very close to her death due to infected right leg with gangrene.   Time Spent on Admission: 60 min  Vere Diantonio S [facility] Pager: [phone] 11/20/2012,  2:27 AM  If 7PM-7AM, please contact night-coverage  [email or web address]  Password [email or web address]

## 2012-11-20 NOTE — Progress Notes (Signed)
[facility] PROGRESS NOTE  MCKINZEY ENTWISTLE ZOX:[id number] DOB: 11-Nov-1921 DOA: 12/15/2012 PCP: Cassell Smiles., MD  HPI: 79 year old female with a history of CHF, dementia, atrial flutter, blurred, CAD, low-grade ITP who was recently discharged from home hospital after patient was treated conservatively for acute right leg arterial embolism, patient was deemed not a surgical candidate. And palliative care was involved. Patient was made DO NOT RESUSCITATE and patient was transferred to nursing home. Patient also was started on anticoagulation with Lovenox and Coumadin as per family request. Today patient was brought to the ED from nursing home for dehydration and altered mental status. Patient is unable to provide any history, so all the history is obtained from the medical records as well as patient's daughter.  No history of fever, no nausea vomiting. Patient is found to be hypotensive along with altered mental status  Assessment/Plan:  Goals of care - I had a lengthy discussion today with patient's daughter. Patient has a very poor prognosis, she has advanced dementia, and a non-operable right infected gangrenous foot. It seems like the family has a very difficult time letting go. Her daughter avoids answering when I asked her what would the patient want for herself in this instance, as this should be the main guidance for care rather than the family's personal wishes. Her daughter is somewhat disconnected with the overall condition of the patient, and seems concerned that if the patient is not currently able to take oral medications including her heart medications, "what if she has a heart attack?". For now, we'll continue medical management with antibiotics, comfort feeding per previous swallow evaluations, and focus as well on patient's comfort.  Patient's altered mental status is probably due to infected leg, gangrene.  - Continue vancomycin and Zosyn; if she will be able to have some po  intake, would favor using oral abx. Re-assess tomorrow.  Acute right leg ischemia with gangrene - Consult management was offered by a vascular surgeon due to patient's multiple medical problems with advanced age she was deemed not a surgical candidate.  Continued Lovenox; restart coumadin once able to tolerate better po.  Hypotension - Patient's blood pressure is low which could be due to developing sepsis  We'll continue the IV fluids    Code Status: DO NOT RESUSCITATE Family Communication: Daughter bedside  Disposition Plan: Unknown at this time  Consultants:  None  Procedures:  None  Anti-infectives   Start     Dose/Rate Route Frequency Ordered Stop   11/21/12 0600  vancomycin (VANCOCIN) 500 mg in sodium chloride 0.9 % 100 mL IVPB     500 mg 100 mL/hr over 60 Minutes Intravenous Every 24 hours 11/20/12 0758     11/20/12 0300  piperacillin-tazobactam (ZOSYN) IVPB 3.375 g     3.375 g 12.5 mL/hr over 240 Minutes Intravenous Every 8 hours 11/20/12 0254     11/20/12 0300  vancomycin (VANCOCIN) IVPB 750 mg/150 ml premix     750 mg 150 mL/hr over 60 Minutes Intravenous  Once 11/20/12 0254 11/20/12 0512     Antibiotics Given (last 72 hours)   Date/Time Action Medication Dose Rate   11/20/12 0412 Given   piperacillin-tazobactam (ZOSYN) IVPB 3.375 g 3.375 g 12.5 mL/hr   11/20/12 0412 Given   vancomycin (VANCOCIN) IVPB 750 mg/150 ml premix 750 mg 150 mL/hr   11/20/12 1120 Given   piperacillin-tazobactam (ZOSYN) IVPB 3.375 g 3.375 g 12.5 mL/hr      HPI/Subjective: - Patient is nonverbal, minimally responsive to her  name  Objective: Filed Vitals:   11/20/12 0006 11/20/12 0100 11/20/12 0148 11/20/12 0443  BP: 99/61 106/64 89/63 96/69   Pulse: 115 73 82 83  Temp:   97.7 F (36.5 C) 97.4 F (36.3 C)  TempSrc:   Axillary Axillary  Resp: 18  16 16   Height:   5\' 2"  (1.575 m)   Weight:   40.914 kg (90 lb 3.2 oz)   SpO2: 95%       Intake/Output Summary (Last 24 hours) at  11/20/12 1141 Last data filed at 11/20/12 0300  Gross per 24 hour  Intake  362.5 ml  Output      0 ml  Net  362.5 ml   Filed Weights   10-Dec-2012 2030 11/20/12 0148  Weight: 39.009 kg (86 lb) 40.914 kg (90 lb 3.2 oz)    Exam:  General:  No apparent distress, poorly responsive; very cachectic  Cardiovascular: regular rate and rhythm, without MRG  Respiratory: good air movement, clear to auscultation throughout, no wheezing, ronchi or rales  Abdomen: soft, not tender to palpation, positive bowel sounds  MSK: Wet gangrenous right foot. Dressing intact, without bleeding.  Neuro: obtunded  Data Reviewed: Basic Metabolic Panel:  Recent Labs Lab 12/10/2012 2102 11/20/12 0530  NA 143 144  K 4.5 4.0  CL 109 111  CO2 22 21  GLUCOSE 81 83  BUN 27* 24*  CREATININE 0.69 0.59  CALCIUM 10.2 9.3   Liver Function Tests:  Recent Labs Lab 11/20/12 0530  AST 12  ALT 6  ALKPHOS 60  BILITOT 0.3  PROT 5.7*  ALBUMIN 1.9*   CBC:  Recent Labs Lab December 10, 2012 2102 11/20/12 0530  WBC 5.9 5.6  NEUTROABS 3.7  --   HGB 12.6 11.8*  HCT 39.7 37.9  MCV 93.0 94.0  PLT 136* 122*   Cardiac Enzymes:  Recent Labs Lab December 10, 2012 2102  TROPONINI <0.30   BNP (last 3 results)  Recent Labs  09/16/12 1342 09/23/12 1545 09/27/12 1522  PROBNP 3786.0* 2560.0* 3230.0*   Recent Results (from the past 240 hour(s))  MRSA PCR SCREENING     Status: Abnormal   Collection Time    11/20/12  4:00 AM      Result Value Range Status   MRSA by PCR POSITIVE (*) NEGATIVE Final   Comment:            The GeneXpert MRSA Assay (FDA     approved for NASAL specimens     only), is one component of a     comprehensive MRSA colonization     surveillance program. It is not     intended to diagnose MRSA     infection nor to guide or     monitor treatment for     MRSA infections.     RESULT CALLED TO, READ BACK BY AND VERIFIED WITH:     THOMAS,K AT 0608 ON 11/20/12 BY MOSLEYJ     Studies: Dg Chest  [place] 1 View  Dec 10, 2012   *RADIOLOGY REPORT*  Clinical Data: 79 year old female with shortness of breath and altered mental status.  PORTABLE CHEST - 1 VIEW  Comparison: 11/07/2012 and prior chest radiographs  Findings: Cardiomegaly again noted. The left retrocardiac region is difficult to evaluate but has a similar appearance to prior studies. Fullness of superior mediastinum is unchanged. High density within the right lower lung is compatible with prior aspiration. There is no evidence of focal airspace disease, pulmonary edema, suspicious pulmonary nodule/mass, pleural effusion, or pneumothorax.  No acute bony abnormalities are identified.  IMPRESSION: Cardiomegaly without definite evidence of acute cardiopulmonary disease.   Original Report Authenticated By: Harmon Pier, M.D.    Scheduled Meds: . sodium chloride   Intravenous STAT  . aspirin EC  81 mg Oral Daily  . Chlorhexidine Gluconate Cloth  6 each Topical Q0600  . enoxaparin  40 mg Subcutaneous Q24H  . isosorbide mononitrate  30 mg Oral Daily  . mupirocin ointment  1 application Nasal BID  . piperacillin-tazobactam (ZOSYN)  IV  3.375 g Intravenous Q8H  . simvastatin  20 mg Oral QHS  . [START ON 11/21/2012] vancomycin  500 mg Intravenous Q24H   Continuous Infusions: . sodium chloride 75 mL/hr at 11/20/12 1610    Active Problems:   Coronary artery disease   Dementia   Atherosclerotic peripheral vascular disease with gangrene  Time spent: 45  Pamella Pert, MD [facility] Pager [phone]. If 7 PM - 7 AM, please contact night-coverage at [email or web address], password [facility] 11/20/2012, 11:41 AM  LOS: 1 day

## 2012-11-21 ENCOUNTER — Inpatient Hospital Stay (HOSPITAL_COMMUNITY): Payer: Medicare Other

## 2012-11-21 DIAGNOSIS — F0390 Unspecified dementia without behavioral disturbance: Secondary | ICD-10-CM

## 2012-11-21 DIAGNOSIS — I96 Gangrene, not elsewhere classified: Secondary | ICD-10-CM

## 2012-11-21 LAB — BASIC METABOLIC PANEL
CO2: 25 mEq/L (ref 19–32)
Calcium: 9.6 mg/dL (ref 8.4–10.5)
GFR calc non Af Amer: 61 mL/min — ABNORMAL LOW (ref >90)
Glucose, Bld: 189 mg/dL — ABNORMAL HIGH (ref 70–99)
Potassium: 3.8 mEq/L (ref 3.5–5.1)
Sodium: 146 mEq/L — ABNORMAL HIGH (ref 135–145)

## 2012-11-21 LAB — CBC
Hemoglobin: 12.7 g/dL (ref 12.0–15.0)
MCH: 29.5 pg (ref 26.0–34.0)
Platelets: 127 10*3/uL — ABNORMAL LOW (ref 150–400)
RBC: 4.3 MIL/uL (ref 3.87–5.11)
WBC: 5.4 10*3/uL (ref 4.0–10.5)

## 2012-11-21 LAB — GLUCOSE, CAPILLARY
Glucose-Capillary: 105 mg/dL — ABNORMAL HIGH (ref 70–99)
Glucose-Capillary: 56 mg/dL — ABNORMAL LOW (ref 70–99)
Glucose-Capillary: 126 mg/dL — ABNORMAL HIGH (ref 70–99)
Glucose-Capillary: 125 mg/dL — ABNORMAL HIGH (ref 70–99)

## 2012-11-21 MED ORDER — DEXTROSE 50 % IV SOLN: 25.0000 mL | Freq: Once | INTRAVENOUS | Status: AC | PRN

## 2012-11-21 MED ORDER — DEXTROSE 50 % IV SOLN
INTRAVENOUS | Status: AC
  Filled 2012-11-21: qty 50

## 2012-11-21 NOTE — Progress Notes (Signed)
INITIAL NUTRITION ASSESSMENT  DOCUMENTATION CODES Per approved criteria  -Underweight   INTERVENTION: RD will follow care progression and diet advancement and add oral nutrition supplements.  NUTRITION DIAGNOSIS: Inadequate oral intake related to swallow difficulty, Chronic disease progression as evidenced by gangrene of right foot, dysphagia and dementia   Goal: Meet nutrition needs as able  Monitor:  Po intake, labs and wt trends   Reason for Assessment: Malnutrition Screen score = 4   79 y.o. female   Admitting Dx: dehydration, altered mental status  ASSESSMENT: Pt admitted from [facility] dehydrated, with altered mental status. Poor prognosis noted. She is currently DNR, NPO status and has hx of swallow difficulty, advanced dementia and multiple areas of skin breakdown including gangrene to right foot. PICC line being placed for antibiotics.  MD has discussed goals of care with daughter. Will follow care progression and provide comfort feeds per MD. Daughter says pt was receiving Pureed diet with Nectar-thick liquids PTA. She reports pt ate a few bites on Friday (8/1) but has been mostly sleeping since and not eating. Severe wt loss noted with multiple contributing factors including comfort feeding goals with inadequate oral intake, recent acute on chronic CHF(June hospitalization), variability of weight scale readings. Suspect pt is malnourished given her increased protein needs and poor oral intake. Will continue to follow her nutrition care. Unable to complete nutrition focused physical exam at this time.    Height: Ht Readings from Last 1 Encounters:  11/20/12 5\' 2"  (1.575 m)    Weight: Wt Readings from Last 1 Encounters:  11/20/12 90 lb 3.2 oz (40.914 kg)    Ideal Body Weight: 110#  % Ideal Body Weight: 82%  Wt Readings from Last 10 Encounters:  11/20/12 90 lb 3.2 oz (40.914 kg)  10/06/12 86 lb 6.7 oz (39.2 kg)  09/23/12 112 lb (50.803 kg)  09/20/12 112 lb  (50.803 kg)  04/19/12 110 lb (49.896 kg)  01/18/12 112 lb (50.803 kg)  07/14/11 106 lb (48.081 kg)    Usual Body Weight: 105-110#  % Usual Body Weight: 86%  BMI:  Body mass index is 16.49 kg/(m^2).underweight  Estimated Nutritional Needs: Kcal: 1435-1640 Protein: 62-78 gr Fluid: 1200-1400 ml/day  Skin: unstageable areas to right lower leg and foot, stage II to sacrum and buttocks  Diet Order: NPO  EDUCATION NEEDS: -No education needs identified at this time   Intake/Output Summary (Last 24 hours) at 11/21/12 1059 Last data filed at 11/21/12 0427  Gross per 24 hour  Intake 1067.5 ml  Output      0 ml  Net 1067.5 ml    Last BM:  PTA  Labs:   Recent Labs Lab 12/15/12 2102 11/20/12 0530 11/21/12 0537  NA 143 144 146*  K 4.5 4.0 3.8  CL 109 111 113*  CO2 22 21 25   BUN 27* 24* 19  CREATININE 0.69 0.59 0.83  CALCIUM 10.2 9.3 9.6  GLUCOSE 81 83 189*    CBG (last 3)   Recent Labs  11/20/12 1727 11/20/12 2031 11/21/12 0735  GLUCAP 99 95 56*    Scheduled Meds: . aspirin EC  81 mg Oral Daily  . Chlorhexidine Gluconate Cloth  6 each Topical Q0600  . enoxaparin  40 mg Subcutaneous Q24H  . insulin aspart  0-9 Units Subcutaneous TID WC  . isosorbide mononitrate  30 mg Oral Daily  . mupirocin ointment  1 application Nasal BID  . piperacillin-tazobactam (ZOSYN)  IV  3.375 g Intravenous Q8H  . simvastatin  20 mg Oral QHS  . vancomycin  500 mg Intravenous Q24H    Continuous Infusions: . dextrose 5 % and 0.45% NaCl 75 mL/hr at 11/21/12 6578    Past Medical History  Diagnosis Date  . Coronary artery disease   . Ventricular tachycardia   . CHF (congestive heart failure)   . GERD (gastroesophageal reflux disease)   . Atrial flutter   . Sick sinus syndrome   . Dementia   . Diabetes mellitus   . Thrombocytopenia   . Right bundle branch block   . Hyperlipidemia   . TIA (transient ischemic attack)   . Arthritis   . ITP (idiopathic thrombocytopenic  purpura) 07/14/2011    Chronic low grade idiopathic thrombocytopenia purpura still not in need of therapy.   Marland Kitchen URI (upper respiratory infection)     history  . Hx: UTI (urinary tract infection)   . Chronic kidney disease   . Pneumonia   . Acute MI   . Chronic diastolic heart failure   . Diseases of tricuspid valve   . Dysphagia   . Hypoxemia     Past Surgical History  Procedure Laterality Date  . Coronary angioplasty with stent placement    . Biopsy thyroid      Royann Shivers MS,RD,LDN,CSG Office: #[phone] Pager: [phone]

## 2012-11-21 NOTE — Progress Notes (Signed)
[facility] PROGRESS NOTE  [name] RUE:[id number] DOB: [birth year] DOA: 11/27/2012 PCP: Cassell Smiles., MD  HPI: 79 year old female with a history of CHF, dementia, atrial flutter, blurred, CAD, low-grade ITP who was recently discharged from home hospital after patient was treated conservatively for acute right leg arterial embolism, patient was deemed not a surgical candidate. And palliative care was involved. Patient was made DO NOT RESUSCITATE and patient was transferred to nursing home. Patient also was started on anticoagulation with Lovenox and Coumadin as per family request. Today patient was brought to the ED from nursing home for dehydration and altered mental status. Patient is unable to provide any history, so all the history is obtained from the medical records as well as patient's daughter.  No history of fever, no nausea vomiting. Patient is found to be hypotensive along with altered mental status  Assessment/Plan:  Goals of care - I had a lengthy discussion yesterday with patient's daughter. Patient has a very poor prognosis, she has advanced dementia, and a non-operable right infected gangrenous foot. It seems like the family has a very difficult time letting go. Her daughter avoids answering when I asked her what would the patient want for herself in this instance, as this should be the main guidance for care rather than the family's personal wishes. Her daughter is somewhat disconnected with the overall condition of the patient, and seems concerned that if the patient is not currently able to take oral medications including her heart medications, "what if she has a heart attack?". For now, we'll continue medical management with antibiotics, comfort feeding per previous swallow evaluations, and focus as well on patient's comfort. - today patient seems unchanged for me but family states that she is more alert. Will attempt at discussing again goals of care, but may need to do  PICC line and IV antibiotics for a 1-2 week course at her SNF. Patient's altered mental status is probably due to infected leg, gangrene, advancing dementia.  - Continue vancomycin and Zosyn; if she will be able to have some po intake, would favor using oral abx. Acute right leg ischemia with gangrene - Consult management was offered by a vascular surgeon due to patient's multiple medical problems with advanced age she was deemed not a surgical candidate. Continued Lovenox; restart coumadin once able to tolerate better po.  Hypotension - supportive hydration. We'll continue the IV fluids    Code Status: DO NOT RESUSCITATE Family Communication: Daughter bedside  Disposition Plan: SNF 1-2 days  Consultants:  None  Procedures:  None  Anti-infectives   Start     Dose/Rate Route Frequency Ordered Stop   11/21/12 0600  vancomycin (VANCOCIN) 500 mg in sodium chloride 0.9 % 100 mL IVPB     500 mg 100 mL/hr over 60 Minutes Intravenous Every 24 hours 11/20/12 0758     11/20/12 0300  piperacillin-tazobactam (ZOSYN) IVPB 3.375 g     3.375 g 12.5 mL/hr over 240 Minutes Intravenous Every 8 hours 11/20/12 0254     11/20/12 0300  vancomycin (VANCOCIN) IVPB 750 mg/150 ml premix     750 mg 150 mL/hr over 60 Minutes Intravenous  Once 11/20/12 0254 11/20/12 0512     Antibiotics Given (last 72 hours)   Date/Time Action Medication Dose Rate   11/20/12 0412 Given   piperacillin-tazobactam (ZOSYN) IVPB 3.375 g 3.375 g 12.5 mL/hr   11/20/12 0412 Given   vancomycin (VANCOCIN) IVPB 750 mg/150 ml premix 750 mg 150 mL/hr   11/20/12 1120  Given   piperacillin-tazobactam (ZOSYN) IVPB 3.375 g 3.375 g 12.5 mL/hr   11/20/12 2042 Given   piperacillin-tazobactam (ZOSYN) IVPB 3.375 g 3.375 g 12.5 mL/hr   11/21/12 0359 Given   piperacillin-tazobactam (ZOSYN) IVPB 3.375 g 3.375 g 12.5 mL/hr   11/21/12 0521 Given   vancomycin (VANCOCIN) 500 mg in sodium chloride 0.9 % 100 mL IVPB 500 mg 100 mL/hr       HPI/Subjective: - Patient is nonverbal  Objective: Filed Vitals:   11/20/12 0443 11/20/12 1300 11/20/12 2037 11/21/12 0527  BP: 96/69 102/64 102/68 93/56  Pulse: 83  121 116  Temp: 97.4 F (36.3 C) 98.4 F (36.9 C) 97.3 F (36.3 C) 97.5 F (36.4 C)  TempSrc: Axillary Oral Oral   Resp: 16 16 16 16   Height:      Weight:      SpO2:        Intake/Output Summary (Last 24 hours) at 11/21/12 0945 Last data filed at 11/21/12 0427  Gross per 24 hour  Intake 1067.5 ml  Output      0 ml  Net 1067.5 ml   Filed Weights   12/18/2012 2030 11/20/12 0148  Weight: 39.009 kg (86 lb) 40.914 kg (90 lb 3.2 oz)    Exam:  General:  No apparent distress, poorly responsive; very cachectic  Cardiovascular: regular rate and rhythm, without MRG  Respiratory: good air movement, clear to auscultation throughout, no wheezing, ronchi or rales  Abdomen: soft, not tender to palpation, positive bowel sounds  MSK: Wet gangrenous right foot. Dressing intact, without bleeding.  Neuro: obtunded  Data Reviewed: Basic Metabolic Panel:  Recent Labs Lab 12/12/2012 2102 11/20/12 0530 11/21/12 0537  NA 143 144 146*  K 4.5 4.0 3.8  CL 109 111 113*  CO2 22 21 25   GLUCOSE 81 83 189*  BUN 27* 24* 19  CREATININE 0.69 0.59 0.83  CALCIUM 10.2 9.3 9.6   Liver Function Tests:  Recent Labs Lab 11/20/12 0530  AST 12  ALT 6  ALKPHOS 60  BILITOT 0.3  PROT 5.7*  ALBUMIN 1.9*   CBC:  Recent Labs Lab 12/01/2012 2102 11/20/12 0530 11/21/12 0537  WBC 5.9 5.6 5.4  NEUTROABS 3.7  --   --   HGB 12.6 11.8* 12.7  HCT 39.7 37.9 40.5  MCV 93.0 94.0 94.2  PLT 136* 122* 127*   Cardiac Enzymes:  Recent Labs Lab 11/20/2012 2102  TROPONINI <0.30   BNP (last 3 results)  Recent Labs  09/16/12 1342 09/23/12 1545 09/27/12 1522  PROBNP 3786.0* 2560.0* 3230.0*   Recent Results (from the past 240 hour(s))  MRSA PCR SCREENING     Status: Abnormal   Collection Time    11/20/12  4:00 AM       Result Value Range Status   MRSA by PCR POSITIVE (*) NEGATIVE Final   Comment:            The GeneXpert MRSA Assay (FDA     approved for NASAL specimens     only), is one component of a     comprehensive MRSA colonization     surveillance program. It is not     intended to diagnose MRSA     infection nor to guide or     monitor treatment for     MRSA infections.     RESULT CALLED TO, READ BACK BY AND VERIFIED WITH:     THOMAS,K AT 0608 ON 11/20/12 BY MOSLEYJ     Studies: Dg  Chest Port 1 View  11/26/2012   *RADIOLOGY REPORT*  Clinical Data: 79 year old female with shortness of breath and altered mental status.  PORTABLE CHEST - 1 VIEW  Comparison: 11/07/2012 and prior chest radiographs  Findings: Cardiomegaly again noted. The left retrocardiac region is difficult to evaluate but has a similar appearance to prior studies. Fullness of superior mediastinum is unchanged. High density within the right lower lung is compatible with prior aspiration. There is no evidence of focal airspace disease, pulmonary edema, suspicious pulmonary nodule/mass, pleural effusion, or pneumothorax. No acute bony abnormalities are identified.  IMPRESSION: Cardiomegaly without definite evidence of acute cardiopulmonary disease.   Original Report Authenticated By: Harmon Pier, M.D.    Scheduled Meds: . aspirin EC  81 mg Oral Daily  . Chlorhexidine Gluconate Cloth  6 each Topical Q0600  . enoxaparin  40 mg Subcutaneous Q24H  . insulin aspart  0-9 Units Subcutaneous TID WC  . isosorbide mononitrate  30 mg Oral Daily  . mupirocin ointment  1 application Nasal BID  . piperacillin-tazobactam (ZOSYN)  IV  3.375 g Intravenous Q8H  . simvastatin  20 mg Oral QHS  . vancomycin  500 mg Intravenous Q24H   Continuous Infusions: . dextrose 5 % and 0.45% NaCl 75 mL/hr at 11/21/12 1610    Active Problems:   Coronary artery disease   Dementia   Atherosclerotic peripheral vascular disease with gangrene  Time spent:  35  Pamella Pert, MD [facility] Pager [phone]. If 7 PM - 7 AM, please contact night-coverage at [email or web address], password [facility] 11/21/2012, 9:45 AM  LOS: 2 days

## 2012-11-21 NOTE — Progress Notes (Signed)
Patient's blood sugar was 56 when obtained by the nurse tech.  Informed MD of blood sugar and MD instructed for blood sugar to be rechecked.  Rechecked blood sugar and reading was 106.   Ellene Route 11/21/2012

## 2012-11-21 NOTE — Clinical Social Work Psychosocial (Signed)
Clinical Social Work Department BRIEF PSYCHOSOCIAL ASSESSMENT 11/21/2012  Patient:  [name], [name]     Account Number:  [id number]     Admit date:  11/27/2012  Clinical Social Worker:  Nancie Neas  Date/Time:  11/21/2012 11:10 AM  Referred by:  Physician  Date Referred:  11/21/2012 Referred for  SNF Placement   Other Referral:   Interview type:  Family Other interview type:   [name]- daughter    PSYCHOSOCIAL DATA Living Status:  FACILITY Admitted from facility:  [facility] Level of care:  Skilled Nursing Facility Primary support name:  [name] Primary support relationship to patient:  CHILD, ADULT Degree of support available:   adequate    CURRENT CONCERNS Current Concerns  Post-Acute Placement   Other Concerns:    SOCIAL WORK ASSESSMENT / PLAN CSW met with pt's daughter, [name] at bedside. Pt sleeping. [name] reports she is HCPOA. Pt does have another daughter whom she used to live with. Pt has been a resident at [facility] for almost 2 months. Family very involved. She was brought to ED for dehydration and right leg ischemia with gangrene. Extensive goals of care conversations were had with [name]. She states she does not want hospice as that would be "playing God." During visit, she brought up hospice with CSW, but again declines and wants PICC line for IV antibiotics. She was agreeable to DNR after discussion with MD. Per Eunice Blase at [facility], pt is skilled currently for nursing and okay to return at d/c. She was made aware of need for IV antibiotics. D/C tomorrow per MD.   Assessment/plan status:  Psychosocial Support/Ongoing Assessment of Needs Other assessment/ plan:   Information/referral to community resources:   [facility]    PATIENT'S/FAMILY'S RESPONSE TO PLAN OF CARE: Pt unable to discuss plan of care. Pt's daughter requesting for pt to return to [facility] and is aware that they can handle IV antibiotics there. Planned return when stable. CSW to  continue to follow.       Derenda Fennel, [place] [phone]

## 2012-11-22 LAB — GLUCOSE, CAPILLARY
Glucose-Capillary: 98 mg/dL (ref 70–99)
Glucose-Capillary: 109 mg/dL — ABNORMAL HIGH (ref 70–99)
Glucose-Capillary: 66 mg/dL — ABNORMAL LOW (ref 70–99)
Glucose-Capillary: 41 mg/dL — CL (ref 70–99)
Glucose-Capillary: 71 mg/dL (ref 70–99)

## 2012-11-22 MED ORDER — DEXTROSE 50 % IV SOLN: 25.0000 mL | Freq: Once | INTRAVENOUS | Status: AC | PRN

## 2012-11-22 MED ORDER — SODIUM CHLORIDE 0.9 % IV BOLUS (SEPSIS)
500.0000 mL | Freq: Once | INTRAVENOUS | Status: AC
  Administered 2012-11-22: 500 mL via INTRAVENOUS

## 2012-11-22 MED ORDER — DEXTROSE-NACL 5-0.45 % IV SOLN: INTRAVENOUS | Status: DC

## 2012-11-22 MED ORDER — IPRATROPIUM BROMIDE 0.02 % IN SOLN
0.5000 mg | Freq: Four times a day (QID) | RESPIRATORY_TRACT | Status: DC | PRN
  Administered 2012-11-23: 0.5 mg via RESPIRATORY_TRACT
  Filled 2012-11-22 (×2): qty 2.5

## 2012-11-22 MED ORDER — ALBUTEROL SULFATE (5 MG/ML) 0.5% IN NEBU
2.5000 mg | INHALATION_SOLUTION | Freq: Four times a day (QID) | RESPIRATORY_TRACT | Status: DC | PRN
  Administered 2012-11-22 – 2012-11-23 (×2): 2.5 mg via RESPIRATORY_TRACT
  Filled 2012-11-22 (×2): qty 0.5

## 2012-11-22 MED ORDER — DEXTROSE 50 % IV SOLN
INTRAVENOUS | Status: AC
  Administered 2012-11-22: 50 mL
  Filled 2012-11-22: qty 50

## 2012-11-22 NOTE — Progress Notes (Addendum)
[facility] PROGRESS NOTE  MONTA MAIORANA WUJ:[id number] DOB: January 19, 1922 DOA: 12/18/2012 PCP: Cassell Smiles., MD  HPI: 79 year old female with a history of CHF, dementia, atrial flutter, CAD, low-grade ITP who was recently discharged from home hospital after patient was treated conservatively for acute right leg arterial embolism, patient was deemed not a surgical candidate. And palliative care was involved. Patient was made DO NOT RESUSCITATE and patient was transferred to nursing home. Patient also was started on anticoagulation with Lovenox and Coumadin as per family request. Today patient was brought to the ED from nursing home for dehydration and altered mental status. Patient is unable to provide any history, so all the history is obtained from the medical records as well as patient's daughter. No history of fever, no nausea vomiting. Patient is found to be hypotensive along with altered mental status  Interval progression Patient came in dehydrated and more altered than normal - per family. She came in with low-grade temp, tachycardia, and it was felt that this is likely due to her right lower extremity gangrenous foot that is progressively getting infected. Patient was started on vancomycin and Zosyn, received IV hydration, with some improvement mental status - again this is per family. Patient has multiple medical conditions as well as advanced age and progressive dementia. Over the last few days have had several discussions with the patient's daughter, [name] (who is the legal POA) about her mother's condition. At length we discussed antibiotics, aggressive versus comfort care, and have detailed all the patient's medical conditions as well as the overall picture. Patient has a very poor prognosis, she has advanced dementia, a non-operable right infected gangrenous foot and is dying. It seems like the family has a very difficult time letting go. Her daughter avoids answering when I asked  her what would the patient want for herself in this instance, as this should be the main guidance for care rather than the family's personal wishes. Her daughter is somewhat disconnected with the overall condition of the patient, and seems concerned that if the patient is not currently able to take oral medications including her heart medications, "what if she has a heart attack?". I have extensively tried to explain that all medical interventions at this point will not reverse her mother's condition, but it may prolong life. Daughter is unable to tell me if her mother would have wished this quality of life and her daughter does not think that her mother suffering at this point; she tells me that she prays to God that her mother gets better and that God advised her to continue as we are doing now. I discussed with the daughter that the patient is high risk of aspiration if she continues to be eating, but I am okay with her getting comfort food as long as we understand the risk of aspiration. I think that, at this point, she is even aspirating her own secretions. At one point her daughter asked me about a feeding tube. We talked about the option, which I do not think is feasible, as it will not prevent further aspirations nor it would increase her quality of life, as well as subjecting the patient to an invasive and potentially painful procedure. Her daughter insists that we continue antibiotics, but seems she is unable to use any by mouth pills will probably need a PICC line for a 14 day course of antibiotics for her right lower extremity. PICC line placement was technically difficult here and have explained this to the daughter,  to which she replied "then I have to pray harder". I will talk to radiology and see our options.  Assessment/Plan:  Goals of care - ongoing discussions.  For now, we'll continue medical management with antibiotics, comfort feeding per previous swallow evaluations, and focus as well on  patient's comfort. - today patient seems unchanged for me but family states that she is more alert. Will attempt at discussing again goals of care, but may need to do PICC line and IV antibiotics for a 1-2 week course at her SNF. Patient's altered mental status is probably due to infected leg, gangrene, advancing dementia.  - Continue vancomycin and Zosyn; if she will be able to have some po intake, would favor using oral abx. Acute right leg ischemia with gangrene - Consult management was offered by a vascular surgeon due to patient's multiple medical problems with advanced age she was deemed not a surgical candidate. Continued Lovenox; restart coumadin once able to tolerate better po.  Hypotension - supportive hydration. We'll continue the IV fluids   Code Status: DO NOT RESUSCITATE Family Communication: Daughter bedside  Disposition Plan: SNF 1-2 days  Consultants:  None  Procedures:  None  Anti-infectives   Start     Dose/Rate Route Frequency Ordered Stop   11/21/12 0600  vancomycin (VANCOCIN) 500 mg in sodium chloride 0.9 % 100 mL IVPB     500 mg 100 mL/hr over 60 Minutes Intravenous Every 24 hours 11/20/12 0758     11/20/12 0300  piperacillin-tazobactam (ZOSYN) IVPB 3.375 g     3.375 g 12.5 mL/hr over 240 Minutes Intravenous Every 8 hours 11/20/12 0254     11/20/12 0300  vancomycin (VANCOCIN) IVPB 750 mg/150 ml premix     750 mg 150 mL/hr over 60 Minutes Intravenous  Once 11/20/12 0254 11/20/12 0512     Antibiotics Given (last 72 hours)   Date/Time Action Medication Dose Rate   11/20/12 0412 Given   piperacillin-tazobactam (ZOSYN) IVPB 3.375 g 3.375 g 12.5 mL/hr   11/20/12 0412 Given   vancomycin (VANCOCIN) IVPB 750 mg/150 ml premix 750 mg 150 mL/hr   11/20/12 1120 Given   piperacillin-tazobactam (ZOSYN) IVPB 3.375 g 3.375 g 12.5 mL/hr   11/20/12 2042 Given   piperacillin-tazobactam (ZOSYN) IVPB 3.375 g 3.375 g 12.5 mL/hr   11/21/12 0359 Given    piperacillin-tazobactam (ZOSYN) IVPB 3.375 g 3.375 g 12.5 mL/hr   11/21/12 0521 Given   vancomycin (VANCOCIN) 500 mg in sodium chloride 0.9 % 100 mL IVPB 500 mg 100 mL/hr   11/21/12 1152 Given   piperacillin-tazobactam (ZOSYN) IVPB 3.375 g 3.375 g 12.5 mL/hr   11/21/12 1801 Given   piperacillin-tazobactam (ZOSYN) IVPB 3.375 g 3.375 g 12.5 mL/hr   11/22/12 0400 Given   piperacillin-tazobactam (ZOSYN) IVPB 3.375 g 3.375 g 12.5 mL/hr   11/22/12 0601 Given   vancomycin (VANCOCIN) 500 mg in sodium chloride 0.9 % 100 mL IVPB 500 mg 100 mL/hr      HPI/Subjective: - Patient is nonverbal  Objective: Filed Vitals:   11/21/12 0527 11/21/12 1300 11/21/12 2012 11/22/12 0515  BP: 93/56 99/67 85/57  86/49  Pulse: 116 97 95 93  Temp: 97.5 F (36.4 C) 97.9 F (36.6 C) 97.4 F (36.3 C) 97.2 F (36.2 C)  TempSrc:  Axillary Axillary Axillary  Resp: 16 16 16 16   Height:      Weight:      SpO2:        Intake/Output Summary (Last 24 hours) at 11/22/12 1120 Last  data filed at 11/22/12 0700  Gross per 24 hour  Intake      0 ml  Output      0 ml  Net      0 ml   Filed Weights   12-13-12 2030 11/20/12 0148  Weight: 39.009 kg (86 lb) 40.914 kg (90 lb 3.2 oz)    Exam:  General:  No apparent distress, poorly responsive; very cachectic  Cardiovascular: regular rate and rhythm, without MRG  Respiratory: good air movement, clear to auscultation throughout, no wheezing, ronchi or rales  Abdomen: soft, not tender to palpation, positive bowel sounds  MSK: Wet gangrenous right foot. Dressing intact, without bleeding.  Neuro: obtunded  Data Reviewed: Basic Metabolic Panel:  Recent Labs Lab 12-13-12 2102 11/20/12 0530 11/21/12 0537  NA 143 144 146*  K 4.5 4.0 3.8  CL 109 111 113*  CO2 22 21 25   GLUCOSE 81 83 189*  BUN 27* 24* 19  CREATININE 0.69 0.59 0.83  CALCIUM 10.2 9.3 9.6   Liver Function Tests:  Recent Labs Lab 11/20/12 0530  AST 12  ALT 6  ALKPHOS 60  BILITOT 0.3   PROT 5.7*  ALBUMIN 1.9*   CBC:  Recent Labs Lab 12-13-2012 2102 11/20/12 0530 11/21/12 0537  WBC 5.9 5.6 5.4  NEUTROABS 3.7  --   --   HGB 12.6 11.8* 12.7  HCT 39.7 37.9 40.5  MCV 93.0 94.0 94.2  PLT 136* 122* 127*   Cardiac Enzymes:  Recent Labs Lab 2012/12/13 2102  TROPONINI <0.30   BNP (last 3 results)  Recent Labs  09/16/12 1342 09/23/12 1545 09/27/12 1522  PROBNP 3786.0* 2560.0* 3230.0*   Recent Results (from the past 240 hour(s))  MRSA PCR SCREENING     Status: Abnormal   Collection Time    11/20/12  4:00 AM      Result Value Range Status   MRSA by PCR POSITIVE (*) NEGATIVE Final   Comment:            The GeneXpert MRSA Assay (FDA     approved for NASAL specimens     only), is one component of a     comprehensive MRSA colonization     surveillance program. It is not     intended to diagnose MRSA     infection nor to guide or     monitor treatment for     MRSA infections.     RESULT CALLED TO, READ BACK BY AND VERIFIED WITH:     THOMAS,K AT 0608 ON 11/20/12 BY MOSLEYJ     Studies: Dg Chest Port 1 View  11/21/2012   *RADIOLOGY REPORT*  Clinical Data: PICC line placement  PORTABLE CHEST - 1 VIEW  Comparison: Portable exam 1408 hours compared to 13-Dec-2012  Findings: Right arm PICC line enters the right jugular vein and extends cranially above the extent of this exam. Enlargement of cardiac silhouette with pulmonary vascular congestion. Atherosclerotic calcification aorta. Persistent atelectasis versus consolidation left lower lobe. Minimal atelectasis at right lung base. Stippled opacities at right lung base question old aspirated contrast. Small left pleural effusion not excluded. Bones diffusely demineralized.  IMPRESSION: Tip of right arm PICC line projects over the right jugular vein with the tip above the extent of this exam; recommend repositioning. Enlargement of cardiac silhouette with pulmonary vascular congestion. Left lower lobe atelectasis versus  consolidation.  Findings called to Dena Billet on the PICC service at 1427 hours on 11/21/2012.   Original Report Authenticated  By: Ulyses Southward, M.D.   Dg Chest Port 1v Same Day  11/21/2012   *RADIOLOGY REPORT*  Clinical Data: Reposition PICC line.  PORTABLE CHEST - 1 VIEW SAME DAY  Comparison: 11/21/2012  Findings: Right PICC line continues to course superiorly into the right internal jugular vein, unchanged.  Cardiomegaly.  Bilateral lower lobe airspace opacities and layering effusions.  This is increased in the right base since prior study. No acute bony abnormality.  IMPRESSION: Right PICC line continues to course superiorly into the right internal jugular vein.  Bilateral effusions and bibasilar opacities, worsening on the right since prior study.   Original Report Authenticated By: Charlett Nose, M.D.    Scheduled Meds: . aspirin EC  81 mg Oral Daily  . Chlorhexidine Gluconate Cloth  6 each Topical Q0600  . enoxaparin  40 mg Subcutaneous Q24H  . insulin aspart  0-9 Units Subcutaneous TID WC  . isosorbide mononitrate  30 mg Oral Daily  . mupirocin ointment  1 application Nasal BID  . piperacillin-tazobactam (ZOSYN)  IV  3.375 g Intravenous Q8H  . simvastatin  20 mg Oral QHS  . sodium chloride  500 mL Intravenous Once  . vancomycin  500 mg Intravenous Q24H   Continuous Infusions: . dextrose 5 % and 0.45% NaCl 100 mL/hr at 11/22/12 0601    Active Problems:   Coronary artery disease   Dementia   Atherosclerotic peripheral vascular disease with gangrene  Time spent: 35  Pamella Pert, MD [facility] Pager [phone]. If 7 PM - 7 AM, please contact night-coverage at [email or web address], password [facility] 11/22/2012, 11:20 AM  LOS: 3 days

## 2012-11-22 NOTE — Progress Notes (Signed)
Pt po medication not given due to patient unable to be aroused or stay awake to take medication.  Discussed with family as to why medication was not given at this time.

## 2012-11-22 NOTE — Clinical Social Work Note (Signed)
CSW received HCPOA paperwork from pt's daughter [name] and placed copy on shadow chart.   Derenda Fennel, [place] [phone]

## 2012-11-22 NOTE — Clinical Documentation Improvement (Signed)
THIS DOCUMENT IS NOT A PERMANENT PART OF THE MEDICAL RECORD  Please update your documentation with the medical record to reflect your response to this query. If you need help knowing how to do this please call [phone].  11/22/12  Dear Dr. Elvera Lennox Marton Redwood  A review of the patient medical record has revealed the following indicators.  Based on your clinical judgment, please clarify and document in a progress note and/or discharge summary the clinical condition associated with the following supporting information:  Per H&P: Admitted with dehydration and altered mental status Patient is found to be hypotensive along with altered mental status  Atherosclerotic peripheral vascular disease with gangrene  Patient's altered mental status is probably due to infected leg, gangrene very close to her death due to infected right leg with gangrene. Per progress note 8/4 Patient's altered mental status is probably due to infected leg, gangrene, advancing dementia.  Neuro: obtunded General: No apparent distress, poorly responsive   Possible Clinical Conditions?  Encephalopathy (describe type if known)                       Septic                       Alcoholic                        Metabolic                      Toxic Other Condition__________________ Cannot Clinically Determine    Reviewed: additional documentation in the medical record. Cannot clinically determine  Thank You,  Harless Litten RN Clinical Documentation Specialist: [phone] [facility] Health Information Management [place]

## 2012-11-23 DIAGNOSIS — A419 Sepsis, unspecified organism: Secondary | ICD-10-CM

## 2012-11-23 DIAGNOSIS — G9340 Encephalopathy, unspecified: Secondary | ICD-10-CM

## 2012-11-23 LAB — GLUCOSE, CAPILLARY
Glucose-Capillary: 179 mg/dL — ABNORMAL HIGH (ref 70–99)
Glucose-Capillary: 67 mg/dL — ABNORMAL LOW (ref 70–99)
Glucose-Capillary: 161 mg/dL — ABNORMAL HIGH (ref 70–99)
Glucose-Capillary: 149 mg/dL — ABNORMAL HIGH (ref 70–99)
Glucose-Capillary: 103 mg/dL — ABNORMAL HIGH (ref 70–99)

## 2012-11-23 LAB — CBC
HCT: 40.8 % (ref 36.0–46.0)
MCHC: 32.1 g/dL (ref 30.0–36.0)
Platelets: 131 10*3/uL — ABNORMAL LOW (ref 150–400)
RDW: 19.6 % — ABNORMAL HIGH (ref 11.5–15.5)
WBC: 6.6 10*3/uL (ref 4.0–10.5)

## 2012-11-23 LAB — BASIC METABOLIC PANEL
BUN: 12 mg/dL (ref 6–23)
Chloride: 112 mEq/L (ref 96–112)
Creatinine, Ser: 0.58 mg/dL (ref 0.50–1.10)
GFR calc Af Amer: >90 mL/min (ref >90)
GFR calc non Af Amer: 79 mL/min — ABNORMAL LOW (ref >90)

## 2012-11-23 MED ORDER — DEXTROSE 50 % IV SOLN: 25.0000 mL | Freq: Once | INTRAVENOUS | Status: AC | PRN

## 2012-11-23 MED ORDER — DEXTROSE 50 % IV SOLN
INTRAVENOUS | Status: AC
  Administered 2012-11-23: 50 mL
  Filled 2012-11-23: qty 50

## 2012-11-23 MED ORDER — POTASSIUM CHLORIDE 10 MEQ/100ML IV SOLN
INTRAVENOUS | Status: AC
  Filled 2012-11-23: qty 100

## 2012-11-23 MED ORDER — DEXTROSE-NACL 5-0.45 % IV SOLN
INTRAVENOUS | Status: DC
  Administered 2012-11-23: 11:00:00 via INTRAVENOUS

## 2012-11-23 MED ORDER — POTASSIUM CHLORIDE 10 MEQ/100ML IV SOLN
10.0000 meq | INTRAVENOUS | Status: AC
  Administered 2012-11-23 (×4): 10 meq via INTRAVENOUS
  Filled 2012-11-23 (×3): qty 100

## 2012-11-23 NOTE — Progress Notes (Signed)
Pt's daughter states that since her mother is not on her bottom, that she does not have to be turned at this time.

## 2012-11-23 NOTE — Clinical Social Work Note (Signed)
CSW spoke w [facility], updated facility on patient status.  Facility continues to be willing to accept patient at discharge.  Santa Genera, LCSW Clinical Social Worker [phone])

## 2012-11-23 NOTE — Progress Notes (Addendum)
[facility] PROGRESS NOTE  [name] YNW:[id number] DOB: 16-Jun-1921 DOA: 11/21/2012 PCP: Cassell Smiles., MD  Addendum 1954: Discussed with HCPOA/daughter--updated on lab work which is suspicious for metabolic acidosis developing. Blood sugars have improved with D5 half-normal saline. Oxygenation has been difficult to gauge given poor perfusion. There has been no oral intake. HCPOA has had limited discussions with family members but does not appear to accept that her mother faces an incurable illness that is likely to result in her death in the next few days. We will discuss again in the morning. I did reiterate the critical nature of her illness and that death is expected in the next few days.  Assessment/Plan: 1. 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 Continues on antibiotics and Lovenox. 2. Suspected sepsis as above 3. Acute encephalopathy: Appears persistent and without change. She was noted to be obtunded on admission and nonverbal on subsequent followup visits. 4. Acute respiratory failure: There is difficulty obtaining an accurate measurement of her hypoxia but her oxygen requirement appears to increase. 5. Diabetes mellitus with recurrent hypoglycemia: Severe hypoglycemia has been recurrent since admission, profound this morning. Needs close monitoring. Suspect secondary to infection complicated by negligible oral intake. Hypoglycemia was also noted in the emergency Department 7/21. 6. Failure to thrive: Ominous. Negligible oral intake during this admission. 7. Dysphagia: High risk for aspiration. Pured diet and thin liquids with full assistance and aspiration precautions if improves. 8. Dementia 9. Chronic systolic congestive heart failure: Some evidence of peripheral volume overload. I suspect intravascular status is dry. 10. History of atrial flutter, sick sinus  syndrome, coronary artery disease, low-grade ITP. 11. Underweight  Comment: The patient appears to be dying. The right lower extremity ischemia and gangrene has been documented since 09/2012. This appears to have transitioned to "wet" gangrene and the patient has signs and symptoms to suggest ongoing infection and likely sepsis including recurrent profound hypoglycemia, hemodynamic instability, oliguria. Because of access issues no blood work has been able to be obtained for the last 2 days. This is further complicated by profound failure to thrive, negligible oral intake and obtunded status. Review of the record is notable for multiple physicians recommending hospice or comfort care including vascular surgery Dr. Imogene Burn as well as attending physicians at [facility] during that hospitalization. I reviewed the full history, laboratory findings, current active, acute, chronic issues with healthcare power of attorney/daughter at bedside in the continuous presence of [name], Charity fundraiser. From a purely medical standpoint it does not appear that any further medical intervention would be of likely benefit to this patient. Continued antibiotics will not cure infection given chronic limb ischemia and gangrene, she has been deemed to not be a surgical candidate except for palliative reasons per vascular surgery, and her condition is further complicated by failure to thrive and recurrent hypoglycemia. In the absence of nutrition recovery could not be reasonably expected. I presented options including aggressive management with PICC line placement, continued antibiotics and IV fluids versus continued management with peripheral IV, antibiotics and fluids versus full comfort care. The patient is DO NOT RESUSCITATE and not a surgical candidate. She was set for transfer to [facility] today for PICC line placement, however this was held secondary to her profound hypoglycemia this morning and worsening hypoxia. I discussed access issues with  family. Specifically I think transport in her current condition and instability does put her at increased risk of death even for relatively "minor"  procedure and I do not think the benefit outweighs risk. However current access if lost would need to either be foregone or to have a central line placed. I have recommended full comfort care as I think current medical treatment is prolonging this illness without reasonable hope of cure. This has apparently been discussed (according to chart review) in the past. Daughter wants to discuss the situation with her family and pastor. She voiced full understanding of all these issues and the options available and requested not to transport for PICC line placement at this point. She is understanding that access may be lost.   Monitor blood sugars closely, every 2 hours  Change to D5 half-normal saline  Remain n.p.o.  Followup goals of care with daughter after family meeting  Patient is critically ill and in hospital death is felt to be very likely  Code Status: DO NOT RESUSCITATE DVT prophylaxis: Lovenox Family Communication: As above Disposition Plan: Pending  Brendia Sacks, MD  [facility]  Pager [phone] If 7PM-7AM, please contact night-coverage at [email or web address], password [email or web address] 11/23/2012, 9:48 AM  LOS: 4 days   Clinical Summary: 79 year old woman with history of right lower extremity gangrene, no intervention recommended as well as complex past medical history presented to the emergency department for dehydration and altered mental status. She was admitted for treatment of suspected wet gangrene/sepsis, hypotension.  Hospitalized 09/2012 at [facility] for acute on chronic heart failure. During that hospitalization she developed right leg pain and was transferred to [facility] for suspected right leg arterial embolism. She was seen by vascular surgery and conservative measures directed towards comfort and hospice were suggested. During that  hospitalization in June she was noted to be clearly developing gangrene of the right leg and he was also noted that she appeared to be chronically aspirating. Prognosis was noted to be poor at that time. She was noted to be minimally verbal at that time. Vascular surgery did not recommend palliative AKA in June because of lack of pain. Only comfort care should be offered per vascular surgery recommendations.   Consultants:    Procedures:  8/5 PICC line placement failed  Antibiotics:  Vancomycin 8/3 >>   Zosyn 8/3 >>   HPI/Subjective: Discussed with RN--there is been no improvement in her clinical status. There has been no oral intake of significant benefit. Profound hypoglycemia has been recurrent. Hypoxia has persisted, it is difficult to measure with pulse oximetry and she has been placed on a Venturi mask. The patient remains nonresponsive and can offer no history.  Objective: Filed Vitals:   11/22/12 0515 11/22/12 1414 11/22/12 2025 11/23/12 0613  BP: 86/49 85/55 93/55  109/63  Pulse: 93 98 95 86  Temp: 97.2 F (36.2 C) 97.2 F (36.2 C) 97.6 F (36.4 C) 97.3 F (36.3 C)  TempSrc: Axillary Axillary Axillary Oral  Resp: 16 17 16 16   Height:      Weight:        Intake/Output Summary (Last 24 hours) at 11/23/12 0948 Last data filed at 11/23/12 0549  Gross per 24 hour  Intake 4779.17 ml  Output      0 ml  Net 4779.17 ml     Filed Weights   11/25/2012 2030 11/20/12 0148  Weight: 39.009 kg (86 lb) 40.914 kg (90 lb 3.2 oz)    Exam:   Intermittent hypotension has persisted. Temperature has been stable. Heart rate respirations stable. Hypoxia appears to be worsening.  General: The patient appears calm and comfortable. No visible signs  of suffering are seen. She does not respond to voice, gentle tactile stimulation. She does minimally respond with withdrawal from painful stimulus. She appears to be dying.   Eyes: Pupils equal, round, sluggish, equal, bilateral cataracts  seen.   ENT: Mucous membranes are dry. She does not open her mouth fully.  Neck appears unremarkable.  Cardiovascular: Irregular. Normal rate. No murmur, rub, gallop. 1+ left lower extremity edema.  Respiratory: Clear to auscultation bilaterally. No wheezes, rales, rhonchi. Normal respiratory effort.  Abdomen: Soft, nontender, nondistended.  Breasts: Appear unremarkable.  Skin: Right lower extremity notable for full thickness wound over the anterior lower leg, the right foot is black.   Data Reviewed:  Capillary blood sugars noted to be recurrently profoundly low including 19 this a.m. There is no oral intake recorded    there are no laboratory studies for review today   Pending studies:   None today. Could not be obtained.  Scheduled Meds: . aspirin EC  81 mg Oral Daily  . Chlorhexidine Gluconate Cloth  6 each Topical Q0600  . enoxaparin  40 mg Subcutaneous Q24H  . insulin aspart  0-9 Units Subcutaneous TID WC  . isosorbide mononitrate  30 mg Oral Daily  . mupirocin ointment  1 application Nasal BID  . piperacillin-tazobactam (ZOSYN)  IV  3.375 g Intravenous Q8H  . simvastatin  20 mg Oral QHS  . vancomycin  500 mg Intravenous Q24H   Continuous Infusions: . dextrose 5 % and 0.45% NaCl 100 mL/hr at 11/22/12 1324  . dextrose 5 % and 0.45% NaCl      Principal Problem:   Atherosclerotic peripheral vascular disease with gangrene Active Problems:   Coronary artery disease   Dementia   Acute respiratory failure with hypoxia   Sepsis   Acute encephalopathy   Time spent 60 minutes greater than 30% in counseling and coordination of care.

## 2012-11-23 NOTE — Progress Notes (Signed)
CRITICAL VALUE ALERT  Critical value received:  CBG 53  Date of notification:  11/23/2012    Time of notification:  0730   Critical value read back:yes  Nurse who received alert: Reva Bores  MD notified (1st page): C. Gherghe  Time of first page:  0745  MD notified (2nd page):  Time of second page:  Responding MD:  Wendy Poet  Time MD responded:  [id number]

## 2012-11-24 DIAGNOSIS — J9600 Acute respiratory failure, unspecified whether with hypoxia or hypercapnia: Secondary | ICD-10-CM

## 2012-11-24 DIAGNOSIS — Z7189 Other specified counseling: Secondary | ICD-10-CM

## 2012-11-24 DIAGNOSIS — G9340 Encephalopathy, unspecified: Secondary | ICD-10-CM

## 2012-11-24 LAB — GLUCOSE, CAPILLARY
Glucose-Capillary: 87 mg/dL (ref 70–99)
Glucose-Capillary: 35 mg/dL — CL (ref 70–99)
Glucose-Capillary: 61 mg/dL — ABNORMAL LOW (ref 70–99)
Glucose-Capillary: 113 mg/dL — ABNORMAL HIGH (ref 70–99)
Glucose-Capillary: 65 mg/dL — ABNORMAL LOW (ref 70–99)

## 2012-11-24 LAB — BASIC METABOLIC PANEL
CO2: 19 mEq/L (ref 19–32)
Chloride: 112 mEq/L (ref 96–112)
Sodium: 142 mEq/L (ref 135–145)

## 2012-11-24 MED ORDER — LORAZEPAM 0.5 MG PO TABS
0.5000 mg | ORAL_TABLET | Freq: Four times a day (QID) | ORAL | Status: DC | PRN
  Administered 2012-11-24: 0.5 mg via SUBLINGUAL
  Filled 2012-11-24: qty 1

## 2012-11-24 MED ORDER — DEXTROSE 50 % IV SOLN: 25.0000 mL | Freq: Once | INTRAVENOUS | Status: DC | PRN

## 2012-11-24 MED ORDER — GLUCOSE 40 % PO GEL
ORAL | Status: AC
  Administered 2012-11-24: 15 g
  Filled 2012-11-24: qty 0.83

## 2012-11-24 MED ORDER — MORPHINE SULFATE (CONCENTRATE) 10 MG /0.5 ML PO SOLN
2.5000 mg | ORAL | Status: DC | PRN
  Administered 2012-11-24 – 2012-11-28 (×12): 2.6 mg via ORAL
  Filled 2012-11-24 (×12): qty 0.5

## 2012-11-24 MED ORDER — DEXTROSE 5 % IV SOLN: INTRAVENOUS | Status: DC

## 2012-11-24 NOTE — Progress Notes (Signed)
Rounding with Dr Irene Limbo this am,patient nonresponsive no verbal communication at this time, family at bedside very supportive. Patient condition was discussed,poor prognosis,[name] the daughter verbalized understanding,hard for her to accept but she understood. Dr Lynann Bologna emotional support, she was very thankful. I reaffirmed that we the healthcare team are here for you as well as your mom. Will continue to monitor patient and support.

## 2012-11-24 NOTE — Progress Notes (Signed)
Nutrition Follow-up   INTERVENTION: RD will continue to follow care progression   NUTRITION DIAGNOSIS: 1.Severe Malnutrition in the context of acute and chronic illness given pt oral intake </= 50% for >/= 5 days and moderate depletion of body fat and muscle (orbital and temporal wasting) 2.Inadequate oral intake related to swallow difficulty, Chronic disease progression as evidenced by gangrene of right foot, dysphagia and dementia   Goal: Meet nutrition needs as able  Monitor:  Po intake, labs and wt trends  79 y.o. female   Admitting Dx: dehydration, altered mental status  ASSESSMENT: Daughter at bedside.  Pt continues NPO not alert for oral intake. She meets criteria for severe malnutrition in the context of acute illness given her NPO status x 5 days and moderate depletion of fat and muscle.  Height: Ht Readings from Last 1 Encounters:  11/20/12 5\' 2"  (1.575 m)    Weight: Wt Readings from Last 1 Encounters:  11/20/12 90 lb 3.2 oz (40.914 kg)    Ideal Body Weight: 110#  % Ideal Body Weight: 82%  Wt Readings from Last 10 Encounters:  11/20/12 90 lb 3.2 oz (40.914 kg)  10/06/12 86 lb 6.7 oz (39.2 kg)  09/23/12 112 lb (50.803 kg)  09/20/12 112 lb (50.803 kg)  04/19/12 110 lb (49.896 kg)  01/18/12 112 lb (50.803 kg)  07/14/11 106 lb (48.081 kg)    Usual Body Weight: 105-110#  % Usual Body Weight: 86%  BMI:  Body mass index is 16.49 kg/(m^2).underweight  Estimated Nutritional Needs: Kcal: 1435-1640 Protein: 62-78 gr Fluid: 1200-1400 ml/day  Skin: unstageable areas to right lower leg and foot, stage II to sacrum and buttocks  Diet Order: NPO  EDUCATION NEEDS: -No education needs identified at this time   Intake/Output Summary (Last 24 hours) at 11/24/12 0827 Last data filed at 11/23/12 1800  Gross per 24 hour  Intake 338.75 ml  Output      0 ml  Net 338.75 ml    Last BM:  PTA  Labs:   Recent Labs Lab 11/21/12 0537 11/23/12 1355  11/24/12 0543  NA 146* 140 142  K 3.8 2.9* 3.7  CL 113* 112 112  CO2 25 16* 19  BUN 19 12 12   CREATININE 0.83 0.58 0.75  CALCIUM 9.6 8.9 9.3  GLUCOSE 189* 284* 192*    CBG (last 3)   Recent Labs  11/24/12 0421 11/24/12 0609 11/24/12 0803  GLUCAP 59* 35* 61*    Scheduled Meds: . Chlorhexidine Gluconate Cloth  6 each Topical Q0600  . enoxaparin  40 mg Subcutaneous Q24H  . isosorbide mononitrate  30 mg Oral Daily  . mupirocin ointment  1 application Nasal BID  . piperacillin-tazobactam (ZOSYN)  IV  3.375 g Intravenous Q8H  . simvastatin  20 mg Oral QHS  . vancomycin  500 mg Intravenous Q24H    Continuous Infusions: . dextrose    . dextrose 5 % and 0.45% NaCl 75 mL/hr at 11/23/12 1129    Past Medical History  Diagnosis Date  . Coronary artery disease   . Ventricular tachycardia   . CHF (congestive heart failure)   . GERD (gastroesophageal reflux disease)   . Atrial flutter   . Sick sinus syndrome   . Dementia   . Diabetes mellitus   . Thrombocytopenia   . Right bundle branch block   . Hyperlipidemia   . TIA (transient ischemic attack)   . Arthritis   . ITP (idiopathic thrombocytopenic purpura) 07/14/2011    Chronic  low grade idiopathic thrombocytopenia purpura still not in need of therapy.   Marland Kitchen URI (upper respiratory infection)     history  . Hx: UTI (urinary tract infection)   . Chronic kidney disease   . Pneumonia   . Acute MI   . Chronic diastolic heart failure   . Diseases of tricuspid valve   . Dysphagia   . Hypoxemia     Past Surgical History  Procedure Laterality Date  . Coronary angioplasty with stent placement    . Biopsy thyroid      Royann Shivers MS,RD,LDN,CSG Office: #[phone] Pager: [phone]

## 2012-11-24 NOTE — Progress Notes (Addendum)
[facility] PROGRESS NOTE  PUALANI BORAH ZOX:[id number] DOB: Nov 29, 1921 DOA: 12-05-12 PCP: Cassell Smiles., MD  Addendum 1650:  IV access could not be obtained. Discussed with Renee Harder (confirmed with paperwork on chart that [name] is sole POA). She understands and reaffirms decision not to pursue PICC line or central line. She affirms decision to move to full comfort care, d/c all curative attempts including antibiotics as well as fluids. She understands that her mother is dying. She asked me to relay acute issues and decisions reached to other family members. There is considerable friction between family members (per [name]) and communication between them is limited. I did speak with daughter Tyrell Antonio at bedside.  Assessment/Plan: 1. 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  2. Suspected sepsis as above 3. Acute encephalopathy: Persistent, no change. She was noted to be obtunded on admission and nonverbal on subsequent followup visits. 4. Acute respiratory failure: This appears stable, oxygenation difficult to measure secondary to poor perfusion. 5. Diabetes mellitus with recurrent hypoglycemia: Severe hypoglycemia has been recurrent since admission. Suspect secondary to infection complicated by negligible oral intake. Hypoglycemia was also noted in the emergency Department 7/21. 6. Failure to thrive: Ominous. Negligible oral intake during this admission. 7. Dysphagia: High risk for aspiration. 8. Dementia 9. Chronic systolic congestive heart failure: Some evidence of peripheral volume overload. I suspect intravascular status is dry. 10. History of atrial flutter, sick sinus syndrome, coronary artery disease, low-grade ITP. 11. Underweight  Comment: Long discussion this morning with [name] healthcare power of attorney/daughter at bedside with RN Val present the  entire time. We discussed again blood work results, recurrent hypoglycemia, failure to thrive, negligible oral intake, presumed wet gangrene superimposed on dry gangrene right lower extremity, respiratory failure, acute encephalopathy. Patient has lost IV access. We discussed options available including another attempt at IV access, continued fluids and antibiotics versus comfort care. I again expressed that I believe any medical treatment would prolong the dying process without giving the patient a meaningful chance of recovery. I think transport to obtain a PICC line is not in the patient's interests and that the risk of transport even for this minor procedure is not worth taking. Her daughter has spent some time thinking this over, and has a better understanding today of the overwhelming issues present. She would like to attempt peripheral access for administration of fluids and antibiotics, however if this fails she would like to proceed with full comfort care. She appears to understand now that the patient is dying, I did share with her that I think this will be within the next 24-48 hours.   Attempt peripheral access--is successful resume fluids and antibiotics.  Treat hypoglycemia as able.  Continue to consider comfort care.  Code Status: DO NOT RESUSCITATE DVT prophylaxis: Lovenox Family Communication: As above Disposition Plan: Pending  Brendia Sacks, MD  [facility]  Pager [phone] If 7PM-7AM, please contact night-coverage at [email or web address], password [facility] 11/24/2012, 9:33 AM  LOS: 77 days   Clinical Summary: 79 year old woman with history of right lower extremity gangrene, no intervention recommended as well as complex past medical history presented to the emergency department for dehydration and altered mental status. She was admitted for treatment of suspected wet gangrene/sepsis, hypotension.  Hospitalized 09/2012 at [facility] for acute on chronic heart failure. During that  hospitalization she developed right leg pain and was transferred to [facility] for suspected right leg arterial embolism. She was seen  by vascular surgery and conservative measures directed towards comfort and hospice were suggested. During that hospitalization in June she was noted to be clearly developing gangrene of the right leg and he was also noted that she appeared to be chronically aspirating. Prognosis was noted to be poor at that time. She was noted to be minimally verbal at that time. Vascular surgery did not recommend palliative AKA in June because of lack of pain. Only comfort care should be offered per vascular surgery recommendations.   Consultants:    Procedures:  8/5 PICC line placement failed  Antibiotics:  Vancomycin 8/3 >>   Zosyn 8/3 >>   HPI/Subjective: Lost IV access overnight. Hypoglycemic this morning. Patient remains unresponsive.  Objective: Filed Vitals:   11/23/12 1339 11/23/12 1710 11/23/12 2058 11/24/12 0650  BP: 113/80  97/50   Pulse: 65 68    Temp: 97.7 F (36.5 C)  97.5 F (36.4 C) 97.4 F (36.3 C)  TempSrc: Axillary  Axillary Axillary  Resp: 16 15 16 15   Height:      Weight:      SpO2:        Intake/Output Summary (Last 24 hours) at 11/24/12 0933 Last data filed at 11/23/12 1800  Gross per 24 hour  Intake 338.75 ml  Output      0 ml  Net 338.75 ml     Filed Weights   2012/12/02 2030 11/20/12 0148  Weight: 39.009 kg (86 lb) 40.914 kg (90 lb 3.2 oz)    Exam:   Afebrile, intermittent hypotension noted. Blood pressure as well as SpO2 is been intermittently unobtainable secondary to poor perfusion.  General: Appears very calm and comfortable. No objective evidence to suggest pain or suffering. Breathing is irregular and she appears to be dying. She does not respond to voice, does not open her eyes or interact. There is mild withdrawal to pain.  Eyes: Pupils equal, round, minimally reactive. Cataracts bilateral.  Cardiovascular:  Tachycardic, regular rhythm. No murmur, rub, gallop. Bilateral upper extremity edema noted, no right lower extremity edema. Minimal left lower extremity edema.  Respiratory: Irregular breathing, very shallow, very poor air movement. No frank wheezes, rales, rhonchi. Does not appear to be in distress  Abdomen: Soft, nontender, nondistended  Skin: No change right lower extremity, full thickness wound noted with exudate, right foot black.  Muscular skeletal: Difficult to assess. Patient does not follow commands.  Neurologic: Patient remains obtunded.   Data Reviewed:  No oral intake. Urine output poor.  Pending studies:   Basic metabolic panel unremarkable. Glucose remains very labile but persistently low off D5.  Scheduled Meds: . Chlorhexidine Gluconate Cloth  6 each Topical Q0600  . enoxaparin  40 mg Subcutaneous Q24H  . isosorbide mononitrate  30 mg Oral Daily  . mupirocin ointment  1 application Nasal BID  . piperacillin-tazobactam (ZOSYN)  IV  3.375 g Intravenous Q8H  . simvastatin  20 mg Oral QHS  . vancomycin  500 mg Intravenous Q24H   Continuous Infusions: . dextrose    . dextrose 5 % and 0.45% NaCl 75 mL/hr at 11/23/12 1129    Principal Problem:   Atherosclerotic peripheral vascular disease with gangrene Active Problems:   Coronary artery disease   Dementia   Acute respiratory failure with hypoxia   Sepsis   Acute encephalopathy   Time spent 35 minutes, greater then 50% in counseling and coordination of care

## 2012-11-25 DIAGNOSIS — A419 Sepsis, unspecified organism: Principal | ICD-10-CM

## 2012-11-25 LAB — GLUCOSE, CAPILLARY: Glucose-Capillary: 69 mg/dL — ABNORMAL LOW (ref 70–99)

## 2012-11-25 NOTE — Clinical Documentation Improvement (Signed)
THIS DOCUMENT IS NOT A PERMANENT PART OF THE MEDICAL RECORD  Please update your documentation with the medical record to reflect your response to this query. If you need help knowing how to do this please call [phone].  11/25/12   Dear Dr. Irene Limbo / Associates,  A review of the patient medical record has revealed the following indicators.  Based on your clinical judgment, please clarify and document in a progress note and/or discharge summary the clinical condition associated with the following supporting information:   Admitted with sepsis, encephalopathy, respiratory failure, CHF, and Adult failure to thrive Comfort care  Per RD follow-up note 11/24/12 NUTRITION DIAGNOSIS:  1.Severe Malnutrition in the context of acute and chronic illness given pt oral intake </= 50% for >/= 5 days and moderate depletion of body fat and muscle (orbital and temporal wasting)  2.Inadequate oral intake related to swallow difficulty, Chronic disease progression as evidenced by gangrene of right foot, dysphagia and dementia  Goal: Meet nutrition needs as able  Monitor: Po intake, labs and wt trends  She meets criteria for severe malnutrition in the context of acute illness given her NPO status x 5 days and moderate depletion of fat and muscle  Height: 5'2"  Weight 90lbs  BMI  16.49    Possible Clinical Conditions?  Mild Malnutrition  Moderate Malnutrition Severe Malnutrition   Protein Calorie Malnutrition Severe Protein Calorie Malnutrition Other Condition________________ Cannot clinically determine    You may use possible, probable, or suspect with inpatient documentation. possible, probable, suspected diagnoses MUST be documented at the time of discharge  Reviewed: additional documentation in the medical record  Thank Angelene Giovanni RN Clinical Documentation Specialist: [phone] [facility] Health Information Management [place]

## 2012-11-25 NOTE — Progress Notes (Addendum)
[facility] PROGRESS NOTE  TIDA SANER JYN:[id number] DOB: Oct 11, 1921 DOA: 2012-12-07 PCP: Cassell Smiles., MD  Assessment/Plan: 1. Presumed "wet" gangrene right lower extremity secondary to chronic ischemia: Slowly progressing.  2. Suspected sepsis as above 3. Acute encephalopathy: Persistent, no change. She was noted to be obtunded on admission and nonverbal on subsequent followup visits. 4. Acute respiratory failure: This appears stable, oxygenation difficult to measure secondary to poor perfusion. 5. Diabetes mellitus with recurrent hypoglycemia: Severe hypoglycemia has been recurrent since admission. Suspect secondary to infection complicated by negligible oral intake. Hypoglycemia was also noted in the emergency Department 7/21. 6. Failure to thrive: Ominous. Negligible oral intake during this admission. 7. Dysphagia: High risk for aspiration. 8. Dementia 9. Chronic systolic congestive heart failure: Appears stable. 10. History of atrial flutter, sick sinus syndrome, coronary artery disease, low-grade ITP. 11. Severe Malnutrition in the context of acute and chronic illness   Comment: Discussed in detail at bedside with Dr [facility] power of attorney. We reviewed current diagnoses and challenges. Patient appears to be developing multiorgan dysfunction with oliguria. She remains obtunded, hypoxic, intermittently hypotensive, intermittently hypoglycemic and perfusion is poor. We discussed current treatment, daughter reiterates the focus should be on comfort. She is at peace and wishes to maintain full comfort care. She does not desire an IV or any new treatments. We discussed the possibility of returning to [facility] or considering hospice at home or in a residential facility and she will deliberate on this. At this point the patient appears to worsening each day in the hospital and death is anticipated soon. Full discussion had with patient's nurse Soledad Gerlach present.  [name]  does share that relations between her and her other family members are very difficult. As she is the healthcare power of attorney (per paper documentation on chart) I have encouraged her to set limits and boundaries with her family to prevent disruption or distress.   Full comfort care. In hospital death expected.  Code Status: DO NOT RESUSCITATE DVT prophylaxis: None Family Communication: As above Disposition Plan: Pending  Brendia Sacks, MD  [facility]  Pager [phone] If 7PM-7AM, please contact night-coverage at [email or web address], password [email or web address] 11/25/2012, 11:01 AM  LOS: 6 days   Clinical Summary: 79 year old woman with history of right lower extremity gangrene, no intervention recommended as well as complex past medical history presented to the emergency department for dehydration and altered mental status. She was admitted for treatment of suspected wet gangrene/sepsis, hypotension.  Hospitalized 09/2012 at [facility] for acute on chronic heart failure. During that hospitalization she developed right leg pain and was transferred to [facility] for suspected right leg arterial embolism. She was seen by vascular surgery and conservative measures directed towards comfort and hospice were suggested. During that hospitalization in June she was noted to be clearly developing gangrene of the right leg and he was also noted that she appeared to be chronically aspirating. Prognosis was noted to be poor at that time. She was noted to be minimally verbal at that time. Vascular surgery did not recommend palliative AKA in June because of lack of pain. Only comfort care should be offered per vascular surgery recommendations.   Consultants:    Procedures:  8/5 PICC line placement failed  Antibiotics:  Vancomycin 8/3 >> 8/7  Zosyn 8/3 >> 8/7  HPI/Subjective: No new issues. Patient has remained comfortable overnight.  Objective: Filed Vitals:   11/24/12 0650 11/24/12 1405 11/24/12 2135  11/25/12 0613  BP:  93/48 107/70 95/40  Pulse:  117 93 116  Temp: 97.4 F (36.3 C)  97.6 F (36.4 C) 97.6 F (36.4 C)  TempSrc: Axillary  Axillary Axillary  Resp: 15  16 14   Height:      Weight:      SpO2:   83% 89%    Intake/Output Summary (Last 24 hours) at 11/25/12 1101 Last data filed at 11/25/12 0618  Gross per 24 hour  Intake      0 ml  Output      0 ml  Net      0 ml     Filed Weights   12/17/2012 2030 11/20/12 0148  Weight: 39.009 kg (86 lb) 40.914 kg (90 lb 3.2 oz)    Exam:   Afebrile, intermittent hypotension noted. Tachycardic. Hypoxic.  General: Appears very calm and comfortable. No visible signs of discomfort. Obtunded. Does not respond to voice or tactile stimulation.  Cardiovascular: Tachycardic, regular. No murmur, rub, gallop. No lower extremity edema.   Respiratory: Clear to auscultation bilaterally. No wheezes, rales, rhonchi. Shallow inspiration  Abdomen: Soft, nontender, nondistended  Skin: No change in appearance of right lower extremity gangrene.   Data Reviewed:  No oral intake. No significant urine output.  Hypoglycemia recurrent.  Pending studies:   None  Scheduled Meds: . simvastatin  20 mg Oral QHS   Continuous Infusions:    Principal Problem:   Atherosclerotic peripheral vascular disease with gangrene Active Problems:   Coronary artery disease   Dementia   Acute respiratory failure with hypoxia   Sepsis   Acute encephalopathy   Time spent 35 minutes, greater then 50% in counseling and coordination of care

## 2012-11-25 NOTE — Progress Notes (Signed)
Nutrition Brief Note  Chart reviewed. Pt now transitioning to comfort care.  No further nutrition interventions warranted at this time.    Royann Shivers MS,RD,LDN,CSG Office: [phone] Pager: [phone]

## 2012-11-25 NOTE — Clinical Social Work Note (Signed)
Updated [facility] on patient status.  Santa Genera, LCSW Clinical Social Worker [phone])

## 2012-11-25 NOTE — Progress Notes (Signed)
Patients daughter refused the 2300 repositioning of the patient stating "don't turn her, she just got comfortable."

## 2012-11-26 NOTE — Progress Notes (Signed)
[facility] PROGRESS NOTE  [name] WUJ:[id number] DOB: 06/15/1921 DOA: 12/09/2012 PCP: Cassell Smiles., MD  Assessment/Plan: 1. 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 Suspected sepsis as above 3. Acute encephalopathy: Persistent, no change. She was noted to be obtunded on admission and nonverbal on subsequent followup visits. 4. Acute respiratory failure: This appears stable, oxygenation difficult to measure secondary to poor perfusion. 5. Diabetes mellitus with recurrent hypoglycemia: Severe hypoglycemia has been recurrent since admission. Suspect secondary to infection complicated by negligible oral intake.  6. Failure to thrive: Ominous. Negligible oral intake during this admission. 7. Dysphagia: High risk for aspiration. 8. Dementia 9. Chronic systolic congestive heart failure: Appears stable. 10. History of atrial flutter, sick sinus syndrome, coronary artery disease, low-grade ITP. 11. Severe malnutrition in the context of acute and chronic illness  Comment: Discussed with healthcare power of attorney [name] by telephone and updated. Based on our discussion we will continue current comfort measures, she would like the patient to remain in the hospital. Other family present at bedside--I have asked them to communicate with [name].    Full comfort care. In hospital death expected.  Code Status: DO NOT RESUSCITATE DVT prophylaxis: None Family Communication: As above Disposition Plan: Pending  Brendia Sacks, MD  [facility]  Pager [phone] If 7PM-7AM, please contact night-coverage at [email or web address], password [facility] 11/26/2012, 6:23 PM  LOS: 7 days   Clinical Summary: 79 year old woman with history of right lower extremity gangrene, no intervention recommended as well as complex past medical history presented to the emergency department for dehydration and altered mental status. She was admitted for treatment of  suspected wet gangrene/sepsis, hypotension.  Hospitalized 09/2012 at [facility] for acute on chronic heart failure. During that hospitalization she developed right leg pain and was transferred to [facility] for suspected right leg arterial embolism. She was seen by vascular surgery and conservative measures directed towards comfort and hospice were suggested. During that hospitalization in June she was noted to be clearly developing gangrene of the right leg and he was also noted that she appeared to be chronically aspirating. Prognosis was noted to be poor at that time. She was noted to be minimally verbal at that time. Vascular surgery did not recommend palliative AKA in June because of lack of pain. Only comfort care should be offered per vascular surgery recommendations.   Consultants:    Procedures:  8/5 PICC line placement failed  Antibiotics:  Vancomycin 8/3 >> 8/7  Zosyn 8/3 >> 8/7  HPI/Subjective: Comfortable overnight. Remains obtunded.  Objective: Filed Vitals:   11/24/12 1405 11/24/12 2135 11/25/12 0613 11/25/12 2144  BP: 93/48 107/70 95/40   Pulse: 117 93 116 106  Temp:  97.6 F (36.4 C) 97.6 F (36.4 C)   TempSrc:  Axillary Axillary   Resp:  16 14 10   Height:      Weight:      SpO2:  83% 89%     Intake/Output Summary (Last 24 hours) at 11/26/12 1823 Last data filed at 11/25/12 2251  Gross per 24 hour  Intake      0 ml  Output      0 ml  Net      0 ml     Filed Weights   12/09/2012 2030 11/20/12 0148  Weight: 39.009 kg (86 lb) 40.914 kg (90 lb 3.2 oz)    Exam:   General: Patient appears very calm. She has some spontaneous movement of her limbs but does not  respond to commands. She is obtunded. She appears to be dying.  Cardiovascular: Irregular rate. Normal rhythm.  Respiratory: Clear to auscultation bilaterally. Decreased breath sounds. Poor air movement. No frank wheezes, rales, rhonchi.  Right lower extremity dressed, black.   Data  Reviewed:  No oral intake.   Pending studies:   None  Scheduled Meds: . simvastatin  20 mg Oral QHS   Continuous Infusions:    Principal Problem:   Atherosclerotic peripheral vascular disease with gangrene Active Problems:   Coronary artery disease   Dementia   Acute respiratory failure with hypoxia   Sepsis   Acute encephalopathy   Time spent 25 minutes, greater then 50% in counseling and coordination of care

## 2012-11-26 NOTE — Progress Notes (Signed)
Pt POA family member requested vital signs be taken and CBG.  Vitals signs taken and patients family reeducated about comfort care. Offered to call On call hospitalist for clarification and further education but POA family member refused. Patient repositioned per family member request and pain meds given.

## 2012-11-26 NOTE — Progress Notes (Signed)
Met with patient's daughter, [name],  and Dr. Irene Limbo for palliative care discussion.  Patient's daughter was concerned and asked questions regarding replacing the IV and nourishment.  MD addressed these issues with daughter and both were agreeable that comfort care was the best choice at this point due to patient's deteriorating health.  Daughter also expressed concerns that there was tension within the family and that she would like for the nursing staff to assist her with keeping that tension away for her mother during this time.  Reassured daughter we would support her and her mother during this time.  [place], Candelaria Stagers

## 2012-11-27 NOTE — Plan of Care (Signed)
Problem: Phase I Progression Outcomes Goal: Other Phase I Outcomes/Goals Talked with family about end of life expectations.

## 2012-11-27 NOTE — Progress Notes (Signed)
[facility] PROGRESS NOTE  [name]:[id number] DOB: July 03, 1921 DOA: 12/08/2012 PCP: Cassell Smiles., MD  Assessment/Plan: 1. 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 Suspected sepsis as above 3. Acute encephalopathy: Persistent, no change. She was noted to be obtunded on admission and nonverbal on subsequent followup visits. 4. Acute respiratory failure: This appears stable, oxygenation difficult to measure secondary to poor perfusion. 5. Diabetes mellitus with recurrent hypoglycemia: Severe hypoglycemia has been recurrent since admission. Suspect secondary to infection complicated by negligible oral intake.  6. Failure to thrive: Negligible oral intake during this admission. 7. Dysphagia: High risk for aspiration. 8. Dementia 9. Chronic systolic congestive heart failure 10. History of atrial flutter, sick sinus syndrome, coronary artery disease, low-grade ITP. 11. Severe malnutrition in the context of acute and chronic illness  Comment: Discussed with healthcare power of attorney [name] at bedside. She tells me that God revealed to her that it is her mother's desire to pass from this world up to heaven. [name] is at peace with this and wants her mother to be kept comfortable, without further intervention.   Full comfort care. In hospital death expected.  Code Status: DO NOT RESUSCITATE DVT prophylaxis: None Family Communication: As above Disposition Plan: Pending  Brendia Sacks, MD  [facility]  Pager [phone] If 7PM-7AM, please contact night-coverage at [email or web address], password [facility] 11/27/2012, 8:06 AM  LOS: 8 days   Clinical Summary: 79 year old woman with history of right lower extremity gangrene, no intervention recommended as well as complex past medical history presented to the emergency department for dehydration and altered mental status. She was admitted for treatment of suspected wet gangrene/sepsis,  hypotension.  Hospitalized 09/2012 at [facility] for acute on chronic heart failure. During that hospitalization she developed right leg pain and was transferred to [facility] for suspected right leg arterial embolism. She was seen by vascular surgery and conservative measures directed towards comfort and hospice were suggested. During that hospitalization in June she was noted to be clearly developing gangrene of the right leg and he was also noted that she appeared to be chronically aspirating. Prognosis was noted to be poor at that time. She was noted to be minimally verbal at that time. Vascular surgery did not recommend palliative AKA in June because of lack of pain. Only comfort care should be offered per vascular surgery recommendations.   Consultants:    Procedures:  8/5 PICC line placement failed  Antibiotics:  Vancomycin 8/3 >> 8/7  Zosyn 8/3 >> 8/7  HPI/Subjective: Had some pain earlier but now resting comfortably.  Objective: Filed Vitals:   11/25/12 2144 11/26/12 1955 11/26/12 2245 11/27/12 0719  BP:   83/50   Pulse: 106  60   Temp:      TempSrc:      Resp: 10 12 12    Height:      Weight:      SpO2:   90% 89%    Intake/Output Summary (Last 24 hours) at 11/27/12 0806 Last data filed at 11/26/12 2000  Gross per 24 hour  Intake      0 ml  Output      0 ml  Net      0 ml     Filed Weights   11/21/2012 2030 11/20/12 0148  Weight: 39.009 kg (86 lb) 40.914 kg (90 lb 3.2 oz)    Exam:   General: Appears very calm and comfortable, lying on left side. She does not awaken to voice. No  response to sternal rub.  Cardiovascular tachycardic, regular. Bilateral upper extremity edema.  Respiratory clear to auscultation, diminished breath sounds with poor air movement, shallow breathing.  Abdomen soft, nondistended  Right lower extremity without change   Data Reviewed:  No oral intake.   Pending studies:   None  Scheduled Meds: . simvastatin  20 mg Oral  QHS   Continuous Infusions:    Principal Problem:   Atherosclerotic peripheral vascular disease with gangrene Active Problems:   Coronary artery disease   Dementia   Acute respiratory failure with hypoxia   Sepsis   Acute encephalopathy   Time spent 20 minutes, greater then 50% in counseling and coordination of care

## 2012-11-29 LAB — GLUCOSE, CAPILLARY

## 2012-12-19 NOTE — Discharge Summary (Signed)
Death Summary  [name] WGN:[id number] DOB: 30-Oct-1921 DOA: 29-Nov-2012  PCP: Cassell Smiles., MD PCP/Office Notified: Dr. Sherwood Gambler out of the office this week  Admit date: 11-29-2012 Date of Death: 2012-12-08  Final Diagnoses:  1. Presumed wet gangrene right lower extremity secondary to chronic ischemia 2. Suspected sepsis secondary to gangrene 3. Acute encephalopathy 4. Acute respiratory failure with hypoxia 5. Failure to thrive 6. Diabetes mellitus with hypoglycemia 7. Dysphagia 8. Severe malnutrition in context of acute and chronic illness  History of present illness:  79 year old woman with history of right lower extremity gangrene, no intervention recommended as well as complex past medical history presented to the emergency department for dehydration and altered mental status. She was admitted for treatment of suspected wet gangrene/sepsis, hypotension.   Hospitalized 09/2012 at [facility] for acute on chronic heart failure. During that hospitalization she developed right leg pain and was transferred to [facility] for suspected right leg arterial embolism. She was seen by vascular surgery and conservative measures directed towards comfort and hospice were suggested. During that hospitalization in June she was noted to be clearly developing gangrene of the right leg and it was also noted that she appeared to be chronically aspirating. Prognosis was noted to be poor at that time. She was noted to be minimally verbal at that time. Vascular surgery did not recommend palliative AKA in June because of lack of pain. Only comfort care should be offered per vascular surgery recommendations.   Hospital Course:  [name] was admitted for dehydration, IV fluids, suspected wet gangrene right lower extremity with sepsis. She remained obtunded during her hospitalization despite fluids, dextrose and antibiotics. Episodic hypoglycemia was noted but even with normal blood sugars maintained on dextrose  infusion her mentation remained unaltered. Oral intake was negligible and patient never became responsive. Extensive discussions were carried out daily with healthcare power of attorney daughter [name] (paperwork on file) and initially antibiotics and fluids were continued. PICC line placement was attempted but because of technical difficulties could not be obtained. Plan to transfer to [facility] for PICC line placement was arranged but on that day she was profoundly hypoglycemic with worsening respiratory failure and high oxygen requirement requiring high flow oxygen. It was felt that transfer even for routine procedure was high risk and in further discussion with healthcare power of attorney this was deferred. Unfortunately patient continued to decline, remained obtunded with no oral intake, decreasing urine output and evidence of multiorgan failure. After further discussion healthcare power of attorney expressed a desire for full comfort care and the discontinuation of curative treatment. The patient was made full comfort care and died 12/09/22.   Time: 20 minutes  Signed:  Brendia Sacks, MD  [facility] Dec 08, 2012, 12:51 PM

## 2012-12-19 NOTE — Care Management Note (Addendum)
    Page 1 of 1   12/14/2012     9:03:19 AM   CARE MANAGEMENT NOTE 12/03/2012  Patient:  Dawn, SOUTAR [name]   Account Number:  [id number]  Date Initiated:  12/23/12  Documentation initiated by:  Rosemary Holms  Subjective/Objective Assessment:   Pt admitted from with current poor prognosis. CM will continue to follow for DC assistance. Family to meet for discussion on goals of care.     Action/Plan:   Anticipated DC Date:     Anticipated DC Plan:  EXPIRED      DC Planning Services  CM consult      Choice offered to / List presented to:             Status of service:  Completed, signed off Medicare Important Message given?   (If response is "NO", the following Medicare IM given date fields will be blank) Date Medicare IM given:   Date Additional Medicare IM given:    Discharge Disposition:  EXPIRED  Per UR Regulation:    If discussed at Long Length of Stay Meetings, dates discussed:   11/24/2012    Comments:  11/24/12 Rosemary Holms RN BSN CM Anticipate hospital death within 24 hrs.  12/23/12 Rosemary Holms RN BSN CM

## 2012-12-19 NOTE — Progress Notes (Signed)
Patient cleaned and placed in body bag, family aware.  Bed control called to request funeral home ([employer]).  Patients family left when funeral home arrived.

## 2012-12-19 DEATH — deceased

## 2013-02-14 ENCOUNTER — Other Ambulatory Visit (HOSPITAL_COMMUNITY): Payer: Medicare Other

## 2013-05-17 ENCOUNTER — Other Ambulatory Visit (HOSPITAL_COMMUNITY): Payer: Medicare Other

## 2013-08-09 NOTE — Telephone Encounter (Signed)
Encounter has been closed--TP 08/09/13
